# Patient Record
Sex: Male | Born: 1990 | Race: Black or African American | Hispanic: No | Marital: Single | Smoking: Current some day smoker
Health system: Southern US, Community
[De-identification: ages and names within clinical notes are randomized; demographics above are authoritative.]

## PROBLEM LIST (undated history)

## (undated) VITALS — BP 104/83 | HR 59 | Temp 98.9°F | Resp 16

## (undated) DIAGNOSIS — F209 Schizophrenia, unspecified: Secondary | ICD-10-CM

## (undated) DIAGNOSIS — S82142A Displaced bicondylar fracture of left tibia, initial encounter for closed fracture: Secondary | ICD-10-CM

## (undated) DIAGNOSIS — R7989 Other specified abnormal findings of blood chemistry: Secondary | ICD-10-CM

## (undated) HISTORY — PX: APPENDECTOMY: SHX54

## (undated) HISTORY — PX: NO PAST SURGERIES: SHX2092

---
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
## 2012-08-28 ENCOUNTER — Emergency Department (HOSPITAL_COMMUNITY)
Admission: EM | Admit: 2012-08-28 | Discharge: 2012-08-29 | Disposition: A | Discharge status: Other Acute Inpatient Hospital | Payer: Self-pay | Attending: Emergency Medicine | Admitting: Emergency Medicine

## 2012-08-28 ENCOUNTER — Encounter (HOSPITAL_COMMUNITY): Payer: Self-pay | Admitting: *Deleted

## 2012-08-28 DIAGNOSIS — F17200 Nicotine dependence, unspecified, uncomplicated: Secondary | ICD-10-CM | POA: Insufficient documentation

## 2012-08-28 DIAGNOSIS — R440 Auditory hallucinations: Secondary | ICD-10-CM

## 2012-08-28 DIAGNOSIS — F1210 Cannabis abuse, uncomplicated: Secondary | ICD-10-CM | POA: Insufficient documentation

## 2012-08-28 DIAGNOSIS — R443 Hallucinations, unspecified: Secondary | ICD-10-CM | POA: Insufficient documentation

## 2012-08-28 DIAGNOSIS — F911 Conduct disorder, childhood-onset type: Secondary | ICD-10-CM | POA: Insufficient documentation

## 2012-08-28 DIAGNOSIS — R456 Violent behavior: Secondary | ICD-10-CM

## 2012-08-28 LAB — RAPID URINE DRUG SCREEN, HOSP PERFORMED
Amphetamines: NOT DETECTED
Barbiturates: NOT DETECTED
Benzodiazepines: NOT DETECTED
Cocaine: NOT DETECTED
Opiates: NOT DETECTED
Tetrahydrocannabinol: NOT DETECTED

## 2012-08-28 LAB — ETHANOL: Alcohol, Ethyl (B): <11 mg/dL (ref 0–11)

## 2012-08-28 LAB — COMPREHENSIVE METABOLIC PANEL
ALT: 18 U/L (ref 0–53)
AST: 17 U/L (ref 0–37)
Albumin: 4.5 g/dL (ref 3.5–5.2)
Alkaline Phosphatase: 189 U/L — ABNORMAL HIGH (ref 39–117)
BUN: 11 mg/dL (ref 6–23)
CO2: 27 mEq/L (ref 19–32)
Calcium: 9.8 mg/dL (ref 8.4–10.5)
Chloride: 102 mEq/L (ref 96–112)
Creatinine, Ser: 0.49 mg/dL — ABNORMAL LOW (ref 0.50–1.35)
GFR calc Af Amer: >90 mL/min (ref >90)
GFR calc non Af Amer: >90 mL/min (ref >90)
Glucose, Bld: 91 mg/dL (ref 70–99)
Potassium: 3.6 mEq/L (ref 3.5–5.1)
Sodium: 140 mEq/L (ref 135–145)
Total Bilirubin: 0.3 mg/dL (ref 0.3–1.2)
Total Protein: 7.6 g/dL (ref 6.0–8.3)

## 2012-08-28 LAB — CBC
HCT: 38.6 % — ABNORMAL LOW (ref 39.0–52.0)
Hemoglobin: 13.4 g/dL (ref 13.0–17.0)
MCH: 29.3 pg (ref 26.0–34.0)
MCHC: 34.7 g/dL (ref 30.0–36.0)
MCV: 84.5 fL (ref 78.0–100.0)
Platelets: 288 10*3/uL (ref 150–400)
RBC: 4.57 MIL/uL (ref 4.22–5.81)
RDW: 12.1 % (ref 11.5–15.5)
WBC: 5.2 10*3/uL (ref 4.0–10.5)

## 2012-08-28 MED ORDER — ALUM & MAG HYDROXIDE-SIMETH 200-200-20 MG/5ML PO SUSP: 30.0000 mL | ORAL | Status: DC | PRN

## 2012-08-28 MED ORDER — ACETAMINOPHEN 325 MG PO TABS: 650.0000 mg | ORAL_TABLET | ORAL | Status: DC | PRN

## 2012-08-28 MED ORDER — RISPERIDONE 0.5 MG PO TABS
0.5000 mg | ORAL_TABLET | Freq: Every day | ORAL | Status: DC
  Administered 2012-08-28 – 2012-08-29 (×2): 0.5 mg via ORAL
  Filled 2012-08-28 (×2): qty 1

## 2012-08-28 MED ORDER — NICOTINE 7 MG/24HR TD PT24
7.0000 mg | MEDICATED_PATCH | Freq: Every day | TRANSDERMAL | Status: DC | PRN
  Filled 2012-08-28: qty 1

## 2012-08-28 MED ORDER — ZIPRASIDONE MESYLATE 20 MG IM SOLR: 10.0000 mg | Freq: Four times a day (QID) | INTRAMUSCULAR | Status: DC | PRN

## 2012-08-28 NOTE — ED Notes (Signed)
Pt up talking on the phone

## 2012-08-28 NOTE — ED Provider Notes (Signed)
History   This chart was scribed for [name], MD by [name], ED Scribe. The patient was seen in room TR09C/TR09C and the patient's care was started at 12:48PM.    CSN: [id number]  Arrival date & time 08/28/12  1159   None     Chief Complaint  Patient presents with  . Medical Clearance    (Consider location/radiation/quality/duration/timing/severity/associated sxs/prior treatment) The history is provided by the patient. No language interpreter was used.   [name] [name] is a 21 y.o. male who presents to the Emergency Department requesting medical clearance for his hallucinations today. He reports hearing voices for the past year. He came in today because he was playing video games and he "just lost it". He reports the voices have not gotten worse but he has more violent outbursts. He reports that the voices sounds like he can hear another's person thoughts or they are trying to control him. He has never been to a doctor or sought out professional help. He reports good sleep, normal appetite and fluid intake compared to baseline. He reports his twin brother has similar symptoms; his brother has been to the ED, was given medication, and was able to control it with medication. He denies the voices are telling him to harm himself or others and denies suicidal ideation or homicidal ideation. He reports he quit smoking marijuana 3 days ago. He used to smoke it everyday but just stopped. Smoking marijuana makes the voices worse. He does not regularly use any other drugs other than alcohol; he drinks socially at parties. He smokes cigarettes about a pack a day. Denies HA, visual deficits, fever, neck pain, sore throat, rash, back pain, CP, SOB, abdominal pain, nausea, emesis, diarrhea, dysuria, or extremity pain, edema, weakness, numbness, or tingling. 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 He reports he used to weigh more in the past but has lost some weight. No known  allergies. No other pertinent medical symptoms.  PCP: in [place]   History reviewed. No pertinent past medical history.  History reviewed. No pertinent past surgical history.  History reviewed. No pertinent family history.  Social History: He reports living intermittently between here in [place] with his father and [place] with his mother.  History  Substance Use Topics  . Smoking status: Current Every Day Smoker    Types: Cigarettes  . Smokeless tobacco: Not on file  . Alcohol Use: No      Review of Systems 10 Systems reviewed and are negative for acute change except as noted in the HPI.  Allergies  Review of patient's allergies indicates no known allergies.  Home Medications  No current outpatient prescriptions on file.  BP 109/97  Pulse 94  Temp 97.5 F (36.4 C) (Oral)  Resp 20  SpO2 100%  Physical Exam  Nursing notes reviewed.  Electronic medical record reviewed. VITAL SIGNS:   Filed Vitals:   08/28/12 1211 08/28/12 1838  BP: 109/97 113/77  Pulse: 94 78  Temp: 97.5 F (36.4 C) 98.2 F (36.8 C)  TempSrc: Oral Oral  Resp: 20 18  SpO2: 100% 100%   CONSTITUTIONAL: Awake, oriented, appears non-toxic HENT: Atraumatic, normocephalic, oral mucosa pink and moist, airway patent. Nares patent without drainage. External ears normal. EYES: Conjunctiva clear, EOMI, PERRLA NECK: Trachea midline, non-tender, supple CARDIOVASCULAR: Normal heart rate, Normal rhythm, No murmurs, rubs, gallops PULMONARY/CHEST: Clear to auscultation, no rhonchi, wheezes, or rales. Symmetrical breath sounds. Non-tender. ABDOMINAL: Non-distended, soft, non-tender - no rebound or guarding.  BS normal. NEUROLOGIC: Non-focal, moving all four extremities, no gross sensory or motor deficits. EXTREMITIES: No clubbing, cyanosis, or edema SKIN: Warm, Dry, No erythema, No rash  ED Course  Procedures (including critical care time)  DIAGNOSTIC STUDIES:  COORDINATION OF CARE:  12:53PM -  ACT team and telepsych will be ordered for [name] [name]. He will be moved to CDU.  Labs Reviewed  CBC - Abnormal; Notable for the following:    HCT 38.6 (*)     All other components within normal limits  COMPREHENSIVE METABOLIC PANEL  ETHANOL  URINE RAPID DRUG SCREEN (HOSP PERFORMED)   No results found.   No diagnosis found.    MDM  [name] [name] is a 21 y.o. male presenting with concern for schizophrenia. Patient will need formal tele-psych evaluation.  Patient is medically clear will be transferred to pod C. for further evaluation.  Discussed with the act team.  I personally performed the services described in this documentation, which was scribed in my presence. The recorded information has been reviewed and is accurate. [name], M.D.          [name], MD 08/28/12 2023

## 2012-08-28 NOTE — ED Notes (Signed)
Pt reports hearing voices x 1 year and "just wants them to go away." denies voices telling him to harm himself or others, denies SI or HI. Calm and cooperative at triage.

## 2012-08-28 NOTE — ED Notes (Signed)
The pt given meds

## 2012-08-28 NOTE — BH Assessment (Signed)
Assessment Note   [name] [name] is an 21 y.o. male presenting with AH without command and paranoid delusions.  Pt states he has been hearing "whispers" for the last year and fells that people are watching him and talking about him.  Pt denies SI/HI, VH at present.  Pt denies prior hx of MH tx.  Pt endorsing occasionally using THC and ETOH, specifically on social occasions.  Pt states the last time he used THC was 3 days ago and he "is trying to quit using weed".  Pt calm and cooperative during the assessment.  Pt presented with blunted, suspicious affect with unremarkable mood.  Pt alert and oriented x3.  Pt endorses living with his father and older sister while working full time in a [employer].  Pt's records indicate he has moved back and forth from [place] a number of times.  Pt states he has problems with his anger and feels like he "wants to fight a lot".  Pt denies any legal involvement.  Pt also endorses high energy levels but not change in mood (no elation or extreme happiness).  Telepsych completed, recommending inpatient care.  Referral made to [facility].    Axis I: Psychotic Disorder NOS and Rule Out Bipolar Disorder Axis II: Deferred Axis III:  Past Medical History  Diagnosis Date  . Mental disorder    Axis IV: housing problems, other psychosocial or environmental problems and problems related to social environment Axis V: 21-30 behavior considerably influenced by delusions or hallucinations OR serious impairment in judgment, communication OR inability to function in almost all areas  Past Medical History: History reviewed. No pertinent past medical history.  History reviewed. No pertinent past surgical history.  Family History: History reviewed. No pertinent family history.  Social History:  reports that he has been smoking Cigarettes.  He does not have any smokeless tobacco history on file. He reports that he drinks alcohol. He reports that he uses illicit drugs  (Marijuana).  Additional Social History:  Alcohol / Drug Use History of alcohol / drug use?: Yes Substance #1 Name of Substance 1: THC 1 - Age of First Use: 19 1 - Amount (size/oz): varies 1 - Frequency: varies 1 - Duration: unk 1 - Last Use / Amount: 3 days  CIWA: CIWA-Ar BP: 109/97 mmHg Pulse Rate: 94  COWS:    Allergies: No Known Allergies  Home Medications:  (Not in a hospital admission)  OB/GYN Status:  No LMP for male patient.  General Assessment Data Location of Assessment: [facility] ED ACT Assessment: Yes Living Arrangements: Parent Can pt return to current living arrangement?: Yes Admission Status: Voluntary Is patient capable of signing voluntary admission?: Yes Transfer from: Home Referral Source: Self/Family/Friend     Risk to self Suicidal Ideation: No Suicidal Intent: No Is patient at risk for suicide?: No Suicidal Plan?: No Access to Means: No What has been your use of drugs/alcohol within the last 12 months?: THC usage Previous Attempts/Gestures: No How many times?: 0  Other Self Harm Risks: psychosis Triggers for Past Attempts: None known Intentional Self Injurious Behavior: None Family Suicide History: No Recent stressful life event(s): Other (Comment) (none noted) Persecutory voices/beliefs?: No Depression: No Depression Symptoms:  (none noted) Substance abuse history and/or treatment for substance abuse?: Yes (THC) Suicide prevention information given to non-admitted patients: Not applicable  Risk to Others Homicidal Ideation: No-Not Currently/Within Last 6 Months Thoughts of Harm to Others: No-Not Currently Present/Within Last 6 Months Current Homicidal Intent: No-Not Currently/Within Last 6 Months Current  Homicidal Plan: No-Not Currently/Within Last 6 Months Access to Homicidal Means: No Identified Victim: none History of harm to others?: No Assessment of Violence: None Noted Violent Behavior Description: fighting in distant past Does  patient have access to weapons?: No Criminal Charges Pending?: No Does patient have a court date: No  Psychosis Hallucinations: Auditory (whispers for the last year) Delusions: Persecutory (feeling that people are watching/talking about him)  Mental Status Report Appear/Hygiene: Improved Eye Contact: Fair Motor Activity: Unremarkable Speech: Logical/coherent Level of Consciousness: Alert Mood: Preoccupied Affect: Blunted;Preoccupied Anxiety Level: None Thought Processes: Coherent;Relevant Judgement: Impaired Orientation: Person;Place;Situation;Time Obsessive Compulsive Thoughts/Behaviors: Moderate  Cognitive Functioning Concentration: Decreased Memory: Recent Intact;Remote Intact IQ: Average Insight: Fair Impulse Control: Poor Appetite: Fair Weight Loss: 0  Weight Gain: 0  Sleep: Decreased Total Hours of Sleep: 6  Vegetative Symptoms: None  ADLScreening [facility] Assessment Services) Patient's cognitive ability adequate to safely complete daily activities?: Yes Patient able to express need for assistance with ADLs?: Yes Independently performs ADLs?: Yes (appropriate for developmental age)  Abuse/Neglect [facility]) Physical Abuse: Denies Verbal Abuse: Denies Sexual Abuse: Denies  Prior Inpatient Therapy Prior Inpatient Therapy: No Prior Therapy Dates: none Prior Therapy Facilty/Provider(s): none Reason for Treatment: none  Prior Outpatient Therapy Prior Outpatient Therapy: No Prior Therapy Dates: none Prior Therapy Facilty/Provider(s): none Reason for Treatment: none  ADL Screening (condition at time of admission) Patient's cognitive ability adequate to safely complete daily activities?: Yes Patient able to express need for assistance with ADLs?: Yes Independently performs ADLs?: Yes (appropriate for developmental age)       Abuse/Neglect Assessment (Assessment to be complete while patient is alone) Physical Abuse: Denies Verbal Abuse: Denies Sexual Abuse:  Denies Exploitation of patient/patient's resources: Denies Self-Neglect: Denies Values / Beliefs Cultural Requests During Hospitalization: None Spiritual Requests During Hospitalization: None        Additional Information 1:1 In Past 12 Months?: No CIRT Risk: No Elopement Risk: No Does patient have medical clearance?: Yes     Disposition:  Disposition Disposition of Patient: Referred to Patient referred to: Other (Comment) [name])  On Site Evaluation by:   Reviewed with Physician:     Ena Dawley Pate 08/28/2012 5:03 PM

## 2012-08-29 ENCOUNTER — Encounter (HOSPITAL_COMMUNITY): Payer: Self-pay | Admitting: *Deleted

## 2012-08-29 ENCOUNTER — Inpatient Hospital Stay (HOSPITAL_COMMUNITY)
Admission: AD | Admit: 2012-08-29 | Discharge: 2012-09-01 | DRG: 897 | Disposition: A | Discharge status: Home / Self Care | Payer: Federal, State, Local not specified - Other | Source: Ambulatory Visit | Attending: Psychiatry | Admitting: Psychiatry

## 2012-08-29 DIAGNOSIS — F1910 Other psychoactive substance abuse, uncomplicated: Secondary | ICD-10-CM

## 2012-08-29 DIAGNOSIS — F19950 Other psychoactive substance use, unspecified with psychoactive substance-induced psychotic disorder with delusions: Principal | ICD-10-CM | POA: Diagnosis present

## 2012-08-29 DIAGNOSIS — F1920 Other psychoactive substance dependence, uncomplicated: Secondary | ICD-10-CM | POA: Diagnosis present

## 2012-08-29 NOTE — ED Provider Notes (Signed)
Pt alert, content, vitals normal. Discussed w act team, awaiting placement.   Suzi Roots, MD 08/29/12 1004

## 2012-08-29 NOTE — BH Assessment (Signed)
[facility] Assessment Progress Note      Pt was accepted to [facility] by Donell Sievert to Dr Jannifer Franklin.  Room 401-2.  Support paperwork complete.  ED staff notified.

## 2012-08-29 NOTE — BH Assessment (Signed)
Assessment Note   [name] [name] is an 21 y.o. male that was reassessed this day.  Pt continues to endorse psychosis (auditory hallucinations).  Pt denies SI or HI.  Pt pleasant and cooperative and just took a shower.  Pt asked questions about his care and writer explained the process to the pt.  Pt did appear al little apprehensive and suspicious, but was calm and cooperative.  Telepsych recommended inpatient care.  There are no appropriate beds at [facility] tonight.  [place], no beds per [place] at 1554.  Called [facility] and per [place] @ 1550, beds available so referral faxed for review.  Called [place] and no beds per [place] at 1553.  Called [facility], and no indigent [place] beds per [employer] at 1553.  Called [facility] and left message at 1802.  Called [facility] and per Lupita Leash at 1800, beds avaialable, so referral faxed for review.  Oncoming staff to follow up with referrals.  Completed reassessment, assessment notification and faxed to [facility] to log.  Updated ED staff.  Previous Note:  [name] [name] is an 21 y.o. male presenting with AH without command and paranoid delusions. Pt states he has been hearing "whispers" for the last year and fells that people are watching him and talking about him. Pt denies SI/HI, VH at present. Pt denies prior hx of MH tx. Pt endorsing occasionally using THC and ETOH, specifically on social occasions. Pt states the last time he used THC was 3 days ago and he "is trying to quit using weed". Pt calm and cooperative during the assessment. Pt presented with blunted, suspicious affect with unremarkable mood. Pt alert and oriented x3. Pt endorses living with his father and older sister while working full time in a [employer]. Pt's records indicate he has moved back and forth from [place] a number of times. Pt states he has problems with his anger and feels like he "wants to fight a lot". Pt denies any legal involvement. Pt also endorses high energy levels but not change in mood (no elation or  extreme happiness). Telepsych completed, recommending inpatient care. Referral made to [facility].   Axis I: 298.9 Psychotic Disorder NOS Axis II: Deferred Axis III:  Past Medical History  Diagnosis Date  . Mental disorder    Axis IV: housing problems, other psychosocial or environmental problems, problems related to social environment and problems with access to health care services Axis V: 21-30 behavior considerably influenced by delusions or hallucinations OR serious impairment in judgment, communication OR inability to function in almost all areas  Past Medical History:  Past Medical History  Diagnosis Date  . Mental disorder     Past Surgical History  Procedure Date  . No past surgeries     Family History: History reviewed. No pertinent family history.  Social History:  reports that he has been smoking Cigarettes.  He does not have any smokeless tobacco history on file. He reports that he drinks alcohol. He reports that he uses illicit drugs (Marijuana).  Additional Social History:  Alcohol / Drug Use Pain Medications: see MAR Prescriptions: see MAR Over the Counter: see MAR History of alcohol / drug use?: Yes Longest period of sobriety (when/how long): unknown Negative Consequences of Use: Personal relationships Withdrawal Symptoms:  (pt denies) Substance #1 Name of Substance 1: THC 1 - Age of First Use: 19 1 - Amount (size/oz): varies 1 - Frequency: varies 1 - Duration: unk 1 - Last Use / Amount: 3 days  CIWA: CIWA-Ar BP: 115/74 mmHg Pulse  Rate: 67  COWS:    Allergies: No Known Allergies  Home Medications:  (Not in a hospital admission)  OB/GYN Status:  No LMP for male patient.  General Assessment Data Location of Assessment: [facility] ED ACT Assessment: Yes Living Arrangements: Parent Can pt return to current living arrangement?: Yes Admission Status: Voluntary Is patient capable of signing voluntary admission?: Yes Transfer from: Home Referral Source:  Self/Family/Friend  Education Status Is patient currently in school?: No  Risk to self Suicidal Ideation: No Suicidal Intent: No Is patient at risk for suicide?: No Suicidal Plan?: No Access to Means: No What has been your use of drugs/alcohol within the last 12 months?: THC use Previous Attempts/Gestures: No How many times?: 0  Other Self Harm Risks: psychosis Triggers for Past Attempts: None known Intentional Self Injurious Behavior: None Family Suicide History: No Recent stressful life event(s): Other (Comment) (none noted) Persecutory voices/beliefs?: No Depression: No Depression Symptoms:  (pt denies, none noted) Substance abuse history and/or treatment for substance abuse?: Yes (THC) Suicide prevention information given to non-admitted patients: Not applicable  Risk to Others Homicidal Ideation: No Thoughts of Harm to Others: No Current Homicidal Intent: No Current Homicidal Plan: No Access to Homicidal Means: No Identified Victim: pt denies History of harm to others?: No Assessment of Violence: None Noted Violent Behavior Description: Fighting in distant past Does patient have access to weapons?: No Criminal Charges Pending?: No Does patient have a court date: No  Psychosis Hallucinations: Auditory (whispers for the last year) Delusions: Persecutory (feeling people are watching/talking about him)  Mental Status Report Appear/Hygiene: Improved Eye Contact: Fair Motor Activity: Unremarkable Speech: Logical/coherent Level of Consciousness: Alert Mood: Apprehensive Affect: Blunted;Appropriate to circumstance Anxiety Level: None Thought Processes: Coherent;Relevant Judgement: Impaired Orientation: Person;Place;Situation;Time Obsessive Compulsive Thoughts/Behaviors: Minimal  Cognitive Functioning Concentration: Decreased Memory: Recent Intact;Remote Intact IQ: Average Insight: Fair Impulse Control: Poor Appetite: Fair Weight Loss: 0  Weight Gain: 0   Sleep: Decreased Total Hours of Sleep: 6  Vegetative Symptoms: None  ADLScreening [facility] Assessment Services) Patient's cognitive ability adequate to safely complete daily activities?: Yes Patient able to express need for assistance with ADLs?: Yes Independently performs ADLs?: Yes (appropriate for developmental age)  Abuse/Neglect [facility]) Physical Abuse: Denies Verbal Abuse: Denies Sexual Abuse: Denies  Prior Inpatient Therapy Prior Inpatient Therapy: No Prior Therapy Dates: none Prior Therapy Facilty/Provider(s): none Reason for Treatment: none  Prior Outpatient Therapy Prior Outpatient Therapy: No Prior Therapy Dates: none Prior Therapy Facilty/Provider(s): none Reason for Treatment: none  ADL Screening (condition at time of admission) Patient's cognitive ability adequate to safely complete daily activities?: Yes Patient able to express need for assistance with ADLs?: Yes Independently performs ADLs?: Yes (appropriate for developmental age) Weakness of Legs: None Weakness of Arms/Hands: None  Home Assistive Devices/Equipment Home Assistive Devices/Equipment: None    Abuse/Neglect Assessment (Assessment to be complete while patient is alone) Physical Abuse: Denies Verbal Abuse: Denies Sexual Abuse: Denies Exploitation of patient/patient's resources: Denies Self-Neglect: Denies Values / Beliefs Cultural Requests During Hospitalization: None Spiritual Requests During Hospitalization: None Consults Spiritual Care Consult Needed: No Social Work Consult Needed: No Merchant [employer] officer (For Healthcare) Advance Directive: Patient does not have advance directive;Patient would not like information    Additional Information 1:1 In Past 12 Months?: No CIRT Risk: No Elopement Risk: No Does patient have medical clearance?: Yes     Disposition:  Disposition Disposition of Patient: Referred to;Inpatient treatment program Type of inpatient treatment program:  Adult Patient referred to: Other (Comment) (Pending [facility],  Clotilde Dieter)  On Site Evaluation by:   Reviewed with Physician:  Valene Bors, Rennis Harding 08/29/2012 6:03 PM

## 2012-08-29 NOTE — ED Notes (Signed)
Breakfast tray given. °

## 2012-08-30 ENCOUNTER — Encounter (HOSPITAL_COMMUNITY): Payer: Self-pay | Admitting: Psychiatry

## 2012-08-30 DIAGNOSIS — F1910 Other psychoactive substance abuse, uncomplicated: Secondary | ICD-10-CM

## 2012-08-30 DIAGNOSIS — F19950 Other psychoactive substance use, unspecified with psychoactive substance-induced psychotic disorder with delusions: Principal | ICD-10-CM

## 2012-08-30 LAB — TSH: TSH: 2.905 u[IU]/mL (ref 0.350–4.500)

## 2012-08-30 MED ORDER — CHLORDIAZEPOXIDE HCL 25 MG PO CAPS: 25.0000 mg | ORAL_CAPSULE | Freq: Four times a day (QID) | ORAL | Status: DC | PRN

## 2012-08-30 MED ORDER — BENZTROPINE MESYLATE 0.5 MG PO TABS
0.5000 mg | ORAL_TABLET | Freq: Two times a day (BID) | ORAL | Status: DC
  Administered 2012-08-30 – 2012-09-01 (×4): 0.5 mg via ORAL
  Filled 2012-08-30 (×6): qty 1

## 2012-08-30 MED ORDER — TRAZODONE HCL 50 MG PO TABS: 50.0000 mg | ORAL_TABLET | Freq: Every evening | ORAL | Status: DC | PRN

## 2012-08-30 MED ORDER — HALOPERIDOL 2 MG PO TABS
2.0000 mg | ORAL_TABLET | Freq: Two times a day (BID) | ORAL | Status: DC
  Administered 2012-08-30 – 2012-09-01 (×4): 2 mg via ORAL
  Filled 2012-08-30 (×6): qty 1

## 2012-08-30 MED ORDER — NICOTINE 21 MG/24HR TD PT24
21.0000 mg | MEDICATED_PATCH | Freq: Every day | TRANSDERMAL | Status: DC
  Administered 2012-09-01: 21 mg via TRANSDERMAL
  Filled 2012-08-30 (×4): qty 1

## 2012-08-30 MED ORDER — MAGNESIUM HYDROXIDE 400 MG/5ML PO SUSP: 30.0000 mL | Freq: Every day | ORAL | Status: DC | PRN

## 2012-08-30 MED ORDER — ALUM & MAG HYDROXIDE-SIMETH 200-200-20 MG/5ML PO SUSP: 30.0000 mL | ORAL | Status: DC | PRN

## 2012-08-30 MED ORDER — RISPERIDONE 1 MG PO TABS
ORAL_TABLET | ORAL | Status: AC
  Filled 2012-08-30: qty 1

## 2012-08-30 MED ORDER — RISPERIDONE 1 MG PO TABS
1.0000 mg | ORAL_TABLET | Freq: Every day | ORAL | Status: DC
  Administered 2012-08-30: 1 mg via ORAL
  Filled 2012-08-30 (×2): qty 1

## 2012-08-30 MED ORDER — ACETAMINOPHEN 325 MG PO TABS: 650.0000 mg | ORAL_TABLET | Freq: Four times a day (QID) | ORAL | Status: DC | PRN

## 2012-08-30 NOTE — Progress Notes (Signed)
[facility] Group Notes:  (Counselor/Nursing/MHT/Case Management/Adjunct)  08/30/2012 6:52 PM  Type of Therapy:  Discharge Planning  Participation Level:  Minimal  Participation Quality:  Attentive  Affect:  Flat  Cognitive:  Oriented  Insight:  None shared  Engagement in Group:  Limited  Engagement in Therapy:  Limited  Modes of Intervention:  Exploration, Socialization and Support  Summary of Progress/Problems:  21 YO Patient shared that voices brought him into hospital and this was an entirely new experience for him. He reported no suicidal or homicidal ideation nor depression and rated his anxiety at a 4 on a scale of 1-10 with 10 being the highest.    Clide Dales 08/30/2012, 6:52 PM

## 2012-08-30 NOTE — Tx Team (Signed)
Initial Interdisciplinary Treatment Plan  PATIENT STRENGTHS: (choose at least two) Ability for insight Average or above average intelligence Communication skills General fund of knowledge Motivation for treatment/growth Physical Health Supportive family/friends Work skills  PATIENT STRESSORS: Substance abuse   PROBLEM LIST: Problem List/Patient Goals Date to be addressed Date deferred Reason deferred Estimated date of resolution  Substance Abuse 08-29-12     Auditory Hallucinations 08-29-12                                                DISCHARGE CRITERIA:  Improved stabilization in mood, thinking, and/or behavior Motivation to continue treatment in a less acute level of care Verbal commitment to aftercare and medication compliance  PRELIMINARY DISCHARGE PLAN: Attend PHP/IOP Outpatient therapy Participate in family therapy Return to previous living arrangement Return to previous work or school arrangements  PATIENT/FAMIILY INVOLVEMENT: This treatment plan has been presented to and reviewed with the patient, [name] [name], and/or family member.  The patient and family have been given the opportunity to ask questions and make suggestions.  Mickeal Needy 08/30/2012, 12:11 AM

## 2012-08-30 NOTE — [facility] Counselor (Signed)
Adult Comprehensive Assessment  Patient ID: [name] [name], male   DOB: 01-Jan-1991, 21 y.o.   MRN: [id number]  Information Source:    Current Stressors:  Educational / Learning stressors: N/A Employment / Job issues: Having difficulty at work due to hearing voices Family Relationships: N/A Surveyor, quantity / Lack of resources (include bankruptcy): Depends on parents for support as he is only working part Conservation officer, historic buildings / Lack of housing: Depends on parents for housing Physical health (include injuries & life threatening diseases): N/A Social relationships: N/A Substance abuse: Claims to be drug free for at least 2 weeks.  Admits that this has been a problem in the past, but blieves he is able to just stop based on the past 2 weeks Bereavement / Loss: N/A  Living/Environment/Situation:  Living Arrangements: Parent Living conditions (as described by patient or guardian): Good How long has patient lived in current situation?: 1 month  Has been back and forth between father here and mother in [place] multiple times since HS graduation What is atmosphere in current home: Comfortable;Supportive  Family History:  Marital status: Single Does patient have children?: No  Childhood History:  By whom was/is the patient raised?: Mother;Father Additional childhood history information: Lived with mother thru 8th grade, then he and twind moved in with father here for HS Description of patient's relationship with caregiver when they were a child: Good Patient's description of current relationship with people who raised him/her: Good Number of Siblings: 2  (Twin brother living with mother, 92 yo sister also with moth) Description of patient's current relationship with siblings: Good Did patient suffer any verbal/emotional/physical/sexual abuse as a child?: No Did patient suffer from severe childhood neglect?: No Has patient ever been sexually abused/assaulted/raped as an adolescent or adult?: No Was the  patient ever a victim of a crime or a disaster?: No Witnessed domestic violence?: No Has patient been effected by domestic violence as an adult?: No  Education:  Highest grade of school patient has completed: 12 Currently a Consulting civil engineer?: No Learning disability?: No  Employment/Work Situation:   Employment situation: Employed Where is patient currently employed?: [employer] How long has patient been employed?: 1 month Patient's job has been impacted by current illness: Yes Describe how patient's job has been implacted: Not able to concentrate as much as he would like due to hearing voices What is the longest time patient has a held a job?: 6 months Where was the patient employed at that time?: [employer] Has patient ever been in the [employer]?: No Has patient ever served in combat?: No  Financial Resources:   Financial resources: Income from employment;Support from parents / caregiver Does patient have a representative payee or guardian?: No  Alcohol/Substance Abuse:   What has been your use of drugs/alcohol within the last 12 months?: Experimented with Xanax, acid, Adderal, Mollies, Ecstacy, Weed Alcohol/Substance Abuse Treatment Hx: Denies past history Has alcohol/substance abuse ever caused legal problems?: Yes (No legal, but he was fired from the [employer] for substa)  Social Support System:   Patient's Community Support System: Good Describe Community Support System: Family, friends Type of faith/religion: Ephriam Knuckles How does patient's faith help to cope with current illness?: N/A  Leisure/Recreation:   Leisure and Hobbies: Skateboarding  Strengths/Needs:   What things does the patient do well?: Skateboarding, playing video games In what areas does patient struggle / problems for patient: Wants to be better skateboarder  Discharge Plan:   Does patient have access to transportation?: Yes Will patient be  returning to same living situation after discharge?:  Yes Currently receiving community mental health services: No If no, would patient like referral for services when discharged?: Yes (What county?) Medical sales representative)  Summary/Recommendations:   Summary and Recommendations (to be completed by the evaluator): [name] [name] is a 21 yo AA male who is here due to psychosis.  "For the most part, the voices are manageable.  I just want to see if there is a medication that takes them away completely."  Will benefit from medicaiton trial, therapuetic milieu, and follow up appointment in the community.  [name] [name]. 08/30/2012

## 2012-08-30 NOTE — H&P (Addendum)
Psychiatric Admission Assessment Adult  Patient Identification:  [name] [name] Date of Evaluation:  08/30/2012 Chief Complaint:  Psychotic Disorder NOS   298.9 History of Present Illness:Patient is a 68 year man with no prior history of mental illness who reports long history of polysubstance abuse(Mushroom, Cannabis,Molly, Spice, Adderall, Xanax etc). Patient presents with auditory hallucinations, paranois and violent outburst. He stated that he has been hearing  Voices for over a year after he started to smoke Marijuana and doing other drugs. He reports hearing voices in his head "trying to control my thoughts". Patient reports that he has tried to stop using drugs thinking that the voices will disappear without success. He is requesting for help to get rid of the voices in his head. Elements:  Location:  [facility] inpatient service. Quality:  hearing voices that has been impairing his daily function.. Severity:  voices are there all the time and becomes more severe when he uses drugs. Timing:  hearing voices and  feels more paranoid when using drugs.. Duration:  voices and paranoia started over a year ago.. Context:  voices started with drug use.. Associated Signs/Synptoms: Depression Symptoms:  denies (Hypo) Manic Symptoms:  Irritable Mood, Anxiety Symptoms:  None reported Psychotic Symptoms:  Delusions, Hallucinations: Auditory PTSD Symptoms: none reported.  Psychiatric Specialty Exam: Physical Exam  Psychiatric: He has a normal mood and affect. His speech is normal. He is agitated and actively hallucinating. Thought content is paranoid and delusional. Cognition and memory are normal. He expresses inappropriate judgment.  ROS: reviewed ROS, and CPE completed by MD in ED and evaluated the patient. I agree with those findings and feel no further physical exam is warranted at this time.  Review of Systems  Constitutional: Negative.   HENT: Negative.   Eyes: Negative.   Respiratory:  Negative.   Cardiovascular: Negative.   Gastrointestinal: Negative.   Genitourinary: Negative.   Musculoskeletal: Negative.   Skin: Negative.   Neurological: Negative.   Endo/Heme/Allergies: Negative.   Psychiatric/Behavioral: Positive for hallucinations.    Blood pressure 123/91, pulse 125, temperature 96.9 F (36.1 C), temperature source Oral, resp. rate 20, height 5\' 3"  (1.6 m), weight 68.947 kg (152 lb).Body mass index is 26.93 kg/(m^2).  General Appearance: Casual and Fairly Groomed  Patent attorney::  Good  Speech:  Clear and Coherent  Volume:  Normal  Mood:  neutral  Affect:  Restricted  Thought Process:  Linear and Logical  Orientation:  Full (Time, Place, and Person)  Thought Content:  Delusions and Hallucinations: Auditory  Suicidal Thoughts:  No  Homicidal Thoughts:  No  Memory:  Immediate;   Fair Recent;   Fair Remote;   Fair  Judgement:  Impaired  Insight:  Lacking  Psychomotor Activity:  Normal  Concentration:  Fair  Recall:  Fair  Akathisia:  No  Handed:  Right  AIMS (if indicated):     Assets:  Communication Skills Desire for Improvement Physical Health  Sleep:  Number of Hours: 4.5     Past Psychiatric History: Diagnosis:  Hospitalizations:  None  Outpatient Care:   None  Substance Abuse Care:  None  Self-Mutilation:  None  Suicidal Attempts:  None  Violent Behaviors:  None    Past Medical History:   Past Medical History  Diagnosis Date  . Mental disorder    None. Allergies:  No Known Allergies PTA Medications: No prescriptions prior to admission    Previous Psychotropic Medications:  Medication/Dose: none  Substance Abuse History in the last 12 months:  Yes. Patient endorses using marijuana daily for the past year with his last use being 1 month ago. He also reports trying LSD, Pills, Mollie, Adderall and Ecstasy.  He also reports having 1-2 beers several times a month.  Consequences of Substance  Abuse: NA Patient states marijuana made the voices worse.  Even though he last used a month ago, he states he was still feeling the affects of this when he came to the hospital. Social History:  reports that he has been smoking Cigarettes.  He does not have any smokeless tobacco history on file. He reports that he drinks alcohol. He reports that he uses illicit drugs (Marijuana). Additional Social History: Current Place of Residence:   Place of Birth:   Family Members: Marital Status:  Single Children:  Sons:  Daughters: Relationships: Patient does not recall his parents living together but thinks they split when he was 39 or 21 years old.  He has traveled back and forth between parents for the majority of his life. He moved to [facility] when he was in the 8th grade. Education:  HS Graduate [employer]. Educational Problems/Performance: Religious Beliefs/Practices: History of Abuse (Emotional/Phsycial/Sexual) Occupational Experiences; Military History:  None. Legal History: Hobbies/Interests:  Family History:   Family History  Problem Relation Age of Onset  . Other Mother   . Other Brother     Results for orders placed during the hospital encounter of 08/28/12 (from the past 72 hour(s))  CBC     Status: Abnormal   Collection Time   08/28/12 12:19 PM      Component Value Range Comment   WBC 5.2  4.0 - 10.5 K/uL    RBC 4.57  4.22 - 5.81 MIL/uL    Hemoglobin 13.4  13.0 - 17.0 g/dL    HCT 16.1 (*) 09.6 - 52.0 %    MCV 84.5  78.0 - 100.0 fL    MCH 29.3  26.0 - 34.0 pg    MCHC 34.7  30.0 - 36.0 g/dL    RDW 04.5  40.9 - 81.1 %    Platelets 288  150 - 400 K/uL   COMPREHENSIVE METABOLIC PANEL     Status: Abnormal   Collection Time   08/28/12 12:19 PM      Component Value Range Comment   Sodium 140  135 - 145 mEq/L    Potassium 3.6  3.5 - 5.1 mEq/L    Chloride 102  96 - 112 mEq/L    CO2 27  19 - 32 mEq/L    Glucose, Bld 91  70 - 99 mg/dL    BUN 11  6 - 23 mg/dL    Creatinine, Ser  9.14 (*) 0.50 - 1.35 mg/dL    Calcium 9.8  8.4 - 78.2 mg/dL    Total Protein 7.6  6.0 - 8.3 g/dL    Albumin 4.5  3.5 - 5.2 g/dL    AST 17  0 - 37 U/L    ALT 18  0 - 53 U/L    Alkaline Phosphatase 189 (*) 39 - 117 U/L    Total Bilirubin 0.3  0.3 - 1.2 mg/dL    GFR calc non Af Amer >90  >90 mL/min    GFR calc Af Amer >90  >90 mL/min   ETHANOL     Status: Normal   Collection Time   08/28/12 12:19 PM      Component Value Range Comment   Alcohol,  Ethyl (B) <11  0 - 11 mg/dL   URINE RAPID DRUG SCREEN (HOSP PERFORMED)     Status: Normal   Collection Time   08/28/12  3:10 PM      Component Value Range Comment   Opiates NONE DETECTED  NONE DETECTED    Cocaine NONE DETECTED  NONE DETECTED    Benzodiazepines NONE DETECTED  NONE DETECTED    Amphetamines NONE DETECTED  NONE DETECTED    Tetrahydrocannabinol NONE DETECTED  NONE DETECTED    Barbiturates NONE DETECTED  NONE DETECTED    Psychological Evaluations:  Assessment:   AXIS I:  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 AXIS II:  Deferred AXIS III:   Past Medical History  Diagnosis Date  . Mental disorder    AXIS IV:  other psychosocial or environmental problems AXIS V:  21-30 behavior considerably influenced by delusions or hallucinations OR serious impairment in judgment, communication OR inability to function in almost all areas  Treatment Plan/Recommendations:  1. Admit for crisis management and stabilization. 2. Medication management to reduce current symptoms to base line and improve the patient's overall level of functioning 3. Treat health problems as indicated. 4. Develop treatment plan to decrease risk of relapse upon discharge and the need for readmission. 5. Psycho-social education regarding relapse prevention and self care. 6. Health care follow up as needed for medical problems. 7. Restart home medications where appropriate.    Treatment Plan Summary: Daily contact with patient to assess and evaluate  symptoms and progress in treatment Medication management Current Medications:  Current Facility-Administered Medications  Medication Dose Route Frequency Provider Last Rate Last Dose  . acetaminophen (TYLENOL) tablet 650 mg  650 mg Oral Q6H PRN Kerry Hough, PA      . alum & mag hydroxide-simeth (MAALOX/MYLANTA) 200-200-20 MG/5ML suspension 30 mL  30 mL Oral Q4H PRN Kerry Hough, PA      . chlordiazePOXIDE (LIBRIUM) capsule 25 mg  25 mg Oral QID PRN Kerry Hough, PA      . magnesium hydroxide (MILK OF MAGNESIA) suspension 30 mL  30 mL Oral Daily PRN Kerry Hough, PA      . nicotine (NICODERM CQ - dosed in mg/24 hours) patch 21 mg  21 mg Transdermal Q0600 Kerry Hough, PA      . [COMPLETED] risperiDONE (RISPERDAL) 1 MG tablet           . risperiDONE (RISPERDAL) tablet 1 mg  1 mg Oral QHS Kerry Hough, PA   1 mg at 08/30/12 0031   Facility-Administered Medications Ordered in Other Encounters  Medication Dose Route Frequency Provider Last Rate Last Dose  . [DISCONTINUED] acetaminophen (TYLENOL) tablet 650 mg  650 mg Oral Q4H PRN John-Adam Bonk, MD      . [DISCONTINUED] alum & mag hydroxide-simeth (MAALOX/MYLANTA) 200-200-20 MG/5ML suspension 30 mL  30 mL Oral PRN John-Adam Bonk, MD      . [DISCONTINUED] nicotine (NICODERM CQ - dosed in mg/24 hr) patch 7 mg  7 mg Transdermal Daily PRN John-Adam Bonk, MD      . [DISCONTINUED] risperiDONE (RISPERDAL) tablet 0.5 mg  0.5 mg Oral QHS Ankit Nanavati, MD   0.5 mg at 08/29/12 2143  . [DISCONTINUED] ziprasidone (GEODON) injection 10 mg  10 mg Intramuscular Q6H PRN John-Adam Bonk, MD        Observation Level/Precautions:  routine  Laboratory:  routine  Psychotherapy:    Medications:    Consultations:    Discharge Concerns:  Estimated LOS:  Other:     I certify that inpatient services furnished can reasonably be expected to improve the patient's condition.   Akintayo, Mojeed,MD 12/10/201311:20 AM

## 2012-08-30 NOTE — [facility] Suicide Risk Assessment (Signed)
Suicide Risk Assessment  Admission Assessment     Nursing information obtained from:  Patient Demographic factors:  Male;Adolescent or young adult Current Mental Status:  NA Loss Factors:  NA Historical Factors:  Prior suicide attempts;Family history of mental illness or substance abuse Risk Reduction Factors:  Sense of responsibility to family  CLINICAL FACTORS:   Currently Psychotic  COGNITIVE FEATURES THAT CONTRIBUTE TO RISK:  Polarized thinking    SUICIDE RISK:   Minimal: No identifiable suicidal ideation.  Patients presenting with no risk factors but with morbid ruminations; may be classified as minimal risk based on the severity of the depressive symptoms  PLAN OF CARE:1. Admit for crisis management and stabilization. 2. Medication management to reduce current symptoms to base line and improve the patient's overall level of functioning 3. Treat health problems as indicated. 4. Develop treatment plan to decrease risk of relapse upon discharge and the need for readmission. 5. Psycho-social education regarding relapse prevention and self care. 6. Health care follow up as needed for medical problems. 7. Restart home medications where appropriate.     Thedore Mins, MD 08/30/2012, 11:19 AM

## 2012-08-30 NOTE — Progress Notes (Signed)
The focus of this group is to help patients review their daily goal of treatment and discuss progress on daily workbooks. Pt attended the evening session and participated in the wrap-up discussion when prompted by the Writer. Pt reported that he had a good conversation with his doctor today and that he thinks that his new medications will help him.

## 2012-08-30 NOTE — Progress Notes (Signed)
D: Pt denies SI/HI. Pt endorse auditory hallucinations. Pt reported that the voices come and go throughout the day. Pt reported that he do not act on what the voices say. He reported that the voices tell him random things, nothing harmful. Pt reported that the voices do not tell him bad things but he is tired of hearing voices and is now seeking help for treatment  after hearing voices for 1 year. Pt reported that this is his first time in a psych facility. Pt denies pain and show no s/s of distress. Pt reported sleeping well last night. Pt is cooperative and have been attending groups.  A: Shift assessment performed. Verbal support given. Pt encouraged to continue to attend groups and participate. 15 minute checks performed for safety.  R: Flat affect. Pleasant. Seeking help for treatment.

## 2012-08-30 NOTE — Progress Notes (Signed)
[facility] Group Notes:  (Counselor/Nursing/MHT/Case Management/Adjunct)  08/30/2012 6:45 PM  Type of Therapy:  Group Therapy at 11:00AM  Participation Level:  Active  Participation Quality:  Attentive  Affect:  Flat  Cognitive:  Alert and Oriented  Insight:  Limited  Engagement in Group:  Limited  Engagement in Therapy:  Limited  Modes of Intervention:  Clarification, Exploration and Support  Summary of Progress/Problems:  Group activity involved group members choosing a question to ask from the "ungame" and later sharing answer to "What is one thing you would change in your life?"  Patient shared he would like to "turn off the voices in my head."  Question patient drew referred to feelings one gets when news bulletin comes on and patient shared it did not affect him in any way.  Patient sat with head down looking at floor unless actively engaged in conversation.    Clide Dales 08/30/2012, 6:45 PM

## 2012-08-30 NOTE — Progress Notes (Signed)
Patient ID: [name] [name], male   DOB: 1991-07-10, 21 y.o.   MRN: [id number] Pt. Is 21 yo male admitted for c/o "hearing voices", "hearing things it's from the marijuana" Pt. Reports he's been hearing voice for a year. Pt. Also reports "I have done other drugs, pills, adderral, xanax, screws, mushrooms" Pt. Reports he's been in [place] for 2 months moved here from [place] lives with dad.  Pt. Reports voices are not harmful, but they slow him down. Pt. Clearly preoccupied during admission process, looking around, mumbling under breath at times, pt. Seems to be agitated due to voices. Pt. Reports no prior hospitalizations anywhere and none at [facility], but reports brother has been a pt. Here for hearing voices and he has visited his brother while he was inpatient at [facility].  Writer reviewed admission instructions and fall risk. Pt. Oriented to hall and room, Pt. Offered food and drink. Staff will monitor q70min for safety.

## 2012-08-30 NOTE — Progress Notes (Signed)
Psychoeducational Group Note  Date:  08/30/2012 Time:  0930  Group Topic/Focus:  Recovery Goals:   The focus of this group is to identify appropriate goals for recovery and establish a plan to achieve them.  Participation Level: Did Not Attend  Participation Quality:  Not Applicable  Affect:  Not Applicable  Cognitive:  Not Applicable  Insight:  Not Applicable  Engagement in Group: Not Applicable  Additional Comments:  Pt refused to come to group this morning.  Dixon Luczak E 08/30/2012, 1:35 PM

## 2012-08-30 NOTE — Progress Notes (Signed)
Patient ID: [name] [name], male   DOB: 09-15-1991, 21 y.o.   MRN: [id number]  D: pt states he continues to have AH without command passively.  Pt denies SI/HI/VH. Pt is pleasant and cooperative. Pt offered minimal interaction at this time.    A: Pt was offered support and encouragement. Pt was given scheduled medications. Pt was encourage to attend groups. Q 15 minute checks were done for safety.   R:Pt attends groups and interacts well with peers and staff. Pt is taking medication. Pt has no complaints.Pt receptive to treatment and safety maintained on unit.

## 2012-08-31 NOTE — Progress Notes (Signed)
Patient ID: [name] [name], male   DOB: 1990/11/17, 21 y.o.   MRN: [id number] D: Patient in room on approach. Pt is appropriate on unit. Pt denies SI/HI/AVH  Pt attended evening wrap up group and Interacted appropriately with peers. Cooperative with assessment. No acute distressed noted at this time. Pt encouraged to come to staff with any question or concerns   A: Met with pt 1:1. Safety has been maintained with Q15 minutes observation. Supported and encouragement provided to attend groups.   R: Patient remains safe. He is complaint with group programming. Safety has been maintained Q15 and continue current POC

## 2012-08-31 NOTE — Progress Notes (Signed)
Psychoeducational Group Note  Date:  08/31/2012 Time:  1100  Group Topic/Focus:  Crisis Planning:   The purpose of this group is to help patients create a crisis plan for use upon discharge or in the future, as needed.  Participation Level:  Active  Participation Quality:  Appropriate and Attentive  Affect:  Appropriate  Cognitive:  Alert and Appropriate  Insight:   Engagement in Group:  Engaged  Additional Comments:  Pt. Participated in creating crisis plan with the group.   Ruta Hinds [facility] 08/31/2012, 11:27 AM

## 2012-08-31 NOTE — Progress Notes (Signed)
[facility] LCSW Aftercare Discharge Planning Group Note  08/31/2012 3:53 PM  Participation Quality:  Appropriate  Affect:  Appropriate  Cognitive:  Appropriate  Insight:  Improving  Engagement in Group:  Improving  Modes of Intervention:  Discussion, Exploration and Socialization  Summary of Progress/Problems:Today [name] [name] states the voices have disappeared, thanks to the medication that he got last night.  He has access to transportation [uses his dad's car] and hopes to be released by Friday so he can get back to work.  [name] [name] 08/31/2012, 3:53 PM

## 2012-08-31 NOTE — Progress Notes (Signed)
The focus of this group is to help patients review their daily goal of treatment and discuss progress on daily workbooks. Pt attended the evening group and participated in the discussion about long term goals. Pt stated that in one year he hoped to be drug-free and that being drug-free would positively impact all other parts of his life. Pt stated he was tired of always relapsing and wanted to break the cycle.

## 2012-08-31 NOTE — Progress Notes (Signed)
D: Pt denies SI/HI/AVH. Pt denies pain and show no s/s of distress. Pt reported that the medicine he is taking has stopped the auditory hallucinations. Pt reported that he talked to his father last night and that his father told him to get the support he needs. Pt has been pleasant, compliant with his meds and engaged on the unit  A: Medications administered as ordered per MD. Verbal support given. Pt encouraged to attend groups. 15 minute checks performed for safety.  R: Cooperative with staff, compliant with treatment and reported feeling better today.

## 2012-08-31 NOTE — Progress Notes (Signed)
[facility] MD Progress Note  08/31/2012 10:39 AM [name] [name]  MRN:  [id number] Subjective: " I doing a lot better today"  Diagnosis:  Substance-induced psychotic disorder with delusions   ADL's:  Intact  Sleep: Good  Appetite:  Good  Suicidal Ideation:  Plan:  denies Intent:  denies Means:  denies  Homicidal Ideation:  Plan:  denies Intent:  denies Means:  denies  AEB (as evidenced by): Patient reports decreasing auditory hallucination, agitation and mood lability. He states that he wants to get better and go back to his job. He hope to quit using drugs so that "I can live a normal life". Patient is compliant with his medications and did not report any adverse reaction.  Psychiatric Specialty Exam: Review of Systems  Constitutional: Negative.   HENT: Negative.   Eyes: Negative.   Respiratory: Negative.   Cardiovascular: Negative.   Gastrointestinal: Negative.   Genitourinary: Negative.   Musculoskeletal: Negative.   Skin: Negative.   Neurological: Negative.   Endo/Heme/Allergies: Negative.   Psychiatric/Behavioral: Positive for hallucinations and substance abuse.    Blood pressure 120/79, pulse 93, temperature 97 F (36.1 C), temperature source Oral, resp. rate 18, height 5\' 3"  (1.6 m), weight 68.947 kg (152 lb).Body mass index is 26.93 kg/(m^2).  General Appearance: Casual and Fairly Groomed  Eye Contact::  Good  Speech:  Normal Rate  Volume:  Normal  Mood:  Dysphoric  Affect:  Restricted  Thought Process:  Linear and Logical  Orientation:  Full (Time, Place, and Person)  Thought Content:  Hallucinations: Auditory  Suicidal Thoughts:  No  Homicidal Thoughts:  No  Memory:  Immediate;   Good Recent;   Good Remote;   Good  Judgement:  Fair  Insight:  Shallow  Psychomotor Activity:  Normal  Concentration:  Good  Recall:  Good  Akathisia:  No  Handed:  Right  AIMS (if indicated):     Assets:  Communication Skills Desire for Improvement Physical  Health Social Support  Sleep:  Number of Hours: 6.75    Current Medications: Current Facility-Administered Medications  Medication Dose Route Frequency Provider Last Rate Last Dose  . acetaminophen (TYLENOL) tablet 650 mg  650 mg Oral Q6H PRN Kerry Hough, PA      . alum & mag hydroxide-simeth (MAALOX/MYLANTA) 200-200-20 MG/5ML suspension 30 mL  30 mL Oral Q4H PRN Kerry Hough, PA      . benztropine (COGENTIN) tablet 0.5 mg  0.5 mg Oral BID Nyomi Howser   0.5 mg at 08/31/12 0758  . chlordiazePOXIDE (LIBRIUM) capsule 25 mg  25 mg Oral QID PRN Kerry Hough, PA      . haloperidol (HALDOL) tablet 2 mg  2 mg Oral BID Tysen Roesler   2 mg at 08/31/12 0758  . magnesium hydroxide (MILK OF MAGNESIA) suspension 30 mL  30 mL Oral Daily PRN Kerry Hough, PA      . nicotine (NICODERM CQ - dosed in mg/24 hours) patch 21 mg  21 mg Transdermal Q0600 Kerry Hough, PA      . traZODone (DESYREL) tablet 50 mg  50 mg Oral QHS PRN Catarina Huntley      . [DISCONTINUED] risperiDONE (RISPERDAL) tablet 1 mg  1 mg Oral QHS Kerry Hough, PA   1 mg at 08/30/12 4098    Lab Results:  Results for orders placed during the hospital encounter of 08/29/12 (from the past 48 hour(s))  TSH     Status: Normal   Collection  Time   08/30/12  6:15 AM      Component Value Range Comment   TSH 2.905  0.350 - 4.500 uIU/mL     Physical Findings: AIMS: Facial and Oral Movements Muscles of Facial Expression: None, normal Lips and Perioral Area: None, normal Jaw: None, normal Tongue: None, normal,Extremity Movements Upper (arms, wrists, hands, fingers): None, normal Lower (legs, knees, ankles, toes): None, normal, Trunk Movements Neck, shoulders, hips: None, normal, Overall Severity Severity of abnormal movements (highest score from questions above): None, normal Incapacitation due to abnormal movements: None, normal Patient's awareness of abnormal movements (rate only patient's report): No Awareness,  Dental Status Current problems with teeth and/or dentures?: No Does patient usually wear dentures?: No  CIWA:  CIWA-Ar Total: 1  COWS:  COWS Total Score: 2   Treatment Plan Summary: Daily contact with patient to assess and evaluate symptoms and progress in treatment Medication management  Plan: 1. I will continue patient on current medication regimen. 2. Patient encouraged to attend group and other millieu.  Medical Decision Making Problem Points:  Established problem, stable/improving (1), Review of last therapy session (1) and Self-limited or minor (1) Data Points:  Review or order clinical lab tests (1) Review of medication regiment & side effects (2) Review of new medications or change in dosage (2)  I certify that inpatient services furnished can reasonably be expected to improve the patient's condition.   Thedore Mins, MD 08/31/2012, 10:39 AM

## 2012-08-31 NOTE — Progress Notes (Signed)
[facility] LCSW Group Therapy  08/31/2012 3:55 PM  Type of Therapy:  Mental Health Assoc.  Participation Level:  Minimal  Participation Quality:  Attentive  Affect:  Appropriate  Cognitive:  Appropriate  Insight:  Engaged  Engagement in Therapy:  Engaged  Modes of Intervention:  Education and Socialization  Summary of Progress/Problems: [name] [name] WAS ATTENTIVE THROUGHOUT THE PRESENTATION AND THE GUITAR PERFORMANCE.  HE ASKED NO QUESTIONS.  [name] 08/31/2012, 3:55 PM

## 2012-08-31 NOTE — Treatment Plan (Signed)
Interdisciplinary Treatment Plan Update (Adult)  Date: 08/31/2012  Time Reviewed: 3:57 PM   Progress in Treatment: Attending groups: Yes Participating in groups: Yes Taking medication as prescribed: Yes Tolerating medication: Yes   Family/Significant other contact made:  Not yet Patient understands diagnosis:  Yes As evidenced by asking for help with voices Discussing patient identified problems/goals with staff:  Yes  See below Medical problems stabilized or resolved:  Yes Denies suicidal/homicidal ideation: Yes  In tx team Issues/concerns per patient self-inventory:  None noted Other:  New problem(s) identified: N/A  Reason for Continuation of Hospitalization: Hallucinations Medication stabilization  Interventions implemented related to continuation of hospitalization: Haldol trial,  Encourage group attendance and participation, CM to make collateral family contact  Additional comments:  Estimated length of stay: 2-3 days  Discharge Plan: return home, follow up outpt  New goal(s): N/A  Review of initial/current patient goals per problem list:   1.  Goal(s): Eliminate psychosis with the help fo medication, therapeutic milieu  Met:  No  Target date:12/13  As evidenced by:M will report the voices are gone  2.  Goal (s):Identify comprehensive sobriety plan  Met:  No  Target date:12/13  As evidenced [id number] report  3.  Goal(s):Set up for outpt follow up  Met:  No  Target date:12/13  As evidenced [id number] of appointment  4.  Goal(s):  Met:  No  Target date:  As evidenced by:  Attendees: Patient:     Family:     Physician:  Thedore Mins 08/31/2012 3:57 PM   Nursing:  Liborio Nixon  08/31/2012 3:57 PM   Clinical Social Worker:  Richelle Ito 08/31/2012 3:57 PM   Extender:  Verne Spurr PA 08/31/2012 3:57 PM   Other:     Other:     Other:     Other:      Scribe for Treatment Team:   Ida Rogue, 08/31/2012 3:57 PM

## 2012-09-01 MED ORDER — TRAZODONE HCL 50 MG PO TABS: 50.0000 mg | ORAL_TABLET | Freq: Every evening | ORAL | Status: DC | PRN

## 2012-09-01 MED ORDER — BENZTROPINE MESYLATE 0.5 MG PO TABS: 0.5000 mg | ORAL_TABLET | Freq: Two times a day (BID) | ORAL | Status: DC

## 2012-09-01 MED ORDER — HALOPERIDOL 2 MG PO TABS
2.0000 mg | ORAL_TABLET | Freq: Two times a day (BID) | ORAL | Status: DC
Start: 2012-09-01 — End: 2015-01-14

## 2012-09-01 NOTE — Progress Notes (Signed)
[facility] Group Notes:  (Counselor/Nursing/MHT/Case Management/Adjunct)  09/01/2012 4:41 PM  Type of Therapy:  Psychoeducational Skills  Participation Level:  Minimal  Participation Quality:  Appropriate and Attentive  Affect:  Appropriate  Cognitive:  Alert, Appropriate and Oriented  Insight:  Good  Engagement in Group:  Engaged  Engagement in Therapy:  n/a  Modes of Intervention:  Activity, Discussion, Education, Rapport Building, Socialization and Support  Summary of Progress/Problems: [name] [name] attended psychoeducational group on labels. [name] [name] viewed a video depicting different ways labels are used to change perceptions. [name] [name] was quiet but attentive and spoke with insight when called on while group discussed what labels are, how we use them, how they effect they way we think about and perceive the world, and listed positive and negative labels they have used or been called. [name] [name] participated in Investment banker, operational listing a goal for the day, something unique about himself, and a favorite holiday song. [name] [name] participated in an art activity writing something unique on a snowflake and hanging it on the hall "tree".   Wandra Scot 09/01/2012, 4:41 PM

## 2012-09-01 NOTE — Progress Notes (Signed)
[facility] Adult Case Management Discharge Plan :  Will you be returning to the same living situation after discharge: Yes,  home At discharge, do you have transportation home?:Yes,  sister Do you have the ability to pay for your medications:Yes,  mental health  Release of information consent forms completed and in the chart;  Patient's signature needed at discharge.  Patient to Follow up at: Follow-up Information    Follow up with [facility]. On 09/06/2012. (Your appointment is at 8:30 AM on tues at the walk in clinic.  this is where you will see the psychiatrist for your medication.)    Contact information:   [address]  [place]  [phone]         Patient denies SI/HI:   Yes,  yes    Safety Planning and Suicide Prevention discussed:  Yes,  yes  [name] 09/01/2012, 10:10 AM

## 2012-09-01 NOTE — Discharge Summary (Signed)
Physician Discharge Summary Note  Patient:  [name] [name] is an 21 y.o., male MRN:  [id number] DOB:  [birth year] Patient phone:  [phone] (home)  Patient address:   [address] [place] [address],   Date of Admission:  08/29/2012 Date of Discharge: 09/01/2012  Reason for Admission:  Auditory and visual hallucinations with paranoia, polysubstance dependency  Discharge Diagnoses: Principal Problem:  *Substance-induced psychotic disorder with delusions  Review of Systems  Constitutional: Negative.   HENT: Negative.   Eyes: Negative.   Respiratory: Negative.   Cardiovascular: Negative.   Gastrointestinal: Negative.   Genitourinary: Negative.   Musculoskeletal: Negative.   Skin: Negative.   Neurological: Negative.   Endo/Heme/Allergies: Negative.   Psychiatric/Behavioral: Positive for substance abuse.   Axis Diagnosis:   AXIS I:  Substance Abuse and Substance Induced Mood Disorder AXIS II:  Deferred AXIS III:   Past Medical History  Diagnosis Date  . Mental disorder    AXIS IV:  economic problems, occupational problems, other psychosocial or environmental problems, problems related to social environment and problems with primary support group AXIS V:  61-70 mild symptoms  Level of Care:  OP  Hospital Course:  Individual and group therapy, haldol implemented for hallucinations and cogentin to prevent EPS, trazodone added for sleep.  Patient denies suicidal/homicidal ideations and hallucinations.  He is committed to maintain sobriety and encouraged to go to support groups.  His family is supportive and he will return to live with them.  Consults:  None  Significant Diagnostic Studies:  labs: Completed and reviewed, stable  Discharge Vitals:   Blood pressure 125/83, pulse 101, temperature 97.3 F (36.3 C), temperature source Oral, resp. rate 16, height 5\' 3"  (1.6 m), weight 68.947 kg (152 lb). Body mass index is 26.93 kg/(m^2). Lab Results:   Results for  orders placed during the hospital encounter of 08/29/12 (from the past 72 hour(s))  TSH     Status: Normal   Collection Time   08/30/12  6:15 AM      Component Value Range Comment   TSH 2.905  0.350 - 4.500 uIU/mL     Physical Findings: AIMS: Facial and Oral Movements Muscles of Facial Expression: None, normal Lips and Perioral Area: None, normal Jaw: None, normal Tongue: None, normal,Extremity Movements Upper (arms, wrists, hands, fingers): None, normal Lower (legs, knees, ankles, toes): None, normal, Trunk Movements Neck, shoulders, hips: None, normal, Overall Severity Severity of abnormal movements (highest score from questions above): None, normal Incapacitation due to abnormal movements: None, normal Patient's awareness of abnormal movements (rate only patient's report): No Awareness, Dental Status Current problems with teeth and/or dentures?: No Does patient usually wear dentures?: No  CIWA:  CIWA-Ar Total: 1  COWS:  COWS Total Score: 2   Psychiatric Specialty Exam: See Psychiatric Specialty Exam and Suicide Risk Assessment completed by Attending Physician prior to discharge.  Discharge destination:  Home  Is patient on multiple antipsychotic therapies at discharge:  No   Has Patient had three or more failed trials of antipsychotic monotherapy by history:  No Recommended Plan for Multiple Antipsychotic Therapies:  N/A  Discharge Orders    Future Orders Please Complete By Expires   Diet - low sodium heart healthy      Activity as tolerated - No restrictions          Medication List     As of 09/01/2012 10:49 AM    TAKE these medications      Indication    benztropine 0.5 MG tablet  Commonly known as: COGENTIN   Take 1 tablet (0.5 mg total) by mouth 2 (two) times daily.    Indication: Extrapyramidal Reaction caused by Medications      haloperidol 2 MG tablet   Commonly known as: HALDOL   Take 1 tablet (2 mg total) by mouth 2 (two) times daily.     Indication: Psychosis      traZODone 50 MG tablet   Commonly known as: DESYREL   Take 1 tablet (50 mg total) by mouth at bedtime as needed for sleep.    Indication: Trouble Sleeping           Follow-up Information    Follow up with [facility]. On 09/06/2012. (Your appointment is at 8:30 AM on tues at the walk in clinic.  this is where you will see the psychiatrist for your medication.)    Contact information:   [address]  [place]  [phone]] [phone]         Follow-up recommendations:  Activity as tolerated, low-sodium heart healthy diet  Comments:  Patient will discharge to his sister and go back home to his supportive family.  He will follow-up in outpatient and encouraged to attend support groups to maintain sobriety.    Total Discharge Time:  Greater than 30 minutes  Signed: Nanine Means, PMH-NP 09/01/2012, 10:49 AM

## 2012-09-01 NOTE — Progress Notes (Signed)
Psychoeducational Group Note  Date:  09/01/2012 Time:  0930  Group Topic/Focus:  Self Esteem Action Plan:   The focus of this group is to help patients create a plan to continue to build self-esteem after discharge.  Participation Level:  Active  Participation Quality:  Attentive  Affect:  Appropriate  Cognitive:  Appropriate  Insight:  Engaged  Engagement in Group:  Engaged  Additional Comments:  Pt attended group and was excited about going home today.  Taahir Grisby E 09/01/2012, 2:04 PM

## 2012-09-01 NOTE — Progress Notes (Signed)
Patient ID: [name] [name], male   DOB: November 14, 1990, 21 y.o.   MRN: [id number]  Patient discharged home per MD order.  Reviewed discharge instructions with patient; medication review.  Patient denies any SI/HI/AVH.  Patient left ambulatory with his sister; his behavior was appropriate.  He received all belongings out of his locker.

## 2012-09-01 NOTE — Progress Notes (Signed)
Patient ID: [name] [name], male   DOB: August 19, 1991, 21 y.o.   MRN: [id number] Patient to be discharged home today.  Patient has been an active participant in his treatment here; he has been attending groups.  He denies SI/HI/AVH.  He denies any depression.  Patient will be discharged to the care of his sister around 18.  Continue safety checks every 15 minutes per protocol.  Initiate discharge instructions and return belongings to patient from search room on the way out upon discharge.  Patient's behavior has been appropriate.

## 2012-09-01 NOTE — [facility] Suicide Risk Assessment (Signed)
Suicide Risk Assessment  Discharge Assessment     Demographic Factors:  Male  Mental Status Per Nursing Assessment::   On Admission:  NA  Current Mental Status by Physician: patient denies suicidal ideations, intent or plan  Loss Factors: NA  Historical Factors: NA  Risk Reduction Factors:   Employed and Positive therapeutic relationship  Continued Clinical Symptoms:  Resolving psychosis  Cognitive Features That Contribute To Risk:  Polarized thinking    Suicide Risk:  Minimal: No identifiable suicidal ideation.  Patients presenting with no risk factors but with morbid ruminations; may be classified as minimal risk based on the severity of the depressive symptoms  Discharge Diagnoses:   AXIS I:  Substance-induced psychotic disorder with delusions  AXIS II:  Deferred AXIS III:   Past Medical History  Diagnosis Date   AXIS IV:  other psychosocial or environmental problems AXIS V:  61-70 mild symptoms  Plan Of Care/Follow-up recommendations:  Activity:  as tolerated Diet:  healthy Tests:  routine Other:  routine  Is patient on multiple antipsychotic therapies at discharge:  No   Has Patient had three or more failed trials of antipsychotic monotherapy by history:  No  Recommended Plan for Multiple Antipsychotic Therapies:   Thedore Mins, MD 09/01/2012, 9:30 AM

## 2012-09-02 NOTE — H&P (Signed)
Seen and agreed. Reann Dobias, MD 

## 2012-09-05 NOTE — Progress Notes (Signed)
Patient Discharge Instructions:  After Visit Summary (AVS):   Faxed to:  09/05/12 Psychiatric Admission Assessment Note:   Faxed to:  09/05/12 Suicide Risk Assessment - Discharge Assessment:   Faxed to:  09/05/12 Faxed/Sent to the Next Level Care provider:  09/05/12 Faxed to [facility] @ [phone]  Jerelene Redden, 09/05/2012, 4:14 PM

## 2015-01-14 ENCOUNTER — Encounter: Payer: Self-pay | Admitting: Endocrinology

## 2015-01-14 ENCOUNTER — Ambulatory Visit (INDEPENDENT_AMBULATORY_CARE_PROVIDER_SITE_OTHER): Payer: 59 | Admitting: Endocrinology

## 2015-01-14 VITALS — BP 134/90 | HR 81 | Temp 98.5°F | Ht 65.5 in | Wt 163.0 lb

## 2015-01-14 DIAGNOSIS — E291 Testicular hypofunction: Secondary | ICD-10-CM | POA: Diagnosis not present

## 2015-01-14 LAB — TSH: TSH: 1.16 u[IU]/mL (ref 0.35–4.50)

## 2015-01-14 LAB — IBC PANEL
Iron: 59 ug/dL (ref 42–165)
Saturation Ratios: 16.2 % — ABNORMAL LOW (ref 20.0–50.0)
Transferrin: 260 mg/dL (ref 212.0–360.0)

## 2015-01-14 LAB — HCG, QUANTITATIVE, PREGNANCY: Quantitative HCG: 0.07 m[IU]/mL

## 2015-01-14 NOTE — Progress Notes (Signed)
Subjective:    Patient ID: [name] [name], male    DOB: [birth year], 24 y.o.   MRN: [id number]  HPI Hx is from pt and mother.  pt reports he started puberty at the normal age, but dis not complete it.  He has no biological children.  He took androgel x 1-2 years, but was then changed to clomid in early 2016.  He stopped it 2 weeks ago.  says he has never taken illicit androgens.  He does not take antiandrogens or opioids.  He denies any h/o infertility, XRT, or genital infection.  He has never had surgery, or a serious injury to the head or genital area.  He does not consume alcohol excessively.  He has slightly decreased strength throughout the body, and assoc ED sxs.  He had w/u of this in [place], [place] a few years ago.   Past Medical History  Diagnosis Date  . Mental disorder     Past Surgical History  Procedure Laterality Date  . No past surgeries      History   Social History  . Marital Status: Single    Spouse Name: N/A  . Number of Children: N/A  . Years of Education: N/A   Occupational History  . Not on file.   Social History Main Topics  . Smoking status: Current Every Day Smoker    Types: Cigarettes  . Smokeless tobacco: Not on file  . Alcohol Use: Yes  . Drug Use: Yes    Special: Marijuana  . Sexual Activity: Not on file   Other Topics Concern  . Not on file   Social History Narrative    No current outpatient prescriptions on file prior to visit.   No current facility-administered medications on file prior to visit.    No Known Allergies  Family History  Problem Relation Age of Onset  . Other Mother   . Other Brother     BP 134/90 mmHg  Pulse 81  Temp(Src) 98.5 F (36.9 C) (Oral)  Ht 5' 5.5" (1.664 m)  Wt 163 lb (73.936 kg)  BMI 26.70 kg/m2  SpO2 99%  Review of Systems denies depression, numbness, weight change, decreased urinary stream, gynecomastia, muscle weakness, fever, headache, easy bruising, sob, rash, blurry vision, chest pain.   He has depression and slight rhinorrhea.      Objective:   Physical Exam VS: see vs page GEN: no distress HEAD: head: no deformity eyes: no periorbital swelling, no proptosis external nose and ears are normal mouth: no lesion seen NECK: supple, thyroid is not enlarged CHEST WALL: no deformity LUNGS: clear to auscultation BREASTS:  bilat pseudogynecomastia CV: reg rate and rhythm, no murmur ABD: abdomen is soft, nontender.  no hepatosplenomegaly.  not distended.  no hernia GENITALIA:  Tanner 3.   MUSCULOSKELETAL: muscle bulk and strength are grossly normal.  no obvious joint swelling.  gait is normal and steady EXTEMITIES: no deformity.  no edema. NEURO:  cn 2-12 grossly intact.   readily moves all 4's.  sensation is intact to touch on al 4's SKIN:  Normal texture and temperature.  No rash or suspicious lesion is visible.  Minimal body hair.  NODES:  None palpable at the neck.  PSYCH: alert, well-oriented.  Does not appear anxious nor depressed.    Lab Results  Component Value Date   TSH 2.905 08/30/2012       Assessment & Plan:  Hypogonadism, new to me, uncertain prognosis.   Patient is advised the following: Patient  Instructions  Please sign a release of information for the testing you had in [place].  blood tests are requested for you today.  We'll let you know about the results.  normalization of testosterone is not known to harm you.  however, there are "theoretical" risks, including increased fertility, hair loss, prostate cancer, benign prostate enlargement, blood clots, liver problems, lower hdl ("good cholesterol"), polycythemia (opposite of anemia), sleep apnea, and behavior changes.

## 2015-01-14 NOTE — Patient Instructions (Addendum)
Please sign a release of information for the testing you had in [place].  blood tests are requested for you today.  We'll let you know about the results.  normalization of testosterone is not known to harm you.  however, there are "theoretical" risks, including increased fertility, hair loss, prostate cancer, benign prostate enlargement, blood clots, liver problems, lower hdl ("good cholesterol"), polycythemia (opposite of anemia), sleep apnea, and behavior changes.

## 2015-01-15 LAB — TESTOSTERONE,FREE AND TOTAL
Testosterone, Free: 1.3 pg/mL — ABNORMAL LOW (ref 9.3–26.5)
Testosterone: 21 ng/dL — ABNORMAL LOW (ref 348–1197)

## 2015-01-15 LAB — T4, FREE: Free T4: 0.96 ng/dL (ref 0.60–1.60)

## 2015-01-15 LAB — PROLACTIN: Prolactin: 5.6 ng/mL (ref 2.1–17.1)

## 2015-01-15 LAB — FOLLICLE STIMULATING HORMONE: FSH: 1.6 m[IU]/mL (ref 1.4–18.1)

## 2015-01-15 LAB — LUTEINIZING HORMONE: LH: 0.27 m[IU]/mL — ABNORMAL LOW (ref 1.50–9.30)

## 2015-01-17 ENCOUNTER — Telehealth: Payer: Self-pay | Admitting: Endocrinology

## 2015-01-17 NOTE — Telephone Encounter (Signed)
Pt has number for med rec release to go to his old md office F# [phone]

## 2015-01-18 LAB — ESTRADIOL, FREE
Estradiol, Free: <0.02 pg/mL (ref <=0.45)
Estradiol: <2 pg/mL (ref <=29)

## 2015-01-18 NOTE — Telephone Encounter (Signed)
Medical release re-faxed.

## 2015-01-24 ENCOUNTER — Telehealth: Payer: Self-pay | Admitting: Endocrinology

## 2015-01-24 NOTE — Telephone Encounter (Signed)
please call patient's mother If we can't get the MRI result from [place], we'll need to order here. Please let me know

## 2015-01-25 NOTE — Telephone Encounter (Signed)
Left voicemail requesting pt's mother to call back and let us know how she would like to proceed.

## 2015-01-29 ENCOUNTER — Other Ambulatory Visit: Payer: Self-pay | Admitting: Endocrinology

## 2015-01-29 DIAGNOSIS — E291 Testicular hypofunction: Secondary | ICD-10-CM

## 2015-02-19 ENCOUNTER — Other Ambulatory Visit: Payer: Self-pay | Admitting: Endocrinology

## 2015-02-19 DIAGNOSIS — Z139 Encounter for screening, unspecified: Secondary | ICD-10-CM

## 2015-03-01 ENCOUNTER — Ambulatory Visit
Admission: RE | Admit: 2015-03-01 | Discharge: 2015-03-01 | Disposition: A | Discharge status: Home / Self Care | Payer: 59 | Source: Ambulatory Visit | Attending: Endocrinology | Admitting: Endocrinology

## 2015-03-01 DIAGNOSIS — E291 Testicular hypofunction: Secondary | ICD-10-CM

## 2015-03-01 DIAGNOSIS — Z139 Encounter for screening, unspecified: Secondary | ICD-10-CM

## 2015-03-01 MED ORDER — GADOBENATE DIMEGLUMINE 529 MG/ML IV SOLN
14.0000 mL | Freq: Once | INTRAVENOUS | Status: AC | PRN
  Administered 2015-03-01: 14 mL via INTRAVENOUS

## 2015-03-05 ENCOUNTER — Other Ambulatory Visit: Payer: Self-pay

## 2015-03-05 MED ORDER — CLOMIPHENE CITRATE 50 MG PO TABS: 50.0000 mg | ORAL_TABLET | Freq: Every day | ORAL | Status: DC

## 2015-04-05 ENCOUNTER — Other Ambulatory Visit: Payer: Self-pay

## 2015-04-05 ENCOUNTER — Telehealth: Payer: Self-pay | Admitting: Internal Medicine

## 2015-04-05 MED ORDER — CLOMIPHENE CITRATE 50 MG PO TABS: 50.0000 mg | ORAL_TABLET | Freq: Every day | ORAL | Status: DC

## 2015-04-05 NOTE — Telephone Encounter (Signed)
Patient called and would like a refill on his medication  Also, he is currently in [place] and will remain there for the next few months  Rx: Colmid 50 mg   [facility]: [facility] [address]  [phone]  Thank you

## 2015-04-05 NOTE — Telephone Encounter (Signed)
Will forward to Dr Ellison 

## 2015-04-06 NOTE — Telephone Encounter (Signed)
Please refill x 1  

## 2015-04-08 MED ORDER — CLOMIPHENE CITRATE 50 MG PO TABS: 50.0000 mg | ORAL_TABLET | Freq: Every day | ORAL | Status: DC

## 2015-04-08 NOTE — Telephone Encounter (Signed)
Rx sent per pt's request to [facility] in [place].

## 2015-05-06 ENCOUNTER — Telehealth: Payer: Self-pay | Admitting: Endocrinology

## 2015-05-06 NOTE — Telephone Encounter (Signed)
Patient is having a reaction to medication Clomid he has a lump under nipple on the left side, please advise

## 2015-05-06 NOTE — Telephone Encounter (Signed)
See note below and please advise, Thanks! 

## 2015-05-06 NOTE — Telephone Encounter (Signed)
Ov this week.  i need to check this, but it is probably just harmless breast tissue growth due to the increasing testosterone.

## 2015-05-07 NOTE — Telephone Encounter (Signed)
Pt scheduled for 05/10/2015 at 8:30.

## 2015-05-09 ENCOUNTER — Ambulatory Visit: Payer: Self-pay | Admitting: Endocrinology

## 2015-05-09 DIAGNOSIS — Z0289 Encounter for other administrative examinations: Secondary | ICD-10-CM

## 2016-03-28 ENCOUNTER — Encounter (HOSPITAL_COMMUNITY): Payer: Self-pay | Admitting: Emergency Medicine

## 2016-03-28 ENCOUNTER — Emergency Department (HOSPITAL_COMMUNITY)
Admission: EM | Admit: 2016-03-28 | Discharge: 2016-03-28 | Disposition: A | Discharge status: Home / Self Care | Payer: PRIVATE HEALTH INSURANCE | Attending: Emergency Medicine | Admitting: Emergency Medicine

## 2016-03-28 DIAGNOSIS — W268XXA Contact with other sharp object(s), not elsewhere classified, initial encounter: Secondary | ICD-10-CM | POA: Diagnosis not present

## 2016-03-28 DIAGNOSIS — Y93F2 Activity, caregiving, lifting: Secondary | ICD-10-CM | POA: Diagnosis not present

## 2016-03-28 DIAGNOSIS — S0501XA Injury of conjunctiva and corneal abrasion without foreign body, right eye, initial encounter: Secondary | ICD-10-CM

## 2016-03-28 DIAGNOSIS — F17210 Nicotine dependence, cigarettes, uncomplicated: Secondary | ICD-10-CM | POA: Diagnosis not present

## 2016-03-28 DIAGNOSIS — Y990 Civilian activity done for income or pay: Secondary | ICD-10-CM | POA: Insufficient documentation

## 2016-03-28 DIAGNOSIS — Y929 Unspecified place or not applicable: Secondary | ICD-10-CM | POA: Insufficient documentation

## 2016-03-28 DIAGNOSIS — S0591XA Unspecified injury of right eye and orbit, initial encounter: Secondary | ICD-10-CM | POA: Diagnosis present

## 2016-03-28 MED ORDER — FLUORESCEIN SODIUM 1 MG OP STRP
1.0000 | ORAL_STRIP | Freq: Once | OPHTHALMIC | Status: AC
  Administered 2016-03-28: 1 via OPHTHALMIC
  Filled 2016-03-28: qty 1

## 2016-03-28 MED ORDER — TETRACAINE HCL 0.5 % OP SOLN
2.0000 [drp] | Freq: Once | OPHTHALMIC | Status: AC
  Administered 2016-03-28: 2 [drp] via OPHTHALMIC
  Filled 2016-03-28: qty 4

## 2016-03-28 MED ORDER — POLYMYXIN B-TRIMETHOPRIM 10000-0.1 UNIT/ML-% OP SOLN
1.0000 [drp] | OPHTHALMIC | Status: DC
  Administered 2016-03-28: 1 [drp] via OPHTHALMIC
  Filled 2016-03-28: qty 10

## 2016-03-28 MED ORDER — TETANUS-DIPHTH-ACELL PERTUSSIS 5-2.5-18.5 LF-MCG/0.5 IM SUSP
0.5000 mL | Freq: Once | INTRAMUSCULAR | Status: AC
  Administered 2016-03-28: 0.5 mL via INTRAMUSCULAR
  Filled 2016-03-28: qty 0.5

## 2016-03-28 NOTE — ED Notes (Signed)
EDP at bedside  

## 2016-03-28 NOTE — Discharge Instructions (Signed)
Please apply polytrim eye drop every 4 hrs while awake for the next 5 days to prevent eye infection.  Take tylenol or ibuprofen as needed for pain. Follow up with eye specialist if your symptoms worsen or return to the ER for further care.   Corneal Abrasion The cornea is the clear covering at the front and center of the eye. When you look at the colored portion of the eye, you are looking through the cornea. It is a thin tissue made up of layers. The top layer is the most sensitive layer. A corneal abrasion happens if this layer is scratched or an injury causes it to come off.  HOME CARE  You may be given drops or a medicated cream. Use the medicine as told by your doctor.  A pressure patch may be put over the eye. If this is done, follow your doctor's instructions for when to remove the patch. Do not drive or use machines while the eye patch is on. Judging distances is hard to do with a patch on.  See your doctor for a follow-up exam if you are told to do so. It is very important that you keep this appointment. GET HELP IF:   You have pain, are sensitive to light, and have a scratchy feeling in one eye or both eyes.  Your pressure patch keeps getting loose. You can blink your eye under the patch.  You have fluid coming from your eye or the lids stick together in the morning.  You have the same symptoms in the morning that you did with the first abrasion. This could be days, weeks, or months after the first abrasion healed.   This information is not intended to replace advice given to you by your health care provider. Make sure you discuss any questions you have with your health care provider.   Document Released: 02/24/2008 Document Revised: 05/29/2015 Document Reviewed: 05/15/2013 Elsevier Interactive Patient Education [employer]2016 [employer]

## 2016-03-28 NOTE — ED Notes (Signed)
Pt reports right eye injury related to contact with wooden pallet. With triage pupil equal and reactive to light; conjunctiva noted to be red.

## 2016-03-28 NOTE — ED Provider Notes (Signed)
CSN: [id number]     Arrival date & time 03/28/16  0855 History   First MD Initiated Contact with Patient 03/28/16 0913     Chief Complaint  Patient presents with  . Eye Injury     (Consider location/radiation/quality/duration/timing/severity/associated sxs/prior Treatment) HPI   25 year old male with history of mental disorder presenting for evaluation of right eye injury. Patient reports yesterday while at work, he was lifting a wooden pallet when the edge of the pallet make contact with his right eye. He report acute onset of pain to his right eye and has had pain since. Describe pain as a mild burning sensation with a sensation of "something is in my eye". He denies any pain with eye movement, light sensitivity, changes in vision, diplopia, or headache. He did try rinsing his eye with an over-the-counter eye rinse. He does not wear any contact lens and then able to recall last tetanus status. No other complaint.  Past Medical History  Diagnosis Date  . Mental disorder    Past Surgical History  Procedure Laterality Date  . No past surgeries     Family History  Problem Relation Age of Onset  . Other Mother   . Other Brother    Social History  Substance Use Topics  . Smoking status: Current Every Day Smoker    Types: Cigarettes  . Smokeless tobacco: None  . Alcohol Use: Yes    Review of Systems  Constitutional: Negative for fever.  Eyes: Positive for pain and redness. Negative for photophobia, discharge, itching and visual disturbance.  Skin: Negative for rash and wound.      Allergies  Review of patient's allergies indicates no known allergies.  Home Medications   Prior to Admission medications   Medication Sig Start Date End Date Taking? Authorizing Provider  clomiPHENE (CLOMID) 50 MG tablet Take 1 tablet (50 mg total) by mouth daily. 04/08/15   Romero BellingSean Ellison, MD   BP 134/87 mmHg  Pulse 70  Temp(Src) 98.1 F (36.7 C) (Oral)  Resp 16  SpO2 99% Physical Exam    Constitutional: He appears well-developed and well-nourished. No distress.  HENT:  Head: Atraumatic.  Eyes: EOM and lids are normal. Pupils are equal, round, and reactive to light. Lids are everted and swept, no foreign bodies found. Right eye exhibits no chemosis, no discharge, no exudate and no hordeolum. No foreign body present in the right eye. Left eye exhibits no discharge. Right conjunctiva is injected. Right conjunctiva has no hemorrhage. Left conjunctiva is not injected. Left conjunctiva has no hemorrhage. No scleral icterus.  Fundoscopic exam:      The right eye shows no hemorrhage and no papilledema. The right eye shows no red reflex.  Slit lamp exam:      The right eye shows corneal abrasion and fluorescein uptake. The right eye shows no corneal flare, no corneal ulcer, no foreign body, no hyphema and no hypopyon.       The left eye shows no fluorescein uptake.    10:07:06 Visual Acuity DK Visual Acuity - Bilateral Near: 20/20 ; R Near: 20/20 ; L Near: 20/20     Neck: Neck supple.  Neurological: He is alert.  Skin: No rash noted.  Psychiatric: He has a normal mood and affect.  Nursing note and vitals reviewed.   ED Course  Procedures (including critical care time)   MDM   Final diagnoses:  Injury of conjunctiva and corneal abrasion of right eye without foreign body, initial encounter  BP 134/87 mmHg  Pulse 70  Temp(Src) 98.1 F (36.7 C) (Oral)  Resp 16  SpO2 99%   9:55 AM Patient presents with right eye injury when the edge of a wooden pallet hit his eye. On examination patient does have evidence of a small corneal abrasion noted in the right eye. No foreign object identified. I also performed and ophthalmology ultrasound without any evidence of retained foreign object, negative Seidel sign, no signs of retinal detachment. We'll discharge with Polytrim eyedrops and ophthalmology follow-up as needed.  EMERGENCY DEPARTMENT [place] OCULAR EXAM "Study: Limited  Ultrasound of Orbit "  INDICATIONS: Traumatic  Linear probe utilized to obtain images in both long and short axis of the orbit having the patient look left and right if possible.  PERFORMED BY: Myself  IMAGES ARCHIVED?: Yes  LIMITATIONS: none  VIEWS USED: Right orbit  INTERPRETATION: No retinal detachment, Lens in proper position, no evidence of foreign body     Fayrene Helper, PA-C 03/28/16 1009  Gerhard Munch, MD 03/28/16 [phone]

## 2016-12-19 ENCOUNTER — Emergency Department (HOSPITAL_COMMUNITY): Payer: No Typology Code available for payment source

## 2016-12-19 ENCOUNTER — Encounter (HOSPITAL_COMMUNITY): Payer: Self-pay | Admitting: Emergency Medicine

## 2016-12-19 ENCOUNTER — Emergency Department (HOSPITAL_COMMUNITY)
Admission: EM | Admit: 2016-12-19 | Discharge: 2016-12-20 | Disposition: A | Discharge status: Home / Self Care | Payer: No Typology Code available for payment source | Attending: Emergency Medicine | Admitting: Emergency Medicine

## 2016-12-19 DIAGNOSIS — Y999 Unspecified external cause status: Secondary | ICD-10-CM | POA: Insufficient documentation

## 2016-12-19 DIAGNOSIS — S0101XA Laceration without foreign body of scalp, initial encounter: Secondary | ICD-10-CM | POA: Insufficient documentation

## 2016-12-19 DIAGNOSIS — Z23 Encounter for immunization: Secondary | ICD-10-CM | POA: Insufficient documentation

## 2016-12-19 DIAGNOSIS — S0181XA Laceration without foreign body of other part of head, initial encounter: Secondary | ICD-10-CM | POA: Insufficient documentation

## 2016-12-19 DIAGNOSIS — S60221A Contusion of right hand, initial encounter: Secondary | ICD-10-CM

## 2016-12-19 DIAGNOSIS — F17210 Nicotine dependence, cigarettes, uncomplicated: Secondary | ICD-10-CM | POA: Insufficient documentation

## 2016-12-19 DIAGNOSIS — Y929 Unspecified place or not applicable: Secondary | ICD-10-CM | POA: Insufficient documentation

## 2016-12-19 DIAGNOSIS — S0990XA Unspecified injury of head, initial encounter: Secondary | ICD-10-CM | POA: Insufficient documentation

## 2016-12-19 DIAGNOSIS — S60041A Contusion of right ring finger without damage to nail, initial encounter: Secondary | ICD-10-CM

## 2016-12-19 DIAGNOSIS — Y939 Activity, unspecified: Secondary | ICD-10-CM | POA: Insufficient documentation

## 2016-12-19 LAB — I-STAT CHEM 8, ED
BUN: 6 mg/dL (ref 6–20)
Calcium, Ion: 1.13 mmol/L — ABNORMAL LOW (ref 1.15–1.40)
Chloride: 103 mmol/L (ref 101–111)
Creatinine, Ser: 0.5 mg/dL — ABNORMAL LOW (ref 0.61–1.24)
Glucose, Bld: 118 mg/dL — ABNORMAL HIGH (ref 65–99)
HCT: 34 % — ABNORMAL LOW (ref 39.0–52.0)
Hemoglobin: 11.6 g/dL — ABNORMAL LOW (ref 13.0–17.0)
Potassium: 3.1 mmol/L — ABNORMAL LOW (ref 3.5–5.1)
Sodium: 139 mmol/L (ref 135–145)
TCO2: 24 mmol/L (ref 0–100)

## 2016-12-19 LAB — CBC
HCT: 35.7 % — ABNORMAL LOW (ref 39.0–52.0)
Hemoglobin: 12.5 g/dL — ABNORMAL LOW (ref 13.0–17.0)
MCH: 29.4 pg (ref 26.0–34.0)
MCHC: 35 g/dL (ref 30.0–36.0)
MCV: 84 fL (ref 78.0–100.0)
Platelets: 274 10*3/uL (ref 150–400)
RBC: 4.25 MIL/uL (ref 4.22–5.81)
RDW: 12.5 % (ref 11.5–15.5)
WBC: 6.8 10*3/uL (ref 4.0–10.5)

## 2016-12-19 LAB — I-STAT CG4 LACTIC ACID, ED: Lactic Acid, Venous: 1.39 mmol/L (ref 0.5–1.9)

## 2016-12-19 LAB — CDS SEROLOGY

## 2016-12-19 LAB — SAMPLE TO BLOOD BANK
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
## 2016-12-19 NOTE — Progress Notes (Signed)
Chaplain responded to page for patient.  Patient being seen by staff.  Chaplain available should patient or family present needing support.    12/19/16 2344  Clinical Encounter Type  Visited With Health care provider   Beryl Meager

## 2016-12-20 ENCOUNTER — Emergency Department (HOSPITAL_COMMUNITY): Payer: No Typology Code available for payment source

## 2016-12-20 LAB — COMPREHENSIVE METABOLIC PANEL
ALT: 15 U/L — ABNORMAL LOW (ref 17–63)
AST: 22 U/L (ref 15–41)
Albumin: 4.2 g/dL (ref 3.5–5.0)
Alkaline Phosphatase: 99 U/L (ref 38–126)
Anion gap: 9 (ref 5–15)
BUN: 6 mg/dL (ref 6–20)
CO2: 22 mmol/L (ref 22–32)
Calcium: 8.9 mg/dL (ref 8.9–10.3)
Chloride: 104 mmol/L (ref 101–111)
Creatinine, Ser: 0.64 mg/dL (ref 0.61–1.24)
GFR calc Af Amer: >60 mL/min (ref >60)
GFR calc non Af Amer: >60 mL/min (ref >60)
Glucose, Bld: 116 mg/dL — ABNORMAL HIGH (ref 65–99)
Potassium: 3.1 mmol/L — ABNORMAL LOW (ref 3.5–5.1)
Sodium: 135 mmol/L (ref 135–145)
Total Bilirubin: 0.7 mg/dL (ref 0.3–1.2)
Total Protein: 6.8 g/dL (ref 6.5–8.1)

## 2016-12-20 LAB — PROTIME-INR
INR: 1.13
Prothrombin Time: 14.5 seconds (ref 11.4–15.2)

## 2016-12-20 LAB — ETHANOL: Alcohol, Ethyl (B): <5 mg/dL (ref <5)

## 2016-12-20 MED ORDER — TETANUS-DIPHTH-ACELL PERTUSSIS 5-2.5-18.5 LF-MCG/0.5 IM SUSP
0.5000 mL | Freq: Once | INTRAMUSCULAR | Status: AC
  Administered 2016-12-20: 0.5 mL via INTRAMUSCULAR
  Filled 2016-12-20: qty 0.5

## 2016-12-20 MED ORDER — ACETAMINOPHEN 325 MG PO TABS
650.0000 mg | ORAL_TABLET | Freq: Once | ORAL | Status: AC
  Administered 2016-12-20: 650 mg via ORAL
  Filled 2016-12-20: qty 2

## 2016-12-20 MED ORDER — IBUPROFEN 400 MG PO TABS
400.0000 mg | ORAL_TABLET | Freq: Once | ORAL | Status: AC
  Administered 2016-12-20: 400 mg via ORAL
  Filled 2016-12-20: qty 1

## 2016-12-20 MED ORDER — LIDOCAINE-EPINEPHRINE (PF) 2 %-1:200000 IJ SOLN
20.0000 mL | Freq: Once | INTRAMUSCULAR | Status: AC
  Administered 2016-12-20: 20 mL
  Filled 2016-12-20: qty 20

## 2016-12-20 NOTE — Discharge Instructions (Signed)
Ice packs to the injured areas the next several days. Keep the wounds clean and dry. Go home and take a shower to get the blood out of your hair. Use triple antibiotic ointment on the wounds. You can take ibuprofen 600 mg + acetaminophen 650 mg every 6 hrs for pain as needed. The staples need to be removed in 7 days. You have 4 lacerations with staples, each laceration has 2 staples. The sutures on your forehead should be removed in about 3-5 days. You have 2 sutures.  Return to the ED for any problems listed on the head injury sheet.

## 2016-12-20 NOTE — ED Notes (Signed)
Patient transported to CT 

## 2016-12-20 NOTE — ED Notes (Signed)
Pt took off c-collar and vital sign monitoring equipment. MD aware

## 2016-12-20 NOTE — ED Notes (Signed)
See physical diagram for wound locations.

## 2016-12-20 NOTE — ED Notes (Signed)
Pt understood dc material. NAD noted. 

## 2016-12-27 ENCOUNTER — Encounter (HOSPITAL_COMMUNITY): Payer: Self-pay

## 2016-12-27 ENCOUNTER — Emergency Department (HOSPITAL_COMMUNITY)
Admission: EM | Admit: 2016-12-27 | Discharge: 2016-12-27 | Disposition: A | Discharge status: Home / Self Care | Payer: No Typology Code available for payment source | Attending: Emergency Medicine | Admitting: Emergency Medicine

## 2016-12-27 DIAGNOSIS — Z4802 Encounter for removal of sutures: Secondary | ICD-10-CM

## 2016-12-27 DIAGNOSIS — F17210 Nicotine dependence, cigarettes, uncomplicated: Secondary | ICD-10-CM | POA: Insufficient documentation

## 2016-12-27 NOTE — Discharge Instructions (Signed)
Please return to the Emergency Department for new or worsening symptoms.  °

## 2016-12-27 NOTE — ED Triage Notes (Signed)
Patient here to have staples removed from scalp and sutures from top of hair line. In place x 8 days
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
## 2016-12-27 NOTE — ED Notes (Signed)
Declined W/C at D/C and was escorted to lobby by RN. 

## 2017-02-26 ENCOUNTER — Encounter (HOSPITAL_COMMUNITY): Payer: Self-pay | Admitting: Emergency Medicine

## 2017-02-26 ENCOUNTER — Emergency Department (HOSPITAL_COMMUNITY)
Admission: EM | Admit: 2017-02-26 | Discharge: 2017-02-28 | Disposition: A | Discharge status: Home / Self Care | Payer: PRIVATE HEALTH INSURANCE | Attending: Emergency Medicine | Admitting: Emergency Medicine

## 2017-02-26 DIAGNOSIS — E291 Testicular hypofunction: Secondary | ICD-10-CM | POA: Insufficient documentation

## 2017-02-26 DIAGNOSIS — F19950 Other psychoactive substance use, unspecified with psychoactive substance-induced psychotic disorder with delusions: Secondary | ICD-10-CM | POA: Insufficient documentation

## 2017-02-26 DIAGNOSIS — R4182 Altered mental status, unspecified: Secondary | ICD-10-CM | POA: Insufficient documentation

## 2017-02-26 DIAGNOSIS — R45851 Suicidal ideations: Secondary | ICD-10-CM

## 2017-02-26 DIAGNOSIS — F209 Schizophrenia, unspecified: Secondary | ICD-10-CM | POA: Insufficient documentation

## 2017-02-26 DIAGNOSIS — F17210 Nicotine dependence, cigarettes, uncomplicated: Secondary | ICD-10-CM | POA: Insufficient documentation

## 2017-02-26 HISTORY — DX: Schizophrenia, unspecified: F20.9

## 2017-02-26 LAB — CBC
HCT: 36.3 % — ABNORMAL LOW (ref 39.0–52.0)
Hemoglobin: 12.4 g/dL — ABNORMAL LOW (ref 13.0–17.0)
MCH: 29.6 pg (ref 26.0–34.0)
MCHC: 34.2 g/dL (ref 30.0–36.0)
MCV: 86.6 fL (ref 78.0–100.0)
Platelets: 266 10*3/uL (ref 150–400)
RBC: 4.19 MIL/uL — ABNORMAL LOW (ref 4.22–5.81)
RDW: 12.7 % (ref 11.5–15.5)
WBC: 6 10*3/uL (ref 4.0–10.5)

## 2017-02-26 LAB — COMPREHENSIVE METABOLIC PANEL
ALT: 14 U/L — ABNORMAL LOW (ref 17–63)
AST: 16 U/L (ref 15–41)
Albumin: 4.5 g/dL (ref 3.5–5.0)
Alkaline Phosphatase: 93 U/L (ref 38–126)
Anion gap: 8 (ref 5–15)
BUN: 8 mg/dL (ref 6–20)
CO2: 24 mmol/L (ref 22–32)
Calcium: 9.1 mg/dL (ref 8.9–10.3)
Chloride: 105 mmol/L (ref 101–111)
Creatinine, Ser: 0.59 mg/dL — ABNORMAL LOW (ref 0.61–1.24)
GFR calc Af Amer: >60 mL/min (ref >60)
GFR calc non Af Amer: >60 mL/min (ref >60)
Glucose, Bld: 79 mg/dL (ref 65–99)
Potassium: 4.1 mmol/L (ref 3.5–5.1)
Sodium: 137 mmol/L (ref 135–145)
Total Bilirubin: 1.1 mg/dL (ref 0.3–1.2)
Total Protein: 7.3 g/dL (ref 6.5–8.1)

## 2017-02-26 LAB — RAPID URINE DRUG SCREEN, HOSP PERFORMED
Amphetamines: NOT DETECTED
Barbiturates: NOT DETECTED
Benzodiazepines: NOT DETECTED
Cocaine: NOT DETECTED
Opiates: NOT DETECTED
Tetrahydrocannabinol: POSITIVE — AB

## 2017-02-26 LAB — ETHANOL: Alcohol, Ethyl (B): <5 mg/dL (ref <5)

## 2017-02-26 MED ORDER — IBUPROFEN 200 MG PO TABS: 600.0000 mg | ORAL_TABLET | Freq: Three times a day (TID) | ORAL | Status: DC | PRN

## 2017-02-26 MED ORDER — ONDANSETRON HCL 4 MG PO TABS: 4.0000 mg | ORAL_TABLET | Freq: Three times a day (TID) | ORAL | Status: DC | PRN

## 2017-02-26 NOTE — ED Notes (Signed)
Pt offered dinner tray and he states that he does not want anything to eat

## 2017-02-26 NOTE — BH Assessment (Addendum)
Tele Assessment Note   [name] SessionsMaurice [name] is an 26 y.o. single male, who voluntarily came into [facility].  Patient reported being concerned about his well-being.  Patient reported being "not to good" and having "trouble getting stuff done."  Patient stated recent experiences with the death of his mother, who he resided with.  Patient reported having to move in the his father.  Patient reported not taking prescribed medication since 2017, due to not being able to afford it.  Patient stated use of Cannabis on 02/25/2017, however reported not using "in a long time."  Per medical records Patient has a history of Schizophrenia.  Patient denies SI/HI/AVH and access to weapons.  Patient denies SIB or aggressive behaviors.    Patient reported living with his father and sister, since the death of his mother.  Patient reports receiving no services from providers, at the present time.  Per medical records, [place]  [facility].  During assessment, patient was cooperative.  Patient was oriented to time, place, and location (x3). Patient reported wanting to receive assistance.  Patient's cognitive functioning appeared to be fair (i.e. Patient reported wanting to receive treatment, however was unsure what he wanted treatment for).     Diagnosis: Per medical records Schizophrenia Cannabis Use Disorder  Past Medical History:  Past Medical History:  Diagnosis Date  . Mental disorder   . Schizophrenia [facility](HCC)     Past Surgical History:  Procedure Laterality Date  . NO PAST SURGERIES      Family History:  Family History  Problem Relation Age of Onset  . Other Mother   . Other Brother     Social History:  reports that he has been smoking Cigarettes.  He does not have any smokeless tobacco history on file. He reports that he drinks alcohol. He reports that he uses drugs, including Marijuana.  Additional Social History:  Alcohol /  Drug Use Pain Medications: Patient denies Prescriptions: Patient denies Over the Counter: Patient denies History of alcohol / drug use?: Yes Longest period of sobriety (when/how long): Unknown Substance #1 Name of Substance 1: Cannabis 1 - Age of First Use: 15 1 - Amount (size/oz): Unknown 1 - Frequency: Unknown 1 - Duration: Ongoing 1 - Last Use / Amount: 02/25/2017  CIWA: CIWA-Ar BP: 119/71 Pulse Rate: 69 COWS:    PATIENT STRENGTHS: (choose at least two) Ability for insight Average or above average intelligence Communication skills General fund of knowledge  Allergies: No Known Allergies  Home Medications:  (Not in a hospital admission)  OB/GYN Status:  No LMP for male patient.  General Assessment Data [facility] Assessment: In system Is this a Tele or Face-to-Face Assessment?: Tele Assessment Is this an Initial Assessment or a Re-assessment for this encounter?: Initial Assessment Marital status: Single Maiden name: N/A Is patient pregnant?: No Pregnancy Status: No Living Arrangements: Parent (With father) Can pt return to current living arrangement?: Yes Admission Status: Voluntary Is patient capable of signing voluntary admission?: Yes Referral Source: Self/Family/Friend Insurance type: [employer]  Medical Screening Exam [facility] Walk-in ONLY) Medical Exam completed: Yes  Crisis Care Plan Living Arrangements: Parent (With father) Name of Psychiatrist: Self Name of Therapist: None  Education Status Is patient currently in school?: No Current Grade: N/A Highest grade of school patient has completed: 12th Name of school: N/A Contact person: N/A  Risk to self with the past 6 months Suicidal Ideation:  No (Pt. denies) Has patient been a risk to self within the past 6 months prior to admission? : No Suicidal Intent: No Has patient had any suicidal intent within the past 6 months prior to admission? : No Is patient at risk for  suicide?: No Suicidal Plan?: No Has patient had any suicidal plan within the past 6 months prior to admission? : No Access to Means: No What has been your use of drugs/alcohol within the last 12 months?: Cannabis Previous Attempts/Gestures: No How many times?: 0 Other Self Harm Risks: None Triggers for Past Attempts: None known Intentional Self Injurious Behavior: None Family Suicide History: No Recent stressful life event(s): Financial Problems, Loss (Comment), Other (Comment), Conflict (Comment) (Death of mother.  Conflict with father.  Unemployed ) Persecutory voices/beliefs?: No Depression: Yes Depression Symptoms: Despondent, Tearfulness, Isolating, Feeling worthless/self pity, Loss of interest in usual pleasures Substance abuse history and/or treatment for substance abuse?: Yes Suicide prevention information given to non-admitted patients: Not applicable  Risk to Others within the past 6 months Homicidal Ideation: No (Pt. denies) Does patient have any lifetime risk of violence toward others beyond the six months prior to admission? : No Thoughts of Harm to Others: No Current Homicidal Intent: No Current Homicidal Plan: No Access to Homicidal Means: No Identified Victim: None History of harm to others?: No Assessment of Violence: On admission Violent Behavior Description: None Does patient have access to weapons?: No Criminal Charges Pending?: No Does patient have a court date: No Is patient on probation?: No  Psychosis Hallucinations: Auditory, Visual Delusions: None noted  Mental Status Report Appearance/Hygiene: In scrubs Eye Contact: Fair Motor Activity: Freedom of movement Speech: Incoherent, Slow Level of Consciousness: Quiet/awake Mood: Depressed, Sad Affect: Appropriate to circumstance, Depressed, Sad Anxiety Level: None Judgement: Unimpaired Orientation: Person, Place, Time, Situation Obsessive Compulsive Thoughts/Behaviors: None  Cognitive  Functioning Concentration: Fair Memory: Recent Intact, Remote Intact IQ: Average Insight: Fair Impulse Control: Good Appetite: Fair Weight Loss: 0 Weight Gain: 0 Sleep: No Change Total Hours of Sleep: 0 Vegetative Symptoms: None  ADLScreening [facility] Assessment Services) Patient's cognitive ability adequate to safely complete daily activities?: Yes Patient able to express need for assistance with ADLs?: Yes Independently performs ADLs?: Yes (appropriate for developmental age)  Prior Inpatient Therapy Prior Inpatient Therapy: Yes Prior Therapy Dates: 2013 Prior Therapy Facilty/Provider(s): [facility] Reason for Treatment: Schizophrenia  Prior Outpatient Therapy Prior Outpatient Therapy: No (Pt. denies) Prior Therapy Dates: N/A Prior Therapy Facilty/Provider(s): N/A Reason for Treatment: N/A Does patient have an ACCT team?: No Does patient have Intensive In-House Services?  : No Does patient have [facility] services? : No Does patient have P4CC services?: No  ADL Screening (condition at time of admission) Patient's cognitive ability adequate to safely complete daily activities?: Yes Is the patient deaf or have difficulty hearing?: No Does the patient have difficulty seeing, even when wearing glasses/contacts?: No Does the patient have difficulty concentrating, remembering, or making decisions?: No Patient able to express need for assistance with ADLs?: Yes Does the patient have difficulty dressing or bathing?: No Independently performs ADLs?: Yes (appropriate for developmental age) Does the patient have difficulty walking or climbing stairs?: No Weakness of Arms/Hands: None  Home Assistive Devices/Equipment Home Assistive Devices/Equipment: None    Abuse/Neglect Assessment (Assessment to be complete while patient is alone) Physical Abuse: Denies Verbal Abuse: Denies Sexual Abuse: Denies Exploitation of patient/patient's resources: Denies Self-Neglect: Denies     Dispensing optician (For Healthcare) Does Patient Have a Medical Advance Directive?:  No Would patient like information on creating a medical advance directive?: No - Patient declined    Additional Information 1:1 In Past 12 Months?: No CIRT Risk: No Elopement Risk: No Does patient have medical clearance?: Yes     Disposition:  Disposition Initial Assessment Completed for this Encounter: Yes Disposition of Patient: Other dispositions (Per Inetta Fermo, NP AM psych eval recommended) Other disposition(s): Other (Comment) Johny Sax, MD notified of disposition [phone]))  Talbert Nan 02/26/2017 5:30 PM

## 2017-02-26 NOTE — ED Notes (Signed)
Pt wanded by security. 

## 2017-02-26 NOTE — ED Notes (Signed)
Pt given urinal and is aware that urine specimen is needed.

## 2017-02-26 NOTE — ED Provider Notes (Addendum)
[facility] Provider Note   CSN: [id number] Arrival date & time: 02/26/17  1431     History   Chief Complaint Chief Complaint  Patient presents with  . Medical Clearance    HPI [name] [name] is a 26 y.o. male.  HPI   Patient is a 26 year old male with history of schizophrenia, no longer on any medications. Patient hasn't taken her spell for last 2 months. Patient's last inpatient stay was in [place]. Patient is now living with his father down here. Patient says he feels unmotivated, trouble performing tasks. He denies HI or SI, however is reluctant to speak.  He also has hypogonadism and reports not being on his testosterone medication.  Past Medical History:  Diagnosis Date  . Mental disorder   . Schizophrenia [facility])     Patient Active Problem List   Diagnosis Date Noted  . 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 NO PAST SURGERIES         Home Medications    Prior to Admission medications   Medication Sig Start Date End Date Taking? Authorizing Provider  clomiPHENE (CLOMID) 50 MG tablet Take 1 tablet (50 mg total) by mouth daily. Patient not taking: Reported on 02/26/2017 04/08/15   Romero Belling, MD    Family History Family History  Problem Relation Age of Onset  . Other Mother   . Other Brother     Social History Social History  Substance Use Topics  . Smoking status: Current Every Day Smoker    Types: Cigarettes  . Smokeless tobacco: Not on file  . Alcohol use Yes     Allergies   Patient has no known allergies.   Review of Systems Review of Systems  Constitutional: Negative for activity change.  Respiratory: Negative for shortness of breath.   Cardiovascular: Negative for chest pain.  Gastrointestinal: Negative for abdominal pain.     Physical Exam Updated Vital Signs BP 119/71 (BP Location: Left Arm)    Pulse 69   Temp 98.4 F (36.9 C) (Oral)   Resp 18   SpO2 98%   Physical Exam  Constitutional: He is oriented to person, place, and time. He appears well-nourished.  Short stature  HENT:  Head: Normocephalic.  Eyes: Conjunctivae are normal.  Cardiovascular: Normal rate.   Pulmonary/Chest: Effort normal and breath sounds normal.  Musculoskeletal: Normal range of motion. He exhibits no edema.  Neurological: He is oriented to person, place, and time.  Skin: Skin is warm and dry. He is not diaphoretic.  Psychiatric:  Odd affect, reluctant to speak. Endorses difficulty with motivation     ED Treatments / Results  Labs (all labs ordered are listed, but only abnormal results are displayed) Labs Reviewed  COMPREHENSIVE METABOLIC PANEL - Abnormal; Notable for the following:       Result Value   Creatinine, Ser 0.59 (*)    ALT 14 (*)    All other components within normal limits  CBC - Abnormal; Notable for the following:    RBC 4.19 (*)    Hemoglobin 12.4 (*)    HCT 36.3 (*)    All other components within normal limits  ETHANOL  RAPID URINE DRUG SCREEN, HOSP PERFORMED  ACETAMINOPHEN LEVEL  SALICYLATE LEVEL    EKG  EKG Interpretation None       Radiology No results found.  Procedures Procedures (including critical care time)  Medications Ordered  in ED Medications - No data to display   Initial Impression / Assessment and Plan / ED Course  I have reviewed the triage vital signs and the nursing notes.  Pertinent labs & imaging results that were available during my care of the patient were reviewed by me and considered in my medical decision making (see chart for details).     Patient is a 26 year old male history of schizophrenia and hypogonadism presenting with trouble with motivation. She has been off his medications. Has had inpatient hospitalization for schizophrenia in the past. I'm not sure if the complete story from patient as he is reluctant to discuss. We  will get TTS consult  5:47 PM Discussed with TTS. They also believe the patient is answering questions mildly bizarrely. They're trying to get collaborative information. They talked to dad but dad yelled "he's an adult". I think we will obs himovernight and have psych re-eval the morning.  Final Clinical Impressions(s) / ED Diagnoses   Final diagnoses:  None    New Prescriptions New Prescriptions   No medications on file     Abelino DerrickMackuen, Danyiel Crespin Lyn, MD 02/26/17 1613    Abelino DerrickMackuen, Dareion Kneece Lyn, MD 02/26/17 1747

## 2017-02-26 NOTE — ED Triage Notes (Addendum)
Pt here for eval; pt sts not taking his meds and difficulty performing normal tasks and concentrating x several weeks; pt appears paranoid and thought blocking; pt admits to AH/VH; pt denies SI/HI; pt seen in [place] for same and increasing difficulty since his mother passed away several months ago

## 2017-02-26 NOTE — ED Notes (Signed)
Pt given cranberry juice, applesauce, and [place] sandwich to eat.

## 2017-02-27 LAB — SALICYLATE LEVEL: Salicylate Lvl: <7 mg/dL (ref 2.8–30.0)

## 2017-02-27 LAB — ACETAMINOPHEN LEVEL: Acetaminophen (Tylenol), Serum: <10 ug/mL — ABNORMAL LOW (ref 10–30)

## 2017-02-27 MED ORDER — RISPERIDONE 0.5 MG PO TABS
0.5000 mg | ORAL_TABLET | Freq: Two times a day (BID) | ORAL | Status: DC
  Administered 2017-02-27 – 2017-02-28 (×3): 0.5 mg via ORAL
  Filled 2017-02-27 (×3): qty 1

## 2017-02-27 MED ORDER — BENZTROPINE MESYLATE 1 MG PO TABS
0.5000 mg | ORAL_TABLET | Freq: Every day | ORAL | Status: DC
  Administered 2017-02-27 – 2017-02-28 (×2): 0.5 mg via ORAL
  Filled 2017-02-27 (×2): qty 1

## 2017-02-27 NOTE — ED Notes (Signed)
Pt did not eat breakfast. Sandwich and Sprite given as requested.

## 2017-02-27 NOTE — ED Notes (Signed)
Telepsych being performed. 

## 2017-02-27 NOTE — Consult Note (Signed)
Telepsych Consultation   Reason for Consult: Consult for [name] [name], a 26 y.o. with a history of schizophrenia admitted to the hospital with concerns about his well being.  Referring Physician:  EDP Patient Identification: [name] [name] MRN:  [id number] Principal Diagnosis: <principal problem not specified> Diagnosis:   Patient Active Problem List   Diagnosis Date Noted  . 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 male patient admitted to [facility] with concerns about his well being.  HPI: Per the assessment completed by [name] on 02/26/17, "[name] [name] is an 26 y.o. single male, who voluntarily came into [facility].  Patient reported being concerned about his well-being.  Patient reported being "not to good" and having "trouble getting stuff done."  Patient stated recent experiences with the death of his mother, who he resided with.  Patient reported having to move in the his father.  Patient reported not taking prescribed medication since 2017, due to not being able to afford it.  Patient stated use of Cannabis on 02/25/2017, however reported not using "in a long time."  Per medical records Patient has a history of Schizophrenia.  Patient denies SI/HI/AVH and access to weapons.  Patient denies SIB or aggressive behaviors.    Patient reported living with his father and sister, since the death of his mother.  Patient reports receiving no services from providers, at the present time.  Per medical records, Patient last inpatient stay was in [place].  Patient received inpatient treatment at [facility] in 2013.  During assessment, patient was cooperative.  Patient was oriented to time, place, and location (x3). Patient reported wanting to receive assistance.  Patient's cognitive functioning appeared to be fair (i.e. Patient  reported wanting to receive treatment, however was unsure what he wanted treatment for)".  On Exam: Pt was sitting in bed alert and oriented x4, patient in bed wearing hospital scrubs. Pt was in a restricted mood and affect, he stated that he came to the hospital he needed help with the way his mind has been going. He said he wants to get his mind to focus so can be able to get a job. He denies any SI/HI, but endorsing hearing voices that are saying negative things to him very often. Also, patient stated that he seeins things that are not there but was unable to describes the things he sees.   Past Psychiatric History: See H&P  Risk to Self: Suicidal Ideation: No (Pt. denies) Suicidal Intent: No Is patient at risk for suicide?: No Suicidal Plan?: No Access to Means: No What has been your use of drugs/alcohol within the last 12 months?: Cannabis How many times?: 0 Other Self Harm Risks: None Triggers for Past Attempts: None known Intentional Self Injurious Behavior: None Risk to Others: Homicidal Ideation: No (Pt. denies) Thoughts of Harm to Others: No Current Homicidal Intent: No Current Homicidal Plan: No Access to Homicidal Means: No Identified Victim: None History of harm to others?: No Assessment of Violence: On admission Violent Behavior Description: None Does patient have access to weapons?: No Criminal Charges Pending?: No Does patient have a court date: No Prior Inpatient Therapy: Prior Inpatient Therapy: Yes Prior Therapy Dates: 2013 Prior Therapy Facilty/Provider(s): [facility] Reason for Treatment: Schizophrenia Prior Outpatient Therapy: Prior Outpatient Therapy: No (Pt. denies) Prior Therapy Dates: N/A Prior Therapy Facilty/Provider(s): N/A Reason for Treatment:  N/A Does patient have an ACCT team?: No Does patient have Intensive In-House Services?  : No Does patient have [facility] services? : No Does patient have P4CC services?: No  Past Medical History:  Past  Medical History:  Diagnosis Date  . Mental disorder   . Schizophrenia [facility])     Past Surgical History:  Procedure Laterality Date  . NO PAST SURGERIES     Family History:  Family History  Problem Relation Age of Onset  . Other Mother   . Other Brother    Family Psychiatric  History: Unknown  Social History:  History  Alcohol Use  . Yes     History  Drug Use  . Types: Marijuana    Social History   Social History  . Marital status: Single    Spouse name: N/A  . Number of children: N/A  . Years of education: N/A   Social History Main Topics  . Smoking status: Current Every Day Smoker    Types: Cigarettes  . Smokeless tobacco: None  . Alcohol use Yes  . Drug use: Yes    Types: Marijuana  . Sexual activity: Not Asked   Other Topics Concern  . None   Social History Narrative  . None   Additional Social History:    Allergies:  No Known Allergies  Labs:  Results for orders placed or performed during the hospital encounter of 02/26/17 (from the past 48 hour(s))  Comprehensive metabolic panel     Status: Abnormal   Collection Time: 02/26/17  3:09 PM  Result Value Ref Range   Sodium 137 135 - 145 mmol/L   Potassium 4.1 3.5 - 5.1 mmol/L   Chloride 105 101 - 111 mmol/L   CO2 24 22 - 32 mmol/L   Glucose, Bld 79 65 - 99 mg/dL   BUN 8 6 - 20 mg/dL   Creatinine, Ser 8.71 (L) 0.61 - 1.24 mg/dL   Calcium 9.1 8.9 - 95.9 mg/dL   Total Protein 7.3 6.5 - 8.1 g/dL   Albumin 4.5 3.5 - 5.0 g/dL   AST 16 15 - 41 U/L   ALT 14 (L) 17 - 63 U/L   Alkaline Phosphatase 93 38 - 126 U/L   Total Bilirubin 1.1 0.3 - 1.2 mg/dL   GFR calc non Af Amer >60 >60 mL/min   GFR calc Af Amer >60 >60 mL/min    Comment: (NOTE) The eGFR has been calculated using the CKD EPI equation. This calculation has not been validated in all clinical situations. eGFR's persistently <60 mL/min signify possible Chronic Kidney Disease.    Anion gap 8 5 - 15  cbc     Status: Abnormal   Collection  Time: 02/26/17  3:09 PM  Result Value Ref Range   WBC 6.0 4.0 - 10.5 K/uL   RBC 4.19 (L) 4.22 - 5.81 MIL/uL   Hemoglobin 12.4 (L) 13.0 - 17.0 g/dL   HCT 74.7 (L) 18.5 - 50.1 %   MCV 86.6 78.0 - 100.0 fL   MCH 29.6 26.0 - 34.0 pg   MCHC 34.2 30.0 - 36.0 g/dL   RDW 58.6 82.5 - 74.9 %   Platelets 266 150 - 400 K/uL  Ethanol     Status: None   Collection Time: 02/26/17  3:51 PM  Result Value Ref Range   Alcohol, Ethyl (B) <5 <5 mg/dL    Comment:        LOWEST DETECTABLE LIMIT FOR SERUM ALCOHOL IS 5 mg/dL FOR MEDICAL  PURPOSES ONLY   Acetaminophen level     Status: Abnormal   Collection Time: 02/26/17  3:51 PM  Result Value Ref Range   Acetaminophen (Tylenol), Serum <10 (L) 10 - 30 ug/mL    Comment:        THERAPEUTIC CONCENTRATIONS VARY SIGNIFICANTLY. A RANGE OF 10-30 ug/mL MAY BE AN EFFECTIVE CONCENTRATION FOR MANY PATIENTS. HOWEVER, SOME ARE BEST TREATED AT CONCENTRATIONS OUTSIDE THIS RANGE. ACETAMINOPHEN CONCENTRATIONS >150 ug/mL AT 4 HOURS AFTER INGESTION AND >50 ug/mL AT 12 HOURS AFTER INGESTION ARE OFTEN ASSOCIATED WITH TOXIC REACTIONS.   Salicylate level     Status: None   Collection Time: 02/26/17  3:51 PM  Result Value Ref Range   Salicylate Lvl <2.7 2.8 - 30.0 mg/dL  Rapid urine drug screen (hospital performed)     Status: Abnormal   Collection Time: 02/26/17  6:26 PM  Result Value Ref Range   Opiates NONE DETECTED NONE DETECTED   Cocaine NONE DETECTED NONE DETECTED   Benzodiazepines NONE DETECTED NONE DETECTED   Amphetamines NONE DETECTED NONE DETECTED   Tetrahydrocannabinol POSITIVE (A) NONE DETECTED   Barbiturates NONE DETECTED NONE DETECTED    Comment:        DRUG SCREEN FOR MEDICAL PURPOSES ONLY.  IF CONFIRMATION IS NEEDED FOR ANY PURPOSE, NOTIFY LAB WITHIN 5 DAYS.        LOWEST DETECTABLE LIMITS FOR URINE DRUG SCREEN Drug Class       Cutoff (ng/mL) Amphetamine      1000 Barbiturate      200 Benzodiazepine   782 Tricyclics       423 Opiates           300 Cocaine          300 THC              50     Current Facility-Administered Medications  Medication Dose Route Frequency Provider Last Rate Last Dose  . benztropine (COGENTIN) tablet 0.5 mg  0.5 mg Oral Daily Christofer Shen A, NP      . ibuprofen (ADVIL,MOTRIN) tablet 600 mg  600 mg Oral Q8H PRN Mackuen, Courteney Lyn, MD      . ondansetron (ZOFRAN) tablet 4 mg  4 mg Oral Q8H PRN Mackuen, Courteney Lyn, MD      . risperiDONE (RISPERDAL) tablet 0.5 mg  0.5 mg Oral BID Lu Duffel, Hevin Jeffcoat A, NP       Current Outpatient Prescriptions  Medication Sig Dispense Refill  . clomiPHENE (CLOMID) 50 MG tablet Take 1 tablet (50 mg total) by mouth daily. (Patient not taking: Reported on 02/26/2017) 30 tablet 2    Musculoskeletal: UTA via telepsych  Psychiatric Specialty Exam: Physical Exam  Nursing note and vitals reviewed.   Review of Systems  All other systems reviewed and are negative.   Blood pressure 105/63, pulse (!) 53, temperature 98 F (36.7 C), temperature source Oral, resp. rate 18, SpO2 100 %.There is no height or weight on file to calculate BMI.  General Appearance: in hospital scrubs  Eye Contact:  Good  Speech:  Clear and Coherent and Normal Rate  Volume:  Normal  Mood:  labile  Affect:  Flat and Labile  Thought Process:  Coherent and Goal Directed  Orientation:  Full (Time, Place, and Person)  Thought Content:  WDL and Logical  Suicidal Thoughts:  No  Homicidal Thoughts:  No  Memory:  Immediate;   Good Recent;   Good Remote;   Fair  Judgement:  Good  Insight:  Present  Psychomotor Activity:  Normal  Concentration:  Concentration: Good and Attention Span: Good  Recall:  Good  Fund of Knowledge:  Good  Language:  Good  Akathisia:  Negative  Handed:  Right  AIMS (if indicated):     Assets:  Communication Skills Desire for Improvement Financial Resources/Insurance Housing Physical Health  ADL's:  Intact  Cognition:  WNL  Sleep:        Treatment  Plan Summary: Daily contact with patient to assess and evaluate symptoms and progress in treatment, Medication management and Plan to restart patient on Risperdal 0.'5mg'$  BID and Cogentin 0.'5mg'$  and reevaluate patient tomorrow for possible discharge.  Disposition: No evidence of imminent risk to self or others at present.   Patient does not meet criteria for psychiatric inpatient admission. Although patient currently denies any SI/HI, patient is still currently hallucinating and voices are saying negative things to him. Will reevalute patient tomorrow for medication efficacy and possible discharge with OP resources.   Vicenta Aly, NP 02/27/2017 10:50 AM

## 2017-02-27 NOTE — ED Notes (Signed)
Pt had advised not to order him lunch - Lunch tray ordered anyway. Pt ambulatory out of room and asked another pt's sitter if lunch was coming. RN advised pt yes - it has been ordered.

## 2017-02-27 NOTE — ED Notes (Signed)
Pt standing behind door in room - states he was getting bored lying on the bed. Pt returned to bed as asked.

## 2017-02-27 NOTE — ED Notes (Addendum)
Denies SI/HI - states wants outpt tx. States came to ED d/t his father did not want him lying around his house all day. States he wants to get back on his med - Clomid - states has been off of it x 2 years. States he has been feeing paranoid.

## 2017-02-27 NOTE — ED Provider Notes (Signed)
Resting comfortably. No complaint   [name] [name], [name] Berish, MD 02/27/17 [phone]

## 2017-02-27 NOTE — ED Notes (Signed)
RN attempted to call Lab x 3 - unable to get through. Candace, Secretary, advised she spoke w/Lab who will add on.

## 2017-02-28 DIAGNOSIS — F17210 Nicotine dependence, cigarettes, uncomplicated: Secondary | ICD-10-CM

## 2017-02-28 DIAGNOSIS — Z8659 Personal history of other mental and behavioral disorders: Secondary | ICD-10-CM

## 2017-02-28 DIAGNOSIS — F1290 Cannabis use, unspecified, uncomplicated: Secondary | ICD-10-CM | POA: Diagnosis not present

## 2017-02-28 MED ORDER — RISPERIDONE 0.5 MG PO TABS: 0.5000 mg | ORAL_TABLET | Freq: Two times a day (BID) | ORAL | 0 refills | Status: DC

## 2017-02-28 MED ORDER — BENZTROPINE MESYLATE 0.5 MG PO TABS: 0.5000 mg | ORAL_TABLET | Freq: Every day | ORAL | 0 refills | Status: DC

## 2017-02-28 NOTE — Discharge Instructions (Signed)
Follow-up as per behavioral health. Return for any new or worse symptoms. 

## 2017-02-28 NOTE — Consult Note (Signed)
Telepsych Consultation   Reason for Consult: Consult for [name] [name], a 26 y.o. with a history of schizophrenia admitted to the hospital with concerns about his well being.  Referring Physician:  EDP Patient Identification: [name] [name] MRN:  [id number] Principal Diagnosis: <principal problem not specified> Diagnosis:   Patient Active Problem List   Diagnosis Date Noted  . 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 male patient admitted to [facility] with concerns about his well being.  HPI: Per the assessment completed by [name] [name] on 02/26/17, "[name] [name] is an 26 y.o. single male, who voluntarily came into [facility].  Patient reported being concerned about his well-being.  Patient reported being "not to good" and having "trouble getting stuff done."  Patient stated recent experiences with the death of his mother, who he resided with.  Patient reported having to move in the his father.  Patient reported not taking prescribed medication since 2017, due to not being able to afford it.  Patient stated use of Cannabis on 02/25/2017, however reported not using "in a long time."  Per medical records Patient has a history of Schizophrenia.  Patient denies SI/HI/AVH and access to weapons.  Patient denies SIB or aggressive behaviors.    Patient reported living with his father and sister, since the death of his mother.  Patient reports receiving no services from providers, at the present time.  Per medical records, Patient last inpatient stay was in [place].  Patient received inpatient treatment at [facility] in 2013.  During assessment, patient was cooperative.  Patient was oriented to time, place, and location (x3). Patient reported wanting to receive assistance.  Patient's cognitive functioning appeared to be fair (i.e. Patient  reported wanting to receive treatment, however was unsure what he wanted treatment for)".  On Exam yesterday: Pt was sitting in bed alert and oriented x4, patient in bed wearing hospital scrubs. Pt was in a restricted mood and affect, he stated that he came to the hospital he needed help with the way his mind has been going. He said he wants to get his mind to focus so can be able to get a job. He denies any SI/HI, but endorsing hearing voices that are saying negative things to him very often. Also, patient stated that he seeins things that are not there but was unable to describes the things he sees.   On Exam today: Patient was sitting in bed awake, alert and oriented x4. Pt moody remains restricted with a congruent affect. But patient remains calm, cooperative and compliant with this Clinical research associate and answers questions logically. Patient continues to deny any SI/HI and also denied any VAH at this time. Patient said that he does not feel like he is ready to go home because when he gets home he is alone and lonely. However, this Clinical research associate encouraged patient and educated about reasons why people stay in the hospital which does not include because they are lonely. patient understands that hospital is for acute stability and he agrees that he indeed will return home at some point. He also agrees to continue to take his medications at home and to follow up with OP appointments as recommended. Patient verbalizes his understanding and said he will contact his dad to home and pick him up whenever it's time for discharge.  Past Psychiatric History: See H&P  Risk  to Self: Suicidal Ideation: No (Pt. denies) Suicidal Intent: No Is patient at risk for suicide?: No Suicidal Plan?: No Access to Means: No What has been your use of drugs/alcohol within the last 12 months?: Cannabis How many times?: 0 Other Self Harm Risks: None Triggers for Past Attempts: None known Intentional Self Injurious Behavior: None Risk to Others:  Homicidal Ideation: No (Pt. denies) Thoughts of Harm to Others: No Current Homicidal Intent: No Current Homicidal Plan: No Access to Homicidal Means: No Identified Victim: None History of harm to others?: No Assessment of Violence: On admission Violent Behavior Description: None Does patient have access to weapons?: No Criminal Charges Pending?: No Does patient have a court date: No Prior Inpatient Therapy: Prior Inpatient Therapy: Yes Prior Therapy Dates: 2013 Prior Therapy Facilty/Provider(s): [facility] Reason for Treatment: Schizophrenia Prior Outpatient Therapy: Prior Outpatient Therapy: No (Pt. denies) Prior Therapy Dates: N/A Prior Therapy Facilty/Provider(s): N/A Reason for Treatment: N/A Does patient have an ACCT team?: No Does patient have Intensive In-House Services?  : No Does patient have [facility] services? : No Does patient have P4CC services?: No  Past Medical History:  Past Medical History:  Diagnosis Date  . Mental disorder   . Schizophrenia [facility])     Past Surgical History:  Procedure Laterality Date  . NO PAST SURGERIES     Family History:  Family History  Problem Relation Age of Onset  . Other Mother   . Other Brother    Family Psychiatric  History: Unknown  Social History:  History  Alcohol Use  . Yes     History  Drug Use  . Types: Marijuana    Social History   Social History  . Marital status: Single    Spouse name: N/A  . Number of children: N/A  . Years of education: N/A   Social History Main Topics  . Smoking status: Current Every Day Smoker    Types: Cigarettes  . Smokeless tobacco: None  . Alcohol use Yes  . Drug use: Yes    Types: Marijuana  . Sexual activity: Not Asked   Other Topics Concern  . None   Social History Narrative  . None   Additional Social History:    Allergies:  No Known Allergies  Labs:  Results for orders placed or performed during the hospital encounter of 02/26/17 (from the past 48 hour(s))   Comprehensive metabolic panel     Status: Abnormal   Collection Time: 02/26/17  3:09 PM  Result Value Ref Range   Sodium 137 135 - 145 mmol/L   Potassium 4.1 3.5 - 5.1 mmol/L   Chloride 105 101 - 111 mmol/L   CO2 24 22 - 32 mmol/L   Glucose, Bld 79 65 - 99 mg/dL   BUN 8 6 - 20 mg/dL   Creatinine, Ser 0.59 (L) 0.61 - 1.24 mg/dL   Calcium 9.1 8.9 - 10.3 mg/dL   Total Protein 7.3 6.5 - 8.1 g/dL   Albumin 4.5 3.5 - 5.0 g/dL   AST 16 15 - 41 U/L   ALT 14 (L) 17 - 63 U/L   Alkaline Phosphatase 93 38 - 126 U/L   Total Bilirubin 1.1 0.3 - 1.2 mg/dL   GFR calc non Af Amer >60 >60 mL/min   GFR calc Af Amer >60 >60 mL/min    Comment: (NOTE) The eGFR has been calculated using the CKD EPI equation. This calculation has not been validated in all clinical situations. eGFR's persistently <60 mL/min signify  possible Chronic Kidney Disease.    Anion gap 8 5 - 15  cbc     Status: Abnormal   Collection Time: 02/26/17  3:09 PM  Result Value Ref Range   WBC 6.0 4.0 - 10.5 K/uL   RBC 4.19 (L) 4.22 - 5.81 MIL/uL   Hemoglobin 12.4 (L) 13.0 - 17.0 g/dL   HCT 36.3 (L) 39.0 - 52.0 %   MCV 86.6 78.0 - 100.0 fL   MCH 29.6 26.0 - 34.0 pg   MCHC 34.2 30.0 - 36.0 g/dL   RDW 12.7 11.5 - 15.5 %   Platelets 266 150 - 400 K/uL  Ethanol     Status: None   Collection Time: 02/26/17  3:51 PM  Result Value Ref Range   Alcohol, Ethyl (B) <5 <5 mg/dL    Comment:        LOWEST DETECTABLE LIMIT FOR SERUM ALCOHOL IS 5 mg/dL FOR MEDICAL PURPOSES ONLY   Acetaminophen level     Status: Abnormal   Collection Time: 02/26/17  3:51 PM  Result Value Ref Range   Acetaminophen (Tylenol), Serum <10 (L) 10 - 30 ug/mL    Comment:        THERAPEUTIC CONCENTRATIONS VARY SIGNIFICANTLY. A RANGE OF 10-30 ug/mL MAY BE AN EFFECTIVE CONCENTRATION FOR MANY PATIENTS. HOWEVER, SOME ARE BEST TREATED AT CONCENTRATIONS OUTSIDE THIS RANGE. ACETAMINOPHEN CONCENTRATIONS >150 ug/mL AT 4 HOURS AFTER INGESTION AND >50 ug/mL AT  12 HOURS AFTER INGESTION ARE OFTEN ASSOCIATED WITH TOXIC REACTIONS.   Salicylate level     Status: None   Collection Time: 02/26/17  3:51 PM  Result Value Ref Range   Salicylate Lvl <6.7 2.8 - 30.0 mg/dL  Rapid urine drug screen (hospital performed)     Status: Abnormal   Collection Time: 02/26/17  6:26 PM  Result Value Ref Range   Opiates NONE DETECTED NONE DETECTED   Cocaine NONE DETECTED NONE DETECTED   Benzodiazepines NONE DETECTED NONE DETECTED   Amphetamines NONE DETECTED NONE DETECTED   Tetrahydrocannabinol POSITIVE (A) NONE DETECTED   Barbiturates NONE DETECTED NONE DETECTED    Comment:        DRUG SCREEN FOR MEDICAL PURPOSES ONLY.  IF CONFIRMATION IS NEEDED FOR ANY PURPOSE, NOTIFY LAB WITHIN 5 DAYS.        LOWEST DETECTABLE LIMITS FOR URINE DRUG SCREEN Drug Class       Cutoff (ng/mL) Amphetamine      1000 Barbiturate      200 Benzodiazepine   893 Tricyclics       810 Opiates          300 Cocaine          300 THC              50     Current Facility-Administered Medications  Medication Dose Route Frequency Provider Last Rate Last Dose  . benztropine (COGENTIN) tablet 0.5 mg  0.5 mg Oral Daily Okonkwo, Justina A, NP   0.5 mg at 02/28/17 1038  . ibuprofen (ADVIL,MOTRIN) tablet 600 mg  600 mg Oral Q8H PRN Mackuen, Courteney Lyn, MD      . ondansetron (ZOFRAN) tablet 4 mg  4 mg Oral Q8H PRN Mackuen, Courteney Lyn, MD      . risperiDONE (RISPERDAL) tablet 0.5 mg  0.5 mg Oral BID Okonkwo, Justina A, NP   0.5 mg at 02/28/17 1038   Current Outpatient Prescriptions  Medication Sig Dispense Refill  . clomiPHENE (CLOMID) 50 MG tablet Take 1 tablet (  50 mg total) by mouth daily. (Patient not taking: Reported on 02/26/2017) 30 tablet 2    Musculoskeletal: UTA via telepsych  Psychiatric Specialty Exam: Physical Exam  Nursing note and vitals reviewed.   Review of Systems  All other systems reviewed and are negative.   Blood pressure 116/63, pulse 70, temperature 98.1  F (36.7 C), temperature source Oral, resp. rate 12, SpO2 100 %.There is no height or weight on file to calculate BMI.  General Appearance: in hospital scrubs  Eye Contact:  Good  Speech:  Clear and Coherent and Normal Rate  Volume:  Normal  Mood:  labile  Affect:  Flat and Labile  Thought Process:  Coherent and Goal Directed  Orientation:  Full (Time, Place, and Person)  Thought Content:  WDL and Logical  Suicidal Thoughts:  No  Homicidal Thoughts:  No  Memory:  Immediate;   Good Recent;   Good Remote;   Fair  Judgement:  Good  Insight:  Present  Psychomotor Activity:  Normal  Concentration:  Concentration: Good and Attention Span: Good  Recall:  Good  Fund of Knowledge:  Good  Language:  Good  Akathisia:  Negative  Handed:  Right  AIMS (if indicated):     Assets:  Communication Skills Desire for Improvement Financial Resources/Insurance Housing Physical Health  ADL's:  Intact  Cognition:  WNL  Sleep:        Treatment Plan Summary: Pt is tolerating his medication and is in agreement to continue taking meds as prescribed upon discharge. Patient also agrees to follow up with OP from home. He continues to deny any SI/HI at this time.  Disposition: No evidence of imminent risk to self or others at present.   Patient does not meet criteria for psychiatric inpatient admission. Supportive therapy provided about ongoing stressors. Discussed crisis plan, support from social network, calling 911, coming to the Emergency Department, and calling Suicide Hotline.  Discharge home when medically cleared.  Vicenta Aly, NP 02/28/2017 11:29 AM

## 2017-02-28 NOTE — ED Notes (Signed)
Dr Deretha EmoryZackowski advised will print pt's rx's for meds started in ED - Cogentin and Risperdal. Advised may be approx 30 min.

## 2017-02-28 NOTE — ED Notes (Signed)
Pt aware of tx plan - d/c to home. Pt attempted to call his father on phone - no answer.

## 2017-02-28 NOTE — ED Notes (Signed)
Outpt resources given - ALL belongings returned to pt -1 labeled belongings bag and 1 valuables envelope - pt signed verifying all items present.

## 2017-02-28 NOTE — Progress Notes (Signed)
Pt has been reassessed and no longer requires inpatient treatment per Leighton Ruffina Okonkwo, NP.  [facility] Nurse, Becky, notified.  Timmothy EulerJean T. Kaylyn LimSutter, MSW, LCSWA Clinical Social Work Disposition [phone]

## 2017-02-28 NOTE — ED Notes (Signed)
Pt belongings accounted for, located in locker #6.

## 2017-02-28 NOTE — ED Notes (Signed)
Pt spoke w/his father on phone who asked to speak w/RN. Father asking "Why do I have to come pick him up when he's a grown man?" Advised father he did not have to do so - staff was wanting to ensure safe d/c for pt and that a family member is aware of him being d/c'd. Father then spoke back w/pt. Pt advised RN he will be riding bus home.

## 2017-07-16 ENCOUNTER — Emergency Department (HOSPITAL_COMMUNITY): Payer: Medicaid Other | Admitting: Certified Registered Nurse Anesthetist

## 2017-07-16 ENCOUNTER — Emergency Department (HOSPITAL_COMMUNITY): Payer: Medicaid Other

## 2017-07-16 ENCOUNTER — Inpatient Hospital Stay (HOSPITAL_COMMUNITY)
Admission: EM | Admit: 2017-07-16 | Discharge: 2017-07-22 | DRG: 343 | Disposition: A | Discharge status: Home / Self Care | Payer: Medicaid Other | Attending: General Surgery | Admitting: General Surgery

## 2017-07-16 ENCOUNTER — Encounter (HOSPITAL_COMMUNITY): Admission: EM | Disposition: A | Discharge status: Home / Self Care | Payer: Self-pay | Source: Home / Self Care

## 2017-07-16 ENCOUNTER — Encounter (HOSPITAL_COMMUNITY): Payer: Self-pay | Admitting: *Deleted

## 2017-07-16 DIAGNOSIS — F29 Unspecified psychosis not due to a substance or known physiological condition: Secondary | ICD-10-CM | POA: Diagnosis present

## 2017-07-16 DIAGNOSIS — E291 Testicular hypofunction: Secondary | ICD-10-CM | POA: Diagnosis present

## 2017-07-16 DIAGNOSIS — K3532 Acute appendicitis with perforation and localized peritonitis, without abscess: Secondary | ICD-10-CM | POA: Diagnosis present

## 2017-07-16 DIAGNOSIS — K352 Acute appendicitis with generalized peritonitis, without abscess: Principal | ICD-10-CM | POA: Diagnosis present

## 2017-07-16 DIAGNOSIS — R000 Tachycardia, unspecified: Secondary | ICD-10-CM | POA: Diagnosis present

## 2017-07-16 DIAGNOSIS — Z9049 Acquired absence of other specified parts of digestive tract: Secondary | ICD-10-CM

## 2017-07-16 DIAGNOSIS — Z87891 Personal history of nicotine dependence: Secondary | ICD-10-CM

## 2017-07-16 DIAGNOSIS — F209 Schizophrenia, unspecified: Secondary | ICD-10-CM | POA: Diagnosis present

## 2017-07-16 DIAGNOSIS — Z23 Encounter for immunization: Secondary | ICD-10-CM

## 2017-07-16 HISTORY — PX: LAPAROSCOPIC APPENDECTOMY: SHX408

## 2017-07-16 HISTORY — PX: LAPAROTOMY: SHX154

## 2017-07-16 LAB — COMPREHENSIVE METABOLIC PANEL
ALT: 21 U/L (ref 17–63)
AST: 26 U/L (ref 15–41)
Albumin: 4 g/dL (ref 3.5–5.0)
Alkaline Phosphatase: 100 U/L (ref 38–126)
Anion gap: 12 (ref 5–15)
BUN: 17 mg/dL (ref 6–20)
CO2: 24 mmol/L (ref 22–32)
Calcium: 9.2 mg/dL (ref 8.9–10.3)
Chloride: 94 mmol/L — ABNORMAL LOW (ref 101–111)
Creatinine, Ser: 0.86 mg/dL (ref 0.61–1.24)
GFR calc Af Amer: >60 mL/min (ref >60)
GFR calc non Af Amer: >60 mL/min (ref >60)
Glucose, Bld: 139 mg/dL — ABNORMAL HIGH (ref 65–99)
Potassium: 3.7 mmol/L (ref 3.5–5.1)
Sodium: 130 mmol/L — ABNORMAL LOW (ref 135–145)
Total Bilirubin: 1.6 mg/dL — ABNORMAL HIGH (ref 0.3–1.2)
Total Protein: 7.7 g/dL (ref 6.5–8.1)

## 2017-07-16 LAB — URINALYSIS, ROUTINE W REFLEX MICROSCOPIC
Bacteria, UA: NONE SEEN
Bilirubin Urine: NEGATIVE
Glucose, UA: NEGATIVE mg/dL
Ketones, ur: NEGATIVE mg/dL
Leukocytes, UA: NEGATIVE
Nitrite: NEGATIVE
Protein, ur: NEGATIVE mg/dL
Specific Gravity, Urine: 1.008 (ref 1.005–1.030)
pH: 6 (ref 5.0–8.0)

## 2017-07-16 LAB — RAPID URINE DRUG SCREEN, HOSP PERFORMED
Amphetamines: NOT DETECTED
Barbiturates: NOT DETECTED
Benzodiazepines: POSITIVE — AB
Cocaine: NOT DETECTED
Opiates: POSITIVE — AB
Tetrahydrocannabinol: NOT DETECTED

## 2017-07-16 LAB — CBC
HCT: 42.3 % (ref 39.0–52.0)
Hemoglobin: 15.2 g/dL (ref 13.0–17.0)
MCH: 30.8 pg (ref 26.0–34.0)
MCHC: 35.9 g/dL (ref 30.0–36.0)
MCV: 85.6 fL (ref 78.0–100.0)
Platelets: 300 10*3/uL (ref 150–400)
RBC: 4.94 MIL/uL (ref 4.22–5.81)
RDW: 12.3 % (ref 11.5–15.5)
WBC: 14.6 10*3/uL — ABNORMAL HIGH (ref 4.0–10.5)

## 2017-07-16 LAB — LIPASE, BLOOD: Lipase: 14 U/L (ref 11–51)

## 2017-07-16 LAB — I-STAT CG4 LACTIC ACID, ED: Lactic Acid, Venous: 1.69 mmol/L (ref 0.5–1.9)

## 2017-07-16 SURGERY — APPENDECTOMY, LAPAROSCOPIC
Anesthesia: General | Site: Abdomen

## 2017-07-16 MED ORDER — HYDROMORPHONE HCL 1 MG/ML IJ SOLN
INTRAMUSCULAR | Status: AC
  Administered 2017-07-16: 0.5 mg via INTRAVENOUS
  Filled 2017-07-16: qty 1

## 2017-07-16 MED ORDER — DEXAMETHASONE SODIUM PHOSPHATE 10 MG/ML IJ SOLN
INTRAMUSCULAR | Status: AC
  Filled 2017-07-16: qty 1

## 2017-07-16 MED ORDER — SODIUM CHLORIDE 0.9 % IR SOLN
Status: DC | PRN
  Administered 2017-07-16: 1000 mL

## 2017-07-16 MED ORDER — EPHEDRINE 5 MG/ML INJ
INTRAVENOUS | Status: AC
  Filled 2017-07-16: qty 10

## 2017-07-16 MED ORDER — SODIUM CHLORIDE 0.9 % IV BOLUS (SEPSIS)
1000.0000 mL | Freq: Once | INTRAVENOUS | Status: AC
  Administered 2017-07-16: 1000 mL via INTRAVENOUS

## 2017-07-16 MED ORDER — HYDROMORPHONE HCL 1 MG/ML IJ SOLN
0.5000 mg | INTRAMUSCULAR | Status: DC | PRN
  Administered 2017-07-16 – 2017-07-19 (×9): 1 mg via INTRAVENOUS
  Administered 2017-07-19: 0.5 mg via INTRAVENOUS
  Administered 2017-07-19: 1 mg via INTRAVENOUS
  Filled 2017-07-16 (×11): qty 1

## 2017-07-16 MED ORDER — DEXTROSE-NACL 5-0.9 % IV SOLN
INTRAVENOUS | Status: DC
  Administered 2017-07-16 – 2017-07-17 (×4): via INTRAVENOUS
  Administered 2017-07-18: 1 mL via INTRAVENOUS
  Administered 2017-07-18 (×2): via INTRAVENOUS
  Administered 2017-07-19: 1 mL via INTRAVENOUS

## 2017-07-16 MED ORDER — IOPAMIDOL (ISOVUE-300) INJECTION 61%
INTRAVENOUS | Status: AC
  Administered 2017-07-16: 75 mL via INTRAVENOUS
  Filled 2017-07-16: qty 75

## 2017-07-16 MED ORDER — CLOMIPHENE CITRATE 50 MG PO TABS: 50.0000 mg | ORAL_TABLET | Freq: Every day | ORAL | Status: DC

## 2017-07-16 MED ORDER — ONDANSETRON 4 MG PO TBDP
4.0000 mg | ORAL_TABLET | Freq: Four times a day (QID) | ORAL | Status: DC | PRN
  Administered 2017-07-21: 4 mg via ORAL
  Filled 2017-07-16: qty 1

## 2017-07-16 MED ORDER — RISPERIDONE 0.5 MG PO TABS: 0.5000 mg | ORAL_TABLET | Freq: Two times a day (BID) | ORAL | Status: DC

## 2017-07-16 MED ORDER — PIPERACILLIN-TAZOBACTAM 3.375 G IVPB
3.3750 g | Freq: Three times a day (TID) | INTRAVENOUS | Status: DC
  Administered 2017-07-16 – 2017-07-22 (×17): 3.375 g via INTRAVENOUS
  Filled 2017-07-16 (×18): qty 50

## 2017-07-16 MED ORDER — ONDANSETRON HCL 4 MG/2ML IJ SOLN
4.0000 mg | Freq: Four times a day (QID) | INTRAMUSCULAR | Status: DC | PRN
  Administered 2017-07-18: 4 mg via INTRAVENOUS
  Filled 2017-07-16: qty 2

## 2017-07-16 MED ORDER — ONDANSETRON HCL 4 MG/2ML IJ SOLN: 4.0000 mg | Freq: Once | INTRAMUSCULAR | Status: DC | PRN

## 2017-07-16 MED ORDER — BENZTROPINE MESYLATE 0.5 MG PO TABS: 0.5000 mg | ORAL_TABLET | Freq: Every day | ORAL | Status: DC

## 2017-07-16 MED ORDER — ONDANSETRON HCL 4 MG/2ML IJ SOLN
4.0000 mg | Freq: Once | INTRAMUSCULAR | Status: AC
  Administered 2017-07-16: 4 mg via INTRAVENOUS
  Filled 2017-07-16: qty 2

## 2017-07-16 MED ORDER — MIDAZOLAM HCL 2 MG/2ML IJ SOLN
INTRAMUSCULAR | Status: DC | PRN
  Administered 2017-07-16 (×2): 1 mg via INTRAVENOUS

## 2017-07-16 MED ORDER — BUPIVACAINE HCL (PF) 0.25 % IJ SOLN
INTRAMUSCULAR | Status: AC
  Filled 2017-07-16: qty 30

## 2017-07-16 MED ORDER — SUCCINYLCHOLINE CHLORIDE 20 MG/ML IJ SOLN
INTRAMUSCULAR | Status: DC | PRN
  Administered 2017-07-16: 140 mg via INTRAVENOUS

## 2017-07-16 MED ORDER — DEXAMETHASONE SODIUM PHOSPHATE 10 MG/ML IJ SOLN
INTRAMUSCULAR | Status: DC | PRN
  Administered 2017-07-16: 10 mg via INTRAVENOUS

## 2017-07-16 MED ORDER — MEPERIDINE HCL 25 MG/ML IJ SOLN: 6.2500 mg | INTRAMUSCULAR | Status: DC | PRN

## 2017-07-16 MED ORDER — ENOXAPARIN SODIUM 40 MG/0.4ML ~~LOC~~ SOLN
40.0000 mg | SUBCUTANEOUS | Status: DC
  Administered 2017-07-17 – 2017-07-22 (×6): 40 mg via SUBCUTANEOUS
  Filled 2017-07-16 (×7): qty 0.4

## 2017-07-16 MED ORDER — SUCCINYLCHOLINE CHLORIDE 200 MG/10ML IV SOSY
PREFILLED_SYRINGE | INTRAVENOUS | Status: AC
  Filled 2017-07-16: qty 10

## 2017-07-16 MED ORDER — 0.9 % SODIUM CHLORIDE (POUR BTL) OPTIME
TOPICAL | Status: DC | PRN
  Administered 2017-07-16: 1000 mL

## 2017-07-16 MED ORDER — ACETAMINOPHEN 10 MG/ML IV SOLN
INTRAVENOUS | Status: DC | PRN
  Administered 2017-07-16: 1000 mg via INTRAVENOUS

## 2017-07-16 MED ORDER — OXYCODONE HCL 5 MG PO TABS
5.0000 mg | ORAL_TABLET | ORAL | Status: DC | PRN
  Administered 2017-07-17 – 2017-07-20 (×7): 10 mg via ORAL
  Filled 2017-07-16 (×10): qty 2

## 2017-07-16 MED ORDER — PHENYLEPHRINE 40 MCG/ML (10ML) SYRINGE FOR IV PUSH (FOR BLOOD PRESSURE SUPPORT)
PREFILLED_SYRINGE | INTRAVENOUS | Status: DC | PRN
  Administered 2017-07-16 (×2): 80 ug via INTRAVENOUS

## 2017-07-16 MED ORDER — ROCURONIUM BROMIDE 10 MG/ML (PF) SYRINGE
PREFILLED_SYRINGE | INTRAVENOUS | Status: DC | PRN
  Administered 2017-07-16: 40 mg via INTRAVENOUS

## 2017-07-16 MED ORDER — HYDROMORPHONE HCL 1 MG/ML IJ SOLN
0.2500 mg | INTRAMUSCULAR | Status: DC | PRN
  Administered 2017-07-16 (×4): 0.5 mg via INTRAVENOUS

## 2017-07-16 MED ORDER — PIPERACILLIN-TAZOBACTAM 3.375 G IVPB 30 MIN
3.3750 g | Freq: Once | INTRAVENOUS | Status: AC
  Administered 2017-07-16: 3.375 g via INTRAVENOUS
  Filled 2017-07-16: qty 50

## 2017-07-16 MED ORDER — LACTATED RINGERS IV SOLN
INTRAVENOUS | Status: DC
  Administered 2017-07-16: 17:00:00 via INTRAVENOUS

## 2017-07-16 MED ORDER — BUPIVACAINE HCL 0.25 % IJ SOLN
INTRAMUSCULAR | Status: DC | PRN
  Administered 2017-07-16: 30 mL

## 2017-07-16 MED ORDER — LIDOCAINE 2% (20 MG/ML) 5 ML SYRINGE
INTRAMUSCULAR | Status: AC
  Filled 2017-07-16: qty 5

## 2017-07-16 MED ORDER — MORPHINE SULFATE (PF) 4 MG/ML IV SOLN
4.0000 mg | Freq: Once | INTRAVENOUS | Status: AC
  Administered 2017-07-16: 4 mg via INTRAVENOUS
  Filled 2017-07-16: qty 1

## 2017-07-16 MED ORDER — ONDANSETRON HCL 4 MG/2ML IJ SOLN
INTRAMUSCULAR | Status: DC | PRN
  Administered 2017-07-16: 4 mg via INTRAVENOUS

## 2017-07-16 MED ORDER — FENTANYL CITRATE (PF) 250 MCG/5ML IJ SOLN
INTRAMUSCULAR | Status: AC
  Filled 2017-07-16: qty 5

## 2017-07-16 MED ORDER — ONDANSETRON HCL 4 MG/2ML IJ SOLN: 4.0000 mg | Freq: Four times a day (QID) | INTRAMUSCULAR | Status: DC | PRN

## 2017-07-16 MED ORDER — ACETAMINOPHEN 10 MG/ML IV SOLN
INTRAVENOUS | Status: AC
  Filled 2017-07-16: qty 100

## 2017-07-16 MED ORDER — PROPOFOL 10 MG/ML IV BOLUS
INTRAVENOUS | Status: AC
  Filled 2017-07-16: qty 20

## 2017-07-16 MED ORDER — PIPERACILLIN-TAZOBACTAM 3.375 G IVPB: 3.3750 g | Freq: Three times a day (TID) | INTRAVENOUS | Status: DC

## 2017-07-16 MED ORDER — PROPOFOL 10 MG/ML IV BOLUS
INTRAVENOUS | Status: DC | PRN
  Administered 2017-07-16: 170 mg via INTRAVENOUS

## 2017-07-16 MED ORDER — ONDANSETRON HCL 4 MG/2ML IJ SOLN
INTRAMUSCULAR | Status: AC
  Filled 2017-07-16: qty 2

## 2017-07-16 MED ORDER — MIDAZOLAM HCL 2 MG/2ML IJ SOLN
INTRAMUSCULAR | Status: AC
  Filled 2017-07-16: qty 2

## 2017-07-16 MED ORDER — LIDOCAINE 2% (20 MG/ML) 5 ML SYRINGE
INTRAMUSCULAR | Status: DC | PRN
  Administered 2017-07-16: 60 mg via INTRAVENOUS

## 2017-07-16 MED ORDER — SUGAMMADEX SODIUM 200 MG/2ML IV SOLN
INTRAVENOUS | Status: DC | PRN
  Administered 2017-07-16: 200 mg via INTRAVENOUS

## 2017-07-16 MED ORDER — PHENYLEPHRINE 40 MCG/ML (10ML) SYRINGE FOR IV PUSH (FOR BLOOD PRESSURE SUPPORT)
PREFILLED_SYRINGE | INTRAVENOUS | Status: AC
  Filled 2017-07-16: qty 10

## 2017-07-16 MED ORDER — SIMETHICONE 80 MG PO CHEW
40.0000 mg | CHEWABLE_TABLET | Freq: Four times a day (QID) | ORAL | Status: DC | PRN
  Administered 2017-07-17: 40 mg via ORAL
  Filled 2017-07-16 (×2): qty 1

## 2017-07-16 MED ORDER — FENTANYL CITRATE (PF) 250 MCG/5ML IJ SOLN
INTRAMUSCULAR | Status: DC | PRN
  Administered 2017-07-16: 150 ug via INTRAVENOUS

## 2017-07-16 MED ORDER — SODIUM CHLORIDE 0.9 % IV SOLN
INTRAVENOUS | Status: DC
  Administered 2017-07-16 – 2017-07-20 (×2): via INTRAVENOUS

## 2017-07-16 SURGICAL SUPPLY — 72 items
APPLIER CLIP 5 13 M/L LIGAMAX5 (MISCELLANEOUS)
BENZOIN TINCTURE PRP APPL 2/3 (GAUZE/BANDAGES/DRESSINGS) ×2 IMPLANT
BLADE CLIPPER SURG (BLADE) IMPLANT
CANISTER SUCT 3000ML PPV (MISCELLANEOUS) ×2 IMPLANT
CHLORAPREP W/TINT 26ML (MISCELLANEOUS) ×2 IMPLANT
CLIP APPLIE 5 13 M/L LIGAMAX5 (MISCELLANEOUS) IMPLANT
COVER SURGICAL LIGHT HANDLE (MISCELLANEOUS) ×2 IMPLANT
COVER TRANSDUCER ULTRASND (DRAPES) ×2 IMPLANT
DRAIN CHANNEL 19F RND (DRAIN) ×2 IMPLANT
DRAPE LAPAROSCOPIC ABDOMINAL (DRAPES) ×2 IMPLANT
DRAPE WARM FLUID 44X44 (DRAPE) ×2 IMPLANT
DRSG OPSITE POSTOP 4X10 (GAUZE/BANDAGES/DRESSINGS) IMPLANT
DRSG OPSITE POSTOP 4X8 (GAUZE/BANDAGES/DRESSINGS) IMPLANT
ELECT BLADE 6.5 EXT (BLADE) IMPLANT
ELECT CAUTERY BLADE 6.4 (BLADE) ×2 IMPLANT
ELECT REM PT RETURN 9FT ADLT (ELECTROSURGICAL) ×2
ELECTRODE REM PT RTRN 9FT ADLT (ELECTROSURGICAL) ×1 IMPLANT
ENDOLOOP SUT PDS II  0 18 (SUTURE) ×3
ENDOLOOP SUT PDS II 0 18 (SUTURE) ×3 IMPLANT
EVACUATOR SILICONE 100CC (DRAIN) ×2 IMPLANT
GAUZE SPONGE 2X2 8PLY STRL LF (GAUZE/BANDAGES/DRESSINGS) ×1 IMPLANT
GAUZE SPONGE 4X4 12PLY STRL (GAUZE/BANDAGES/DRESSINGS) ×2 IMPLANT
GLOVE BIO SURGEON STRL SZ 6.5 (GLOVE) ×2 IMPLANT
GLOVE BIO SURGEON STRL SZ7.5 (GLOVE) ×2 IMPLANT
GLOVE BIOGEL PI IND STRL 6 (GLOVE) ×1 IMPLANT
GLOVE BIOGEL PI IND STRL 7.0 (GLOVE) ×2 IMPLANT
GLOVE BIOGEL PI IND STRL 8 (GLOVE) ×1 IMPLANT
GLOVE BIOGEL PI INDICATOR 6 (GLOVE) ×1
GLOVE BIOGEL PI INDICATOR 7.0 (GLOVE) ×2
GLOVE BIOGEL PI INDICATOR 8 (GLOVE) ×1
GLOVE INDICATOR 6.5 STRL GRN (GLOVE) ×2 IMPLANT
GLOVE SURG SS PI 7.0 STRL IVOR (GLOVE) ×4 IMPLANT
GOWN STRL REUS W/ TWL LRG LVL3 (GOWN DISPOSABLE) ×2 IMPLANT
GOWN STRL REUS W/ TWL XL LVL3 (GOWN DISPOSABLE) ×1 IMPLANT
GOWN STRL REUS W/TWL LRG LVL3 (GOWN DISPOSABLE) ×2
GOWN STRL REUS W/TWL XL LVL3 (GOWN DISPOSABLE) ×1
GRASPER SUT TROCAR 14GX15 (MISCELLANEOUS) ×4 IMPLANT
KIT BASIN OR (CUSTOM PROCEDURE TRAY) ×2 IMPLANT
KIT ROOM TURNOVER OR (KITS) ×2 IMPLANT
LIGASURE IMPACT 36 18CM CVD LR (INSTRUMENTS) IMPLANT
NEEDLE INSUFFLATION 14GA 120MM (NEEDLE) ×2 IMPLANT
NS IRRIG 1000ML POUR BTL (IV SOLUTION) ×4 IMPLANT
PACK GENERAL/GYN (CUSTOM PROCEDURE TRAY) ×2 IMPLANT
PAD ARMBOARD 7.5X6 YLW CONV (MISCELLANEOUS) ×4 IMPLANT
POUCH RETRIEVAL ECOSAC 10 (ENDOMECHANICALS) ×1 IMPLANT
POUCH RETRIEVAL ECOSAC 10MM (ENDOMECHANICALS) ×1
SCISSORS LAP 5X35 DISP (ENDOMECHANICALS) ×2 IMPLANT
SET IRRIG TUBING LAPAROSCOPIC (IRRIGATION / IRRIGATOR) ×2 IMPLANT
SLEEVE ENDOPATH XCEL 5M (ENDOMECHANICALS) ×2 IMPLANT
SPECIMEN JAR LARGE (MISCELLANEOUS) IMPLANT
SPECIMEN JAR SMALL (MISCELLANEOUS) ×2 IMPLANT
SPONGE GAUZE 2X2 STER 10/PKG (GAUZE/BANDAGES/DRESSINGS) ×1
SPONGE LAP 18X18 X RAY DECT (DISPOSABLE) IMPLANT
STAPLER VISISTAT 35W (STAPLE) ×2 IMPLANT
STRIP CLOSURE SKIN 1/2X4 (GAUZE/BANDAGES/DRESSINGS) ×2 IMPLANT
SUCTION POOLE TIP (SUCTIONS) ×2 IMPLANT
SUT ETHILON 3 0 FSL (SUTURE) ×2 IMPLANT
SUT MNCRL AB 4-0 PS2 18 (SUTURE) ×4 IMPLANT
SUT PDS AB 1 TP1 96 (SUTURE) ×4 IMPLANT
SUT SILK 2 0 SH CR/8 (SUTURE) ×2 IMPLANT
SUT SILK 2 0 TIES 10X30 (SUTURE) ×2 IMPLANT
SUT SILK 3 0 SH CR/8 (SUTURE) ×2 IMPLANT
SUT SILK 3 0 TIES 10X30 (SUTURE) ×2 IMPLANT
TOWEL OR 17X24 6PK STRL BLUE (TOWEL DISPOSABLE) ×2 IMPLANT
TOWEL OR 17X26 10 PK STRL BLUE (TOWEL DISPOSABLE) ×2 IMPLANT
TRAY FOLEY CATH SILVER 16FR (SET/KITS/TRAYS/PACK) ×2 IMPLANT
TRAY FOLEY W/METER SILVER 16FR (SET/KITS/TRAYS/PACK) ×2 IMPLANT
TRAY LAPAROSCOPIC MC (CUSTOM PROCEDURE TRAY) ×2 IMPLANT
TROCAR XCEL NON-BLD 11X100MML (ENDOMECHANICALS) ×2 IMPLANT
TROCAR XCEL NON-BLD 5MMX100MML (ENDOMECHANICALS) ×2 IMPLANT
TUBING INSUFFLATION (TUBING) ×2 IMPLANT
YANKAUER SUCT BULB TIP NO VENT (SUCTIONS) IMPLANT

## 2017-07-16 NOTE — H&P (Signed)
[facility] Admission Note  [name] [name] 04-Feb-1991  [id number].    Requesting MD: Leonette Monarch, MD Chief Complaint/Reason for Consult: intraabdominal air free HPI:  [name] [name] is a 26 year old African-American male with a past medical history of schizophrenia who presented to [facility] emergency department by 3-4 days of abdominal pain. Patient states that on Tuesday after he ate some salmon he developed acute, sharp lower abdominal pain followed by vomiting. He reports worsening abdominal pain since this time associated with intermittent, low volume emesis. Denies hematemesis. Other associated symptoms included subjective fever and chills. Patient denies similar pain in the past. Attempted to take ibuprofen with no relief of symptoms. Denies any alleviating factors. He's also been having diarrhea. He reports a recent history of hearing voices so he sought help from psychiatry and was started on medications. He reports these medications helped however he stopped taking them. He reports no known drug allergies. He denies tobacco alcohol or illicit drug use. He currently lives in [place] with his father and he is unemployed. The patient denies a history of abdominal surgery.    ROS: Review of Systems  Constitutional: Positive for chills. Negative for fever.  Gastrointestinal: Positive for abdominal pain, diarrhea, nausea and vomiting. Negative for blood in stool, constipation, heartburn and melena.  Genitourinary: Negative.   All other systems reviewed and are negative.   Family History  Problem Relation Age of Onset  . Other Mother   . Other Brother     Past Medical History:  Diagnosis Date  . Mental disorder   . Schizophrenia [facility])     Past Surgical History:  Procedure Laterality Date  . NO PAST SURGERIES      Social History:  reports that he quit smoking about 9 months ago. His smoking use included Cigarettes. He has never used smokeless tobacco. He reports  that he drinks alcohol. He reports that he uses drugs, including Marijuana.  Allergies: No Known Allergies   (Not in a hospital admission)  Blood pressure 124/83, pulse (!) 108, temperature 98.4 F (36.9 C), temperature source Oral, resp. rate (!) 28, height '5\' 4"'$  (1.626 m), weight 59 kg (130 lb), SpO2 100 %. Physical Exam: Physical Exam  Constitutional: He is oriented to person, place, and time. He appears well-developed and well-nourished. No distress.  HENT:  Head: Normocephalic and atraumatic.  Right Ear: External ear normal.  Left Ear: External ear normal.  Mouth/Throat: Oropharynx is clear and moist.  Eyes: Pupils are equal, round, and reactive to light. EOM are normal. Right eye exhibits no discharge. Left eye exhibits no discharge. No scleral icterus.  Neck: Normal range of motion. No tracheal deviation present.  Cardiovascular: Regular rhythm, normal heart sounds and intact distal pulses.  Exam reveals no friction rub.   No murmur heard. Sinus tachycardia 117-120 bpm during my exam, normotensive   Pulmonary/Chest: Effort normal and breath sounds normal. No stridor. No respiratory distress. He has no wheezes. He has no rales.  Abdominal: Soft. Bowel sounds are normal. He exhibits no distension and no mass. There is tenderness. There is rebound and guarding. No hernia.  TTP RUQ, RLQ, LLQ with peritonitis. No hernias or masses.   Musculoskeletal: Normal range of motion. He exhibits no edema, tenderness or deformity.  Neurological: He is alert and oriented to person, place, and time. No sensory deficit.  Skin: Skin is warm and dry. No rash noted. He is not diaphoretic.  Psychiatric: His behavior is normal.  Flat affect.    Results for  orders placed or performed during the hospital encounter of 07/16/17 (from the past 48 hour(s))  Lipase, blood     Status: None   Collection Time: 07/16/17 12:08 PM  Result Value Ref Range   Lipase 14 11 - 51 U/L  Comprehensive metabolic panel      Status: Abnormal   Collection Time: 07/16/17 12:08 PM  Result Value Ref Range   Sodium 130 (L) 135 - 145 mmol/L   Potassium 3.7 3.5 - 5.1 mmol/L   Chloride 94 (L) 101 - 111 mmol/L   CO2 24 22 - 32 mmol/L   Glucose, Bld 139 (H) 65 - 99 mg/dL   BUN 17 6 - 20 mg/dL   Creatinine, Ser 0.86 0.61 - 1.24 mg/dL   Calcium 9.2 8.9 - 10.3 mg/dL   Total Protein 7.7 6.5 - 8.1 g/dL   Albumin 4.0 3.5 - 5.0 g/dL   AST 26 15 - 41 U/L   ALT 21 17 - 63 U/L   Alkaline Phosphatase 100 38 - 126 U/L   Total Bilirubin 1.6 (H) 0.3 - 1.2 mg/dL   GFR calc non Af Amer >60 >60 mL/min   GFR calc Af Amer >60 >60 mL/min    Comment: (NOTE) The eGFR has been calculated using the CKD EPI equation. This calculation has not been validated in all clinical situations. eGFR's persistently <60 mL/min signify possible Chronic Kidney Disease.    Anion gap 12 5 - 15  CBC     Status: Abnormal   Collection Time: 07/16/17 12:08 PM  Result Value Ref Range   WBC 14.6 (H) 4.0 - 10.5 K/uL   RBC 4.94 4.22 - 5.81 MIL/uL   Hemoglobin 15.2 13.0 - 17.0 g/dL   HCT 42.3 39.0 - 52.0 %   MCV 85.6 78.0 - 100.0 fL   MCH 30.8 26.0 - 34.0 pg   MCHC 35.9 30.0 - 36.0 g/dL   RDW 12.3 11.5 - 15.5 %   Platelets 300 150 - 400 K/uL  I-Stat CG4 Lactic Acid, ED  (not at  [facility])     Status: None   Collection Time: 07/16/17  2:02 PM  Result Value Ref Range   Lactic Acid, Venous 1.69 0.5 - 1.9 mmol/L   Ct Abdomen Pelvis W Contrast  Result Date: 07/16/2017 CLINICAL DATA:  Diffuse abdominal pain with nausea, vomiting common diarrhea EXAM: CT ABDOMEN AND PELVIS WITH CONTRAST TECHNIQUE: Multidetector CT imaging of the abdomen and pelvis was performed using the standard protocol following bolus administration of intravenous contrast. CONTRAST:  75 mL ISOVUE-300 IOPAMIDOL (ISOVUE-300) INJECTION 61% COMPARISON:  None. FINDINGS: Lower chest: Lung bases are clear. Hepatobiliary: No focal liver lesions are evident. There is fatty infiltration near the  fissure for the ligamentum teres. Gallbladder wall is not appreciably thickened. There is no biliary duct dilatation. Pancreas: There is no pancreatic mass or inflammatory focus. Spleen: No splenic lesions are evident. Adrenals/Urinary Tract: Adrenals appear normal bilaterally. There is a cyst in the periphery of the mid right kidney measuring 9 x 9 mm. There is a 2.8 x 2.8 cm cyst in the mid left kidney. There is no hydronephrosis on either side. There is no renal or ureteral calculus. Urinary bladder wall is diffusely thickened. Stomach/Bowel: There is diffuse bowel wall thickening throughout multiple loops of small and large bowel with adjacent ascites. There is a slight amount of pneumoperitoneum seen at the level of the liver anteriorly. No focus of bowel obstruction is evident. No transition zone is appreciated. Vascular/Lymphatic:  There is no abdominal aortic aneurysm. No vascular lesions are evident. There is no adenopathy appreciable in the abdomen or pelvis. Reproductive: Prostate and seminal vesicles appear normal in size and contour. No pelvic mass evident. Other: There is an apparent appendicolith measuring 1.0 x 0.5 cm. There is irregular air in the right lower quadrant with apparent wall thickening. This finding is felt to represent appendiceal inflammation. There is no well-defined abscess. There is moderate ascites throughout the abdomen. Musculoskeletal: No blastic or lytic bone lesions. No intramuscular lesion or abdominal wall lesion. IMPRESSION: 1. Suspect appendicitis with an irregular focus of air in the right lower quadrant with appendiceal wall thickening, raising concern for potential rupture of the appendix. Appendicolith noted within the appendix. A small amount of free air is evident. There is moderate ascites with diffuse bowel wall thickening, likely due to under colitis secondary to peritonitis. Urinary bladder wall thickening is likely secondary to inflammation from peritonitis as  well. 2.  No renal or ureteral calculus.  No hydronephrosis. 3.  No appreciable bowel obstruction. Critical Value/emergent results were called by telephone at the time of interpretation on 07/16/2017 at 3:07 pm to [place], PA, who verbally acknowledged these results. Electronically Signed   By: Lowella Grip III M.D.   On: 07/16/2017 15:08   Assessment/Plan Abdominal pain and peritonitis Intestinal perforation/free air  - based on patient history and physical exam suspect acute, perforated appendicitis   - CT Abd/pelvis w/ irregular focus of air in the right lower quadrant, appendicolith in the appendix, moderate  ascites, and diffuse bowel wall thickening.   - NPO, IVF  - IV abx (Zosyn)  - OR today for diagnostic laparoscopy, probable laparoscopic appendectomy, possible laparotomy.   Sinus tachycardia - IVF resuscitation  Nausea/ vomiting - PRN antiemetics History of schizophrenia    Jill Alexanders, [facility] 07/16/2017, 3:51 PM Pager: [phone] Consults: [phone] Mon-Fri 7:00 am-4:30 pm Sat-Sun 7:00 am-11:30 am

## 2017-07-16 NOTE — ED Provider Notes (Signed)
Medical screening examination/treatment/procedure(s) were conducted as a shared visit with non-physician practitioner(s) and myself.  I personally evaluated the patient during the encounter. Briefly, the patient is a 26 y.o. male who presents to the emergency department with several days of nausea, vomiting, diarrhea and worsening lower abdominal pain in the setting of suspicious food intake 3 days ago.  Patient is afebrile, normotensive but tachycardic.  Abdomen with significant lower abdominal tenderness bilaterally.  Workup revealed perforated appendicitis. Given empiric Abxs. Admitted to EGS for management.   EKG Interpretation  Date/Time:  Friday July 16 2017 13:21:04 EDT Ventricular Rate:  118 PR Interval:    QRS Duration: 83 QT Interval:  281 QTC Calculation: 394 R Axis:   69 Text Interpretation:  Sinus tachycardia Right atrial enlargement Nonspecific T abnormalities, diffuse leads NO STEMI Confirmed by Drema Pryardama, Mallissa Lorenzen [phone]) on 07/16/2017 2:07:32 PM           Nira Connardama, Zayn Selley Eduardo, MD 07/16/17 1553

## 2017-07-16 NOTE — Transfer of Care (Signed)
Immediate Anesthesia Transfer of Care Note  Patient: [name] [name]  Procedure(s) Performed: APPENDECTOMY LAPAROSCOPIC (N/A Abdomen) POSSIBLE OPEN  LAPAROTOMY (N/A Abdomen)  Patient Location: PACU  Anesthesia Type:General  Level of Consciousness: drowsy and patient cooperative  Airway & Oxygen Therapy: Patient Spontanous Breathing  Post-op Assessment: Report given to RN, Post -op Vital signs reviewed and stable and Patient moving all extremities X 4  Post vital signs: Reviewed and stable  Last Vitals:  Vitals:   07/16/17 1615 07/16/17 1816  BP: 122/80 131/80  Pulse: (!) 121 (!) 111  Resp: (!) 21 20  Temp:  36.9 C  SpO2: 100% 100%    Last Pain:  Vitals:   07/16/17 1410  TempSrc:   PainSc: 5          Complications: No apparent anesthesia complications

## 2017-07-16 NOTE — Anesthesia Preprocedure Evaluation (Addendum)
Anesthesia Evaluation  Patient identified by MRN, date of birth, ID band Patient awake    Reviewed: Allergy & Precautions, NPO status , Patient's Chart, lab work & pertinent test results  Airway Mallampati: II  TM Distance: >3 FB     Dental   Pulmonary former smoker,    breath sounds clear to auscultation       Cardiovascular negative cardio ROS   Rhythm:Regular Rate:Normal     Neuro/Psych    GI/Hepatic Neg liver ROS, GI history noted. CG   Endo/Other  negative endocrine ROS  Renal/GU negative Renal ROS     Musculoskeletal   Abdominal   Peds  Hematology   Anesthesia Other Findings   Reproductive/Obstetrics                             Anesthesia Physical Anesthesia Plan  ASA: II and emergent  Anesthesia Plan: General   Post-op Pain Management:    Induction: Intravenous, Rapid sequence and Cricoid pressure planned  PONV Risk Score and Plan: 2 and Ondansetron, Dexamethasone, Midazolam and Treatment may vary due to age or medical condition  Airway Management Planned: Oral ETT  Additional Equipment:   Intra-op Plan:   Post-operative Plan: Extubation in OR  Informed Consent: I have reviewed the patients History and Physical, chart, labs and discussed the procedure including the risks, benefits and alternatives for the proposed anesthesia with the patient or authorized representative who has indicated his/her understanding and acceptance.   Dental advisory given  Plan Discussed with: CRNA and Anesthesiologist  Anesthesia Plan Comments:        Anesthesia Quick Evaluation

## 2017-07-16 NOTE — Anesthesia Procedure Notes (Signed)
Procedure Name: Intubation Date/Time: 07/16/2017 5:06 PM Performed by: Julieta Bellini Pre-anesthesia Checklist: Patient identified, Emergency Drugs available, Suction available and Patient being monitored Patient Re-evaluated:Patient Re-evaluated prior to induction Oxygen Delivery Method: Circle system utilized Preoxygenation: Pre-oxygenation with 100% oxygen Induction Type: IV induction, Cricoid Pressure applied and Rapid sequence Laryngoscope Size: Mac and 4 Grade View: Grade I Tube type: Oral Tube size: 7.0 mm Number of attempts: 1 Airway Equipment and Method: Stylet Placement Confirmation: ETT inserted through vocal cords under direct vision,  positive ETCO2 and breath sounds checked- equal and bilateral Secured at: 21 cm Tube secured with: Tape Dental Injury: Teeth and Oropharynx as per pre-operative assessment

## 2017-07-16 NOTE — ED Provider Notes (Signed)
[facility] EMERGENCY DEPARTMENT Provider Note   CSN: [id number] Arrival date & time: 07/16/17  1200   History   Chief Complaint Chief Complaint  Patient presents with  . Abdominal Pain    HPI Kweli Grassel is a 26 y.o. male.  HPI   26 year old male presents today with complaints of abdominal pain. Pt reports that two days ago after eating salmon he developed bilateral lower abdominal pain sharp in nature. H reports associated vomiting and chills. Also notes dysuria. Pt notes very little PO intake over the last several days. He denies any drug or alcohol use. No medication prior to arrival. No previous abdominal surgeries.    Past Medical History:  Diagnosis Date  . Mental disorder   . Schizophrenia [facility])     Patient Active Problem List   Diagnosis Date Noted  . 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 NO PAST SURGERIES        Home Medications    Prior to Admission medications   Medication Sig Start Date End Date Taking? Authorizing Provider  benztropine (COGENTIN) 0.5 MG tablet Take 1 tablet (0.5 mg total) by mouth daily. 02/28/17   Vanetta Mulders, MD  clomiPHENE (CLOMID) 50 MG tablet Take 1 tablet (50 mg total) by mouth daily. Patient not taking: Reported on 02/26/2017 04/08/15   Romero Belling, MD  risperiDONE (RISPERDAL) 0.5 MG tablet Take 1 tablet (0.5 mg total) by mouth 2 (two) times daily. 02/28/17   Vanetta Mulders, MD    Family History Family History  Problem Relation Age of Onset  . Other Mother   . Other Brother     Social History Social History  Substance Use Topics  . Smoking status: Former Smoker    Types: Cigarettes    Quit date: 09/21/2016  . Smokeless tobacco: Never Used  . Alcohol use Yes     Allergies   Patient has no known allergies.   Review of Systems Review of Systems  All other systems  reviewed and are negative.  Physical Exam Updated Vital Signs BP 124/83   Pulse (!) 108   Temp 98.4 F (36.9 C) (Oral)   Resp (!) 28   Ht 5\' 4"  (1.626 m)   Wt 59 kg (130 lb)   SpO2 100%   BMI 22.31 kg/m   Physical Exam  Constitutional: He is oriented to person, place, and time. He appears well-developed and well-nourished.  HENT:  Head: Normocephalic and atraumatic.  Eyes: Pupils are equal, round, and reactive to light. Conjunctivae are normal. Right eye exhibits no discharge. Left eye exhibits no discharge. No scleral icterus.  Neck: Normal range of motion. No JVD present. No tracheal deviation present.  Pulmonary/Chest: Effort normal. No stridor.  Abdominal: Soft. He exhibits no distension and no mass. There is tenderness. There is guarding. There is no rebound. No hernia.  Neurological: He is alert and oriented to person, place, and time. Coordination normal.  Skin: Skin is warm.  Psychiatric: He has a normal mood and affect. His behavior is normal. Judgment and thought content normal.  Nursing note and vitals reviewed.    ED Treatments / Results  Labs (all labs ordered are listed, but only abnormal results are displayed) Labs Reviewed  COMPREHENSIVE METABOLIC PANEL - Abnormal; Notable for the following:       Result Value   Sodium 130 (*)    Chloride 94 (*)  Glucose, Bld 139 (*)    Total Bilirubin 1.6 (*)    All other components within normal limits  CBC - Abnormal; Notable for the following:    WBC 14.6 (*)    All other components within normal limits  CULTURE, BLOOD (ROUTINE X 2)  CULTURE, BLOOD (ROUTINE X 2)  LIPASE, BLOOD  URINALYSIS, ROUTINE W REFLEX MICROSCOPIC  RAPID URINE DRUG SCREEN, HOSP PERFORMED  I-STAT CG4 LACTIC ACID, ED  I-STAT CG4 LACTIC ACID, ED    EKG  EKG Interpretation  Date/Time:  Friday July 16 2017 13:21:04 EDT Ventricular Rate:  118 PR Interval:    QRS Duration: 83 QT Interval:  281 QTC Calculation: 394 R Axis:   69 Text  Interpretation:  Sinus tachycardia Right atrial enlargement Nonspecific T abnormalities, diffuse leads NO STEMI Confirmed by Drema Pry [phone]) on 07/16/2017 2:07:32 PM       Radiology Ct Abdomen Pelvis W Contrast  Result Date: 07/16/2017 CLINICAL DATA:  Diffuse abdominal pain with nausea, vomiting common diarrhea EXAM: CT ABDOMEN AND PELVIS WITH CONTRAST TECHNIQUE: Multidetector CT imaging of the abdomen and pelvis was performed using the standard protocol following bolus administration of intravenous contrast. CONTRAST:  75 mL ISOVUE-300 IOPAMIDOL (ISOVUE-300) INJECTION 61% COMPARISON:  None. FINDINGS: Lower chest: Lung bases are clear. Hepatobiliary: No focal liver lesions are evident. There is fatty infiltration near the fissure for the ligamentum teres. Gallbladder wall is not appreciably thickened. There is no biliary duct dilatation. Pancreas: There is no pancreatic mass or inflammatory focus. Spleen: No splenic lesions are evident. Adrenals/Urinary Tract: Adrenals appear normal bilaterally. There is a cyst in the periphery of the mid right kidney measuring 9 x 9 mm. There is a 2.8 x 2.8 cm cyst in the mid left kidney. There is no hydronephrosis on either side. There is no renal or ureteral calculus. Urinary bladder wall is diffusely thickened. Stomach/Bowel: There is diffuse bowel wall thickening throughout multiple loops of small and large bowel with adjacent ascites. There is a slight amount of pneumoperitoneum seen at the level of the liver anteriorly. No focus of bowel obstruction is evident. No transition zone is appreciated. Vascular/Lymphatic: There is no abdominal aortic aneurysm. No vascular lesions are evident. There is no adenopathy appreciable in the abdomen or pelvis. Reproductive: Prostate and seminal vesicles appear normal in size and contour. No pelvic mass evident. Other: There is an apparent appendicolith measuring 1.0 x 0.5 cm. There is irregular air in the right lower  quadrant with apparent wall thickening. This finding is felt to represent appendiceal inflammation. There is no well-defined abscess. There is moderate ascites throughout the abdomen. Musculoskeletal: No blastic or lytic bone lesions. No intramuscular lesion or abdominal wall lesion. IMPRESSION: 1. Suspect appendicitis with an irregular focus of air in the right lower quadrant with appendiceal wall thickening, raising concern for potential rupture of the appendix. Appendicolith noted within the appendix. A small amount of free air is evident. There is moderate ascites with diffuse bowel wall thickening, likely due to under colitis secondary to peritonitis. Urinary bladder wall thickening is likely secondary to inflammation from peritonitis as well. 2.  No renal or ureteral calculus.  No hydronephrosis. 3.  No appreciable bowel obstruction. Critical Value/emergent results were called by telephone at the time of interpretation on 07/16/2017 at 3:07 pm to Khai Arrona, PA, who verbally acknowledged these results. Electronically Signed   By: Bretta Bang III M.D.   On: 07/16/2017 15:08    Procedures Procedures (including critical care time)  Medications Ordered in ED Medications  piperacillin-tazobactam (ZOSYN) IVPB 3.375 g (not administered)  0.9 %  sodium chloride infusion (not administered)  sodium chloride 0.9 % bolus 1,000 mL (0 mLs Intravenous Stopped 07/16/17 1403)    And  sodium chloride 0.9 % bolus 1,000 mL (0 mLs Intravenous Stopped 07/16/17 1451)  ondansetron (ZOFRAN) injection 4 mg (4 mg Intravenous Given 07/16/17 1349)  morphine 4 MG/ML injection 4 mg (4 mg Intravenous Given 07/16/17 1349)  piperacillin-tazobactam (ZOSYN) IVPB 3.375 g (0 g Intravenous Stopped 07/16/17 1556)  iopamidol (ISOVUE-300) 61 % injection (75 mLs Intravenous Contrast Given 07/16/17 1432)     Initial Impression / Assessment and Plan / ED Course  I have reviewed the triage vital signs and the nursing  notes.  Pertinent labs & imaging results that were available during my care of the patient were reviewed by me and considered in my medical decision making (see chart for details).     Final Clinical Impressions(s) / ED Diagnoses   Final diagnoses:  Ruptured appendicitis   Labs: cbc, cmp, lipase, UA  Imaging: CT ABD/PELVIS W  Consults: General surgery  Therapeutics: morphine Zosyn  Discharge Meds:   Assessment/Plan: 26 year old male presents today with likely ruptured appendicitis.  Patient was immediately started on sepsis protocols including weight-based fluid resuscitation, Zosyn, with CT scan.  CT scan shows likely ruptured appendicitis.  General surgery consulted who would take patient to the OR.  Patient has remained stable while here in the ED.      New Prescriptions New Prescriptions   No medications on file     Eyvonne MechanicHedges, Tersa Fotopoulos, Cordelia Poche-C 07/16/17 1557

## 2017-07-16 NOTE — Op Note (Signed)
07/16/2017  6:11 PM  PATIENT:  [name] [name]  26 y.o. male  PRE-OPERATIVE DIAGNOSIS:  ruptured appendicitis  POST-OPERATIVE DIAGNOSIS:  Ruptured, purulent  Appendicitis with generalized peritonitis  PROCEDURE:  Procedure(s): APPENDECTOMY LAPAROSCOPIC (N/A) POSSIBLE OPEN  LAPAROTOMY (N/A)  SURGEON:  Surgeon(s) and Role:    Axel Filler, MD - Primary  ANESTHESIA:   local and general  EBL:  15 mL   BLOOD ADMINISTERED:none  DRAINS: (19Fr) Jackson-Pratt drain(s) with closed bulb suction in the RLQ and pelvis   LOCAL MEDICATIONS USED:  BUPIVICAINE   SPECIMEN:  Source of Specimen:  appendix  DISPOSITION OF SPECIMEN:  PATHOLOGY  COUNTS:  YES  TOURNIQUET:  * No tourniquets in log *  DICTATION: .Dragon Dictation Complications: none  Counts: reported as correct x 2  Findings:  The patient had a acutely inflammed, perforated, purulent appendix with inflamed small bowel and generalized peritonitis.  Specimen: Appendix  Indications for procedure:  The patient is a 26 year old male with a history of periumbilical pain localized in the right lower quadrant patient had a CT scan which revealed signs consistent with acute likely perforated appendicitis the patient back in for laparoscopic appendectomy.  Details of the procedure:The patient was taken back to the operating room. The patient was placed in supine position with bilateral SCDs in place.  A foley catheter was place. The patient was prepped and draped in the usual sterile fashion.  After appropriate anitbiotics were confirmed, a time-out was confirmed and all facts were verified.    A pneumoperitoneum of 14 mmHg was obtained via a Veress needle technique in the left lower quadrant quadrant.  A 5 mm trocar and 5 mm camera then placed intra-abdominally there is no injury to any intra-abdominal organs a 10 mm infraumbilical port was placed and direct visualization as was a 5 mm port in the suprapubic area.  The abdominal  cavity a large amount of fibrinous exudate.  There was free purulence within the abdominal cavity.  The peritoneum appeared to have acute peritonitis.  The patient was positioned.  The small bowel small bowel adhesions were looking up with suction and blunt dissection.  The right colon and cecum were visualized.  The tinea coli were then traced down to the base of the appendix.  It could easily be seen that the appendix was perforated at the tip.  At this time I decided to transect the base of the appendix.  The mucosa was cauterized.  2 Endoloops were placed proximally.  At this time cautery was used to maintain hemostasis and blunt and sharp dissection as well as cautery was used to dissect off the mesoappendix from the posterior portion of the cecum.  This freed up the appendix entirely.   A retrieval bag was then placed into the abdomen and the specimen placed in the bag.  At this time I proceeded to irrigate the entire abdomen, pelvis, right perihepatic and pericolic gutter as well as the left perihepatic and left colic gutter.  At this time a 66 French Jackson-Pratt drain was then placed to the abdomen.  This was positioned into the pelvis and the right lower quadrant.  This is brought up to the left lower quadrant 5 mm port site.  This was anchored to the skin using a 3-0 nylon x1.    The appendix  and retrieval  bag was then retrieved via the supraumbilical port. #1 Vicryl was used to reapproximate the fascia at the umbilical port site x1. The skin was reapproximated  all port sites 3-0 Monocryl subcuticular fashion. The skin was dressed with steri-strips, guaze, and tape.  The patient had the foley removed. The patient was awakened from general anesthesia was taken to recovery room in stable condition.       PLAN OF CARE: Admit to inpatient   PATIENT DISPOSITION:  PACU - hemodynamically stable.   Delay start of Pharmacological VTE agent (>24hrs) due to surgical blood loss or risk of  bleeding: no

## 2017-07-16 NOTE — ED Notes (Signed)
Pt attempting to provide urine sample at this time

## 2017-07-16 NOTE — ED Triage Notes (Signed)
Pt c/o LLQ abd onset x 3 days ago, with x 3 loose stools and x 1 emesis in the last 24 hrs, pt seems lethargic, tachycardic in triage, pt A&O x4

## 2017-07-17 ENCOUNTER — Encounter (HOSPITAL_COMMUNITY): Payer: Self-pay | Admitting: General Surgery

## 2017-07-17 LAB — BASIC METABOLIC PANEL
Anion gap: 7 (ref 5–15)
BUN: 7 mg/dL (ref 6–20)
CO2: 25 mmol/L (ref 22–32)
Calcium: 8.6 mg/dL — ABNORMAL LOW (ref 8.9–10.3)
Chloride: 102 mmol/L (ref 101–111)
Creatinine, Ser: 0.44 mg/dL — ABNORMAL LOW (ref 0.61–1.24)
GFR calc Af Amer: >60 mL/min (ref >60)
GFR calc non Af Amer: >60 mL/min (ref >60)
Glucose, Bld: 134 mg/dL — ABNORMAL HIGH (ref 65–99)
Potassium: 4.1 mmol/L (ref 3.5–5.1)
Sodium: 134 mmol/L — ABNORMAL LOW (ref 135–145)

## 2017-07-17 LAB — CBC
HCT: 31.2 % — ABNORMAL LOW (ref 39.0–52.0)
Hemoglobin: 11 g/dL — ABNORMAL LOW (ref 13.0–17.0)
MCH: 30.1 pg (ref 26.0–34.0)
MCHC: 35.3 g/dL (ref 30.0–36.0)
MCV: 85.5 fL (ref 78.0–100.0)
Platelets: 226 10*3/uL (ref 150–400)
RBC: 3.65 MIL/uL — ABNORMAL LOW (ref 4.22–5.81)
RDW: 12.5 % (ref 11.5–15.5)
WBC: 13.3 10*3/uL — ABNORMAL HIGH (ref 4.0–10.5)

## 2017-07-17 MED ORDER — ALUM & MAG HYDROXIDE-SIMETH 200-200-20 MG/5ML PO SUSP
30.0000 mL | ORAL | Status: DC | PRN
  Administered 2017-07-17: 30 mL via TOPICAL
  Filled 2017-07-17: qty 30

## 2017-07-17 MED ORDER — INFLUENZA VAC SPLIT QUAD 0.5 ML IM SUSY
0.5000 mL | PREFILLED_SYRINGE | INTRAMUSCULAR | Status: AC
  Administered 2017-07-21: 0.5 mL via INTRAMUSCULAR
  Filled 2017-07-17 (×3): qty 0.5

## 2017-07-17 NOTE — Progress Notes (Signed)
Patient ID: [name] [name], male   DOB: [birth year], 26 y.o.   MRN: [id number] [facility] Surgery Progress Note:   1 Day Post-Op  Subjective: Mental status is oriented.  Responds to questions appropriately.   Objective: Vital signs in last 24 hours: Temp:  [97.6 F (36.4 C)-98.4 F (36.9 C)] 98 F (36.7 C) (10/27 0530) Pulse Rate:  [74-136] 78 (10/27 0530) Resp:  [14-30] 18 (10/27 0530) BP: (103-131)/(62-87) 103/63 (10/27 0530) SpO2:  [97 %-100 %] 100 % (10/27 0530) Weight:  [59 kg (130 lb)-63 kg (138 lb 14.2 oz)] 63 kg (138 lb 14.2 oz) (10/26 1956)  Intake/Output from previous day: 10/26 0701 - 10/27 0700 In: 2035.4 [P.O.:150; I.V.:1785.4; IV Piggyback:100] Out: 4098 [Urine:1450; Drains:170; Blood:15] Intake/Output this shift: Total I/O In: -  Out: 330 [Urine:300; Drains:30]  Physical Exam: Work of breathing is normal.  JP with cloudly seroyellow fluid.  Abdomen flat  Lab Results:  Results for orders placed or performed during the hospital encounter of 07/16/17 (from the past 48 hour(s))  Lipase, blood     Status: None   Collection Time: 07/16/17 12:08 PM  Result Value Ref Range   Lipase 14 11 - 51 U/L  Comprehensive metabolic panel     Status: Abnormal   Collection Time: 07/16/17 12:08 PM  Result Value Ref Range   Sodium 130 (L) 135 - 145 mmol/L   Potassium 3.7 3.5 - 5.1 mmol/L   Chloride 94 (L) 101 - 111 mmol/L   CO2 24 22 - 32 mmol/L   Glucose, Bld 139 (H) 65 - 99 mg/dL   BUN 17 6 - 20 mg/dL   Creatinine, Ser 0.86 0.61 - 1.24 mg/dL   Calcium 9.2 8.9 - 10.3 mg/dL   Total Protein 7.7 6.5 - 8.1 g/dL   Albumin 4.0 3.5 - 5.0 g/dL   AST 26 15 - 41 U/L   ALT 21 17 - 63 U/L   Alkaline Phosphatase 100 38 - 126 U/L   Total Bilirubin 1.6 (H) 0.3 - 1.2 mg/dL   GFR calc non Af Amer >60 >60 mL/min   GFR calc Af Amer >60 >60 mL/min    Comment: (NOTE) The eGFR has been calculated using the CKD EPI equation. This calculation has not been validated in all clinical  situations. eGFR's persistently <60 mL/min signify possible Chronic Kidney Disease.    Anion gap 12 5 - 15  CBC     Status: Abnormal   Collection Time: 07/16/17 12:08 PM  Result Value Ref Range   WBC 14.6 (H) 4.0 - 10.5 K/uL   RBC 4.94 4.22 - 5.81 MIL/uL   Hemoglobin 15.2 13.0 - 17.0 g/dL   HCT 42.3 39.0 - 52.0 %   MCV 85.6 78.0 - 100.0 fL   MCH 30.8 26.0 - 34.0 pg   MCHC 35.9 30.0 - 36.0 g/dL   RDW 12.3 11.5 - 15.5 %   Platelets 300 150 - 400 K/uL  Blood Culture (routine x 2)     Status: None (Preliminary result)   Collection Time: 07/16/17  1:25 PM  Result Value Ref Range   Specimen Description BLOOD LEFT ARM    Special Requests      BOTTLES DRAWN AEROBIC AND ANAEROBIC Blood Culture adequate volume   Culture NO GROWTH < 24 HOURS    Report Status PENDING   Blood Culture (routine x 2)     Status: None (Preliminary result)   Collection Time: 07/16/17  1:34 PM  Result Value Ref Range  Specimen Description BLOOD RIGHT FOREARM    Special Requests      BOTTLES DRAWN AEROBIC AND ANAEROBIC Blood Culture adequate volume   Culture NO GROWTH < 24 HOURS    Report Status PENDING   I-Stat CG4 Lactic Acid, ED  (not at  [facility])     Status: None   Collection Time: 07/16/17  2:02 PM  Result Value Ref Range   Lactic Acid, Venous 1.69 0.5 - 1.9 mmol/L  Urinalysis, Routine w reflex microscopic     Status: Abnormal   Collection Time: 07/16/17 10:32 PM  Result Value Ref Range   Color, Urine STRAW (A) YELLOW   APPearance CLEAR CLEAR   Specific Gravity, Urine 1.008 1.005 - 1.030   pH 6.0 5.0 - 8.0   Glucose, UA NEGATIVE NEGATIVE mg/dL   Hgb urine dipstick SMALL (A) NEGATIVE   Bilirubin Urine NEGATIVE NEGATIVE   Ketones, ur NEGATIVE NEGATIVE mg/dL   Protein, ur NEGATIVE NEGATIVE mg/dL   Nitrite NEGATIVE NEGATIVE   Leukocytes, UA NEGATIVE NEGATIVE   RBC / HPF 0-5 0 - 5 RBC/hpf   WBC, UA 0-5 0 - 5 WBC/hpf   Bacteria, UA NONE SEEN NONE SEEN   Squamous Epithelial / LPF 0-5 (A) NONE SEEN   Rapid urine drug screen (hospital performed)     Status: Abnormal   Collection Time: 07/16/17 10:33 PM  Result Value Ref Range   Opiates POSITIVE (A) NONE DETECTED   Cocaine NONE DETECTED NONE DETECTED   Benzodiazepines POSITIVE (A) NONE DETECTED   Amphetamines NONE DETECTED NONE DETECTED   Tetrahydrocannabinol NONE DETECTED NONE DETECTED   Barbiturates NONE DETECTED NONE DETECTED    Comment:        DRUG SCREEN FOR MEDICAL PURPOSES ONLY.  IF CONFIRMATION IS NEEDED FOR ANY PURPOSE, NOTIFY LAB WITHIN 5 DAYS.        LOWEST DETECTABLE LIMITS FOR URINE DRUG SCREEN Drug Class       Cutoff (ng/mL) Amphetamine      1000 Barbiturate      200 Benzodiazepine   419 Tricyclics       622 Opiates          300 Cocaine          300 THC              50   Basic metabolic panel     Status: Abnormal   Collection Time: 07/17/17  5:59 AM  Result Value Ref Range   Sodium 134 (L) 135 - 145 mmol/L   Potassium 4.1 3.5 - 5.1 mmol/L   Chloride 102 101 - 111 mmol/L   CO2 25 22 - 32 mmol/L   Glucose, Bld 134 (H) 65 - 99 mg/dL   BUN 7 6 - 20 mg/dL   Creatinine, Ser 0.44 (L) 0.61 - 1.24 mg/dL   Calcium 8.6 (L) 8.9 - 10.3 mg/dL   GFR calc non Af Amer >60 >60 mL/min   GFR calc Af Amer >60 >60 mL/min    Comment: (NOTE) The eGFR has been calculated using the CKD EPI equation. This calculation has not been validated in all clinical situations. eGFR's persistently <60 mL/min signify possible Chronic Kidney Disease.    Anion gap 7 5 - 15  CBC     Status: Abnormal   Collection Time: 07/17/17  5:59 AM  Result Value Ref Range   WBC 13.3 (H) 4.0 - 10.5 K/uL   RBC 3.65 (L) 4.22 - 5.81 MIL/uL   Hemoglobin 11.0 (L) 13.0 -  17.0 g/dL    Comment: DELTA CHECK NOTED REPEATED TO VERIFY    HCT 31.2 (L) 39.0 - 52.0 %   MCV 85.5 78.0 - 100.0 fL   MCH 30.1 26.0 - 34.0 pg   MCHC 35.3 30.0 - 36.0 g/dL   RDW 02.6 93.7 - 09.3 %   Platelets 226 150 - 400 K/uL    Radiology/Results: Ct Abdomen Pelvis W  Contrast  Result Date: 07/16/2017 CLINICAL DATA:  Diffuse abdominal pain with nausea, vomiting common diarrhea EXAM: CT ABDOMEN AND PELVIS WITH CONTRAST TECHNIQUE: Multidetector CT imaging of the abdomen and pelvis was performed using the standard protocol following bolus administration of intravenous contrast. CONTRAST:  75 mL ISOVUE-300 IOPAMIDOL (ISOVUE-300) INJECTION 61% COMPARISON:  None. FINDINGS: Lower chest: Lung bases are clear. Hepatobiliary: No focal liver lesions are evident. There is fatty infiltration near the fissure for the ligamentum teres. Gallbladder wall is not appreciably thickened. There is no biliary duct dilatation. Pancreas: There is no pancreatic mass or inflammatory focus. Spleen: No splenic lesions are evident. Adrenals/Urinary Tract: Adrenals appear normal bilaterally. There is a cyst in the periphery of the mid right kidney measuring 9 x 9 mm. There is a 2.8 x 2.8 cm cyst in the mid left kidney. There is no hydronephrosis on either side. There is no renal or ureteral calculus. Urinary bladder wall is diffusely thickened. Stomach/Bowel: There is diffuse bowel wall thickening throughout multiple loops of small and large bowel with adjacent ascites. There is a slight amount of pneumoperitoneum seen at the level of the liver anteriorly. No focus of bowel obstruction is evident. No transition zone is appreciated. Vascular/Lymphatic: There is no abdominal aortic aneurysm. No vascular lesions are evident. There is no adenopathy appreciable in the abdomen or pelvis. Reproductive: Prostate and seminal vesicles appear normal in size and contour. No pelvic mass evident. Other: There is an apparent appendicolith measuring 1.0 x 0.5 cm. There is irregular air in the right lower quadrant with apparent wall thickening. This finding is felt to represent appendiceal inflammation. There is no well-defined abscess. There is moderate ascites throughout the abdomen. Musculoskeletal: No blastic or lytic  bone lesions. No intramuscular lesion or abdominal wall lesion. IMPRESSION: 1. Suspect appendicitis with an irregular focus of air in the right lower quadrant with appendiceal wall thickening, raising concern for potential rupture of the appendix. Appendicolith noted within the appendix. A small amount of free air is evident. There is moderate ascites with diffuse bowel wall thickening, likely due to under colitis secondary to peritonitis. Urinary bladder wall thickening is likely secondary to inflammation from peritonitis as well. 2.  No renal or ureteral calculus.  No hydronephrosis. 3.  No appreciable bowel obstruction. Critical Value/emergent results were called by telephone at the time of interpretation on 07/16/2017 at 3:07 pm to JEFFREY HEDGES, PA, who verbally acknowledged these results. Electronically Signed   By: Bretta Bang III M.D.   On: 07/16/2017 15:08    Anti-infectives: Anti-infectives    Start     Dose/Rate Route Frequency Ordered Stop   07/16/17 2000  piperacillin-tazobactam (ZOSYN) IVPB 3.375 g     3.375 g 12.5 mL/hr over 240 Minutes Intravenous Every 8 hours 07/16/17 1332     07/16/17 2000  piperacillin-tazobactam (ZOSYN) IVPB 3.375 g  Status:  Discontinued     3.375 g 12.5 mL/hr over 240 Minutes Intravenous Every 8 hours 07/16/17 1950 07/16/17 2148   07/16/17 1400  piperacillin-tazobactam (ZOSYN) IVPB 3.375 g     3.375 g 100  mL/hr over 30 Minutes Intravenous  Once 07/16/17 1332 07/16/17 1556      Assessment/Plan: Problem List: Patient Active Problem List   Diagnosis Date Noted  . S/P appendectomy 07/16/2017  . 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   1 Day Post-Op    LOS: 1 day   Matt B. Hassell Done, MD, [facility] [phone] beeper [phone]  07/17/2017 9:25 AM

## 2017-07-17 NOTE — Anesthesia Postprocedure Evaluation (Signed)
Anesthesia Post Note  Patient: [name] SessionsMaurice [name]  Procedure(s) Performed: APPENDECTOMY LAPAROSCOPIC (N/A Abdomen) POSSIBLE OPEN  LAPAROTOMY (N/A Abdomen)     Patient location during evaluation: PACU Anesthesia Type: General Level of consciousness: awake and alert Pain management: pain level controlled Vital Signs Assessment: post-procedure vital signs reviewed and stable Respiratory status: spontaneous breathing, nonlabored ventilation, respiratory function stable and patient connected to nasal cannula oxygen Cardiovascular status: blood pressure returned to baseline and stable Postop Assessment: no apparent nausea or vomiting Anesthetic complications: no    Last Vitals:  Vitals:   07/16/17 1930 07/16/17 1956  BP: 112/71 103/62  Pulse: 95 75  Resp: 20 18  Temp: 36.4 C 36.5 C  SpO2: 99% 99%    Last Pain:  Vitals:   07/16/17 2156  TempSrc:   PainSc: Asleep                 Charleen Madera DAVID

## 2017-07-18 LAB — CBC
HCT: 31.1 % — ABNORMAL LOW (ref 39.0–52.0)
Hemoglobin: 10.8 g/dL — ABNORMAL LOW (ref 13.0–17.0)
MCH: 30.1 pg (ref 26.0–34.0)
MCHC: 34.7 g/dL (ref 30.0–36.0)
MCV: 86.6 fL (ref 78.0–100.0)
Platelets: 230 10*3/uL (ref 150–400)
RBC: 3.59 MIL/uL — ABNORMAL LOW (ref 4.22–5.81)
RDW: 12.5 % (ref 11.5–15.5)
WBC: 10.1 10*3/uL (ref 4.0–10.5)

## 2017-07-18 MED ORDER — PROMETHAZINE HCL 25 MG/ML IJ SOLN
12.5000 mg | Freq: Four times a day (QID) | INTRAMUSCULAR | Status: DC | PRN
  Filled 2017-07-18: qty 1

## 2017-07-18 NOTE — Progress Notes (Signed)
Patient ID: [name] [name], male   DOB: 21-Jan-1991, 26 y.o.   MRN: [id number] [facility] Progress Note:   2 Days Post-Op  Subjective: Mental status is more alert and complaining of lower abdominal pain Objective: Vital signs in last 24 hours: Temp:  [98.3 F (36.8 C)-99.7 F (37.6 C)] 99.7 F (37.6 C) (10/28 0454) Pulse Rate:  [79-92] 81 (10/28 0454) Resp:  [16-18] 16 (10/28 0454) BP: (98-108)/(56-61) 108/56 (10/28 0454) SpO2:  [99 %-100 %] 100 % (10/28 0454)  Intake/Output from previous day: 10/27 0701 - 10/28 0700 In: 3785.8 [P.O.:342; I.V.:3293.8; IV Piggyback:150] Out: 8315 [Urine:1275; Drains:120] Intake/Output this shift: Total I/O In: 120 [P.O.:120] Out: -   Physical Exam: Work of breathing is normal.  JP is yellow and faintly cloudy.    Lab Results:  Results for orders placed or performed during the hospital encounter of 07/16/17 (from the past 48 hour(s))  Lipase, blood     Status: None   Collection Time: 07/16/17 12:08 PM  Result Value Ref Range   Lipase 14 11 - 51 U/L  Comprehensive metabolic panel     Status: Abnormal   Collection Time: 07/16/17 12:08 PM  Result Value Ref Range   Sodium 130 (L) 135 - 145 mmol/L   Potassium 3.7 3.5 - 5.1 mmol/L   Chloride 94 (L) 101 - 111 mmol/L   CO2 24 22 - 32 mmol/L   Glucose, Bld 139 (H) 65 - 99 mg/dL   BUN 17 6 - 20 mg/dL   Creatinine, Ser 0.86 0.61 - 1.24 mg/dL   Calcium 9.2 8.9 - 10.3 mg/dL   Total Protein 7.7 6.5 - 8.1 g/dL   Albumin 4.0 3.5 - 5.0 g/dL   AST 26 15 - 41 U/L   ALT 21 17 - 63 U/L   Alkaline Phosphatase 100 38 - 126 U/L   Total Bilirubin 1.6 (H) 0.3 - 1.2 mg/dL   GFR calc non Af Amer >60 >60 mL/min   GFR calc Af Amer >60 >60 mL/min    Comment: (NOTE) The eGFR has been calculated using the CKD EPI equation. This calculation has not been validated in all clinical situations. eGFR's persistently <60 mL/min signify possible Chronic Kidney Disease.    Anion gap 12 5 - 15  CBC      Status: Abnormal   Collection Time: 07/16/17 12:08 PM  Result Value Ref Range   WBC 14.6 (H) 4.0 - 10.5 K/uL   RBC 4.94 4.22 - 5.81 MIL/uL   Hemoglobin 15.2 13.0 - 17.0 g/dL   HCT 42.3 39.0 - 52.0 %   MCV 85.6 78.0 - 100.0 fL   MCH 30.8 26.0 - 34.0 pg   MCHC 35.9 30.0 - 36.0 g/dL   RDW 12.3 11.5 - 15.5 %   Platelets 300 150 - 400 K/uL  Blood Culture (routine x 2)     Status: None (Preliminary result)   Collection Time: 07/16/17  1:25 PM  Result Value Ref Range   Specimen Description BLOOD LEFT ARM    Special Requests      BOTTLES DRAWN AEROBIC AND ANAEROBIC Blood Culture adequate volume   Culture NO GROWTH < 24 HOURS    Report Status PENDING   Blood Culture (routine x 2)     Status: None (Preliminary result)   Collection Time: 07/16/17  1:34 PM  Result Value Ref Range   Specimen Description BLOOD RIGHT FOREARM    Special Requests      BOTTLES DRAWN AEROBIC AND  ANAEROBIC Blood Culture adequate volume   Culture NO GROWTH < 24 HOURS    Report Status PENDING   I-Stat CG4 Lactic Acid, ED  (not at  [facility])     Status: None   Collection Time: 07/16/17  2:02 PM  Result Value Ref Range   Lactic Acid, Venous 1.69 0.5 - 1.9 mmol/L  Urinalysis, Routine w reflex microscopic     Status: Abnormal   Collection Time: 07/16/17 10:32 PM  Result Value Ref Range   Color, Urine STRAW (A) YELLOW   APPearance CLEAR CLEAR   Specific Gravity, Urine 1.008 1.005 - 1.030   pH 6.0 5.0 - 8.0   Glucose, UA NEGATIVE NEGATIVE mg/dL   Hgb urine dipstick SMALL (A) NEGATIVE   Bilirubin Urine NEGATIVE NEGATIVE   Ketones, ur NEGATIVE NEGATIVE mg/dL   Protein, ur NEGATIVE NEGATIVE mg/dL   Nitrite NEGATIVE NEGATIVE   Leukocytes, UA NEGATIVE NEGATIVE   RBC / HPF 0-5 0 - 5 RBC/hpf   WBC, UA 0-5 0 - 5 WBC/hpf   Bacteria, UA NONE SEEN NONE SEEN   Squamous Epithelial / LPF 0-5 (A) NONE SEEN  Rapid urine drug screen (hospital performed)     Status: Abnormal   Collection Time: 07/16/17 10:33 PM  Result Value Ref  Range   Opiates POSITIVE (A) NONE DETECTED   Cocaine NONE DETECTED NONE DETECTED   Benzodiazepines POSITIVE (A) NONE DETECTED   Amphetamines NONE DETECTED NONE DETECTED   Tetrahydrocannabinol NONE DETECTED NONE DETECTED   Barbiturates NONE DETECTED NONE DETECTED    Comment:        DRUG SCREEN FOR MEDICAL PURPOSES ONLY.  IF CONFIRMATION IS NEEDED FOR ANY PURPOSE, NOTIFY LAB WITHIN 5 DAYS.        LOWEST DETECTABLE LIMITS FOR URINE DRUG SCREEN Drug Class       Cutoff (ng/mL) Amphetamine      1000 Barbiturate      200 Benzodiazepine   035 Tricyclics       009 Opiates          300 Cocaine          300 THC              50   Basic metabolic panel     Status: Abnormal   Collection Time: 07/17/17  5:59 AM  Result Value Ref Range   Sodium 134 (L) 135 - 145 mmol/L   Potassium 4.1 3.5 - 5.1 mmol/L   Chloride 102 101 - 111 mmol/L   CO2 25 22 - 32 mmol/L   Glucose, Bld 134 (H) 65 - 99 mg/dL   BUN 7 6 - 20 mg/dL   Creatinine, Ser 0.44 (L) 0.61 - 1.24 mg/dL   Calcium 8.6 (L) 8.9 - 10.3 mg/dL   GFR calc non Af Amer >60 >60 mL/min   GFR calc Af Amer >60 >60 mL/min    Comment: (NOTE) The eGFR has been calculated using the CKD EPI equation. This calculation has not been validated in all clinical situations. eGFR's persistently <60 mL/min signify possible Chronic Kidney Disease.    Anion gap 7 5 - 15  CBC     Status: Abnormal   Collection Time: 07/17/17  5:59 AM  Result Value Ref Range   WBC 13.3 (H) 4.0 - 10.5 K/uL   RBC 3.65 (L) 4.22 - 5.81 MIL/uL   Hemoglobin 11.0 (L) 13.0 - 17.0 g/dL    Comment: DELTA CHECK NOTED REPEATED TO VERIFY    HCT 31.2 (L)  39.0 - 52.0 %   MCV 85.5 78.0 - 100.0 fL   MCH 30.1 26.0 - 34.0 pg   MCHC 35.3 30.0 - 36.0 g/dL   RDW 12.5 11.5 - 15.5 %   Platelets 226 150 - 400 K/uL  CBC     Status: Abnormal   Collection Time: 07/18/17  2:22 AM  Result Value Ref Range   WBC 10.1 4.0 - 10.5 K/uL   RBC 3.59 (L) 4.22 - 5.81 MIL/uL   Hemoglobin 10.8 (L) 13.0 -  17.0 g/dL   HCT 31.1 (L) 39.0 - 52.0 %   MCV 86.6 78.0 - 100.0 fL   MCH 30.1 26.0 - 34.0 pg   MCHC 34.7 30.0 - 36.0 g/dL   RDW 12.5 11.5 - 15.5 %   Platelets 230 150 - 400 K/uL    Radiology/Results: Ct Abdomen Pelvis W Contrast  Result Date: 07/16/2017 CLINICAL DATA:  Diffuse abdominal pain with nausea, vomiting common diarrhea EXAM: CT ABDOMEN AND PELVIS WITH CONTRAST TECHNIQUE: Multidetector CT imaging of the abdomen and pelvis was performed using the standard protocol following bolus administration of intravenous contrast. CONTRAST:  75 mL ISOVUE-300 IOPAMIDOL (ISOVUE-300) INJECTION 61% COMPARISON:  None. FINDINGS: Lower chest: Lung bases are clear. Hepatobiliary: No focal liver lesions are evident. There is fatty infiltration near the fissure for the ligamentum teres. Gallbladder wall is not appreciably thickened. There is no biliary duct dilatation. Pancreas: There is no pancreatic mass or inflammatory focus. Spleen: No splenic lesions are evident. Adrenals/Urinary Tract: Adrenals appear normal bilaterally. There is a cyst in the periphery of the mid right kidney measuring 9 x 9 mm. There is a 2.8 x 2.8 cm cyst in the mid left kidney. There is no hydronephrosis on either side. There is no renal or ureteral calculus. Urinary bladder wall is diffusely thickened. Stomach/Bowel: There is diffuse bowel wall thickening throughout multiple loops of small and large bowel with adjacent ascites. There is a slight amount of pneumoperitoneum seen at the level of the liver anteriorly. No focus of bowel obstruction is evident. No transition zone is appreciated. Vascular/Lymphatic: There is no abdominal aortic aneurysm. No vascular lesions are evident. There is no adenopathy appreciable in the abdomen or pelvis. Reproductive: Prostate and seminal vesicles appear normal in size and contour. No pelvic mass evident. Other: There is an apparent appendicolith measuring 1.0 x 0.5 cm. There is irregular air in the right  lower quadrant with apparent wall thickening. This finding is felt to represent appendiceal inflammation. There is no well-defined abscess. There is moderate ascites throughout the abdomen. Musculoskeletal: No blastic or lytic bone lesions. No intramuscular lesion or abdominal wall lesion. IMPRESSION: 1. Suspect appendicitis with an irregular focus of air in the right lower quadrant with appendiceal wall thickening, raising concern for potential rupture of the appendix. Appendicolith noted within the appendix. A small amount of free air is evident. There is moderate ascites with diffuse bowel wall thickening, likely due to under colitis secondary to peritonitis. Urinary bladder wall thickening is likely secondary to inflammation from peritonitis as well. 2.  No renal or ureteral calculus.  No hydronephrosis. 3.  No appreciable bowel obstruction. Critical Value/emergent results were called by telephone at the time of interpretation on 07/16/2017 at 3:07 pm to [place], PA, who verbally acknowledged these results. Electronically Signed   By: Lowella Grip III M.D.   On: 07/16/2017 15:08    Anti-infectives: Anti-infectives    Start     Dose/Rate Route Frequency Ordered Stop  07/16/17 2000  piperacillin-tazobactam (ZOSYN) IVPB 3.375 g     3.375 g 12.5 mL/hr over 240 Minutes Intravenous Every 8 hours 07/16/17 1332     07/16/17 2000  piperacillin-tazobactam (ZOSYN) IVPB 3.375 g  Status:  Discontinued     3.375 g 12.5 mL/hr over 240 Minutes Intravenous Every 8 hours 07/16/17 1950 07/16/17 2148   07/16/17 1400  piperacillin-tazobactam (ZOSYN) IVPB 3.375 g     3.375 g 100 mL/hr over 30 Minutes Intravenous  Once 07/16/17 1332 07/16/17 1556      Assessment/Plan: Problem List: Patient Active Problem List   Diagnosis Date Noted  . S/P appendectomy 07/16/2017  . 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  Continue IV abx and observation.   2 Days Post-Op    LOS: 2 days   Matt B. Hassell Done, MD, [facility] [phone] beeper [phone]  07/18/2017 9:31 AM

## 2017-07-19 MED ORDER — ACETAMINOPHEN 325 MG PO TABS
650.0000 mg | ORAL_TABLET | Freq: Four times a day (QID) | ORAL | Status: DC
  Administered 2017-07-19 – 2017-07-21 (×8): 650 mg via ORAL
  Filled 2017-07-19 (×9): qty 2

## 2017-07-19 MED ORDER — CLOMIPHENE CITRATE 50 MG PO TABS
50.0000 mg | ORAL_TABLET | Freq: Every day | ORAL | Status: DC
  Administered 2017-07-19 – 2017-07-22 (×4): 50 mg via ORAL
  Filled 2017-07-19 (×4): qty 1

## 2017-07-19 MED ORDER — ENSURE ENLIVE PO LIQD
237.0000 mL | Freq: Two times a day (BID) | ORAL | Status: DC
  Administered 2017-07-19 – 2017-07-22 (×5): 237 mL via ORAL

## 2017-07-19 NOTE — Progress Notes (Signed)
[facility] Progress Note  3 Days Post-Op  Subjective: CC:  Endorses abdominal distention and LUQ pain. Temporarily relieved by IV pain meds. Reports a small episode of vomiting yesterday. Tolerated clears at breakfast but was not able to eat a lot due to distention. No nausea/vomiting today. Denies flatus or BM. Is belching. urinating without hesitancy. Mobilizing in halls and using IS. Denies fever.  Objective: Vital signs in last 24 hours: Temp:  [99 F (37.2 C)-99.4 F (37.4 C)] 99.4 F (37.4 C) (10/29 0445) Pulse Rate:  [71-106] 71 (10/29 0445) Resp:  [16] 16 (10/29 0445) BP: (105-121)/(59-70) 105/59 (10/29 0445) SpO2:  [95 %-99 %] 95 % (10/29 0445) Last BM Date: 07/16/17  Intake/Output from previous day: 10/28 0701 - 10/29 0700 In: 1807 [P.O.:582; I.V.:1125; IV Piggyback:100] Out: 635 [Urine:575; Drains:60] Intake/Output this shift: No intake/output data recorded.  PE: Gen:  Alert, NAD, pleasant Card:  Regular rate and rhythm Pulm:  Normal effort, clear to auscultation bilaterally, pulling 1250 cc on IS. Abd: Soft, appropriately tender, mild distention, hypoactive BS.  JP drain - 60 cc/24h, serous Skin: warm and dry, no rashes  Psych: A&Ox3   Lab Results:   Recent Labs  07/17/17 0559 07/18/17 0222  WBC 13.3* 10.1  HGB 11.0* 10.8*  HCT 31.2* 31.1*  PLT 226 230   BMET  Recent Labs  07/16/17 1208 07/17/17 0559  NA 130* 134*  K 3.7 4.1  CL 94* 102  CO2 24 25  GLUCOSE 139* 134*  BUN 17 7  CREATININE 0.86 0.44*  CALCIUM 9.2 8.6*   PT/INR No results for input(s): LABPROT, INR in the last 72 hours. CMP     Component Value Date/Time   NA 134 (L) 07/17/2017 0559   K 4.1 07/17/2017 0559   CL 102 07/17/2017 0559   CO2 25 07/17/2017 0559   GLUCOSE 134 (H) 07/17/2017 0559   BUN 7 07/17/2017 0559   CREATININE 0.44 (L) 07/17/2017 0559   CALCIUM 8.6 (L) 07/17/2017 0559   PROT 7.7 07/16/2017 1208   ALBUMIN 4.0 07/16/2017 1208   AST 26  07/16/2017 1208   ALT 21 07/16/2017 1208   ALKPHOS 100 07/16/2017 1208   BILITOT 1.6 (H) 07/16/2017 1208   GFRNONAA >60 07/17/2017 0559   GFRAA >60 07/17/2017 0559   Lipase     Component Value Date/Time   LIPASE 14 07/16/2017 1208       Studies/Results: No results found.  Anti-infectives: Anti-infectives    Start     Dose/Rate Route Frequency Ordered Stop   07/16/17 2000  piperacillin-tazobactam (ZOSYN) IVPB 3.375 g     3.375 g 12.5 mL/hr over 240 Minutes Intravenous Every 8 hours 07/16/17 1332     07/16/17 2000  piperacillin-tazobactam (ZOSYN) IVPB 3.375 g  Status:  Discontinued     3.375 g 12.5 mL/hr over 240 Minutes Intravenous Every 8 hours 07/16/17 1950 07/16/17 2148   07/16/17 1400  piperacillin-tazobactam (ZOSYN) IVPB 3.375 g     3.375 g 100 mL/hr over 30 Minutes Intravenous  Once 07/16/17 1332 07/16/17 1556     Assessment/Plan PMH psychotic disorder- not currently on meds. Hypogonadism male - re-ordered patients home med, 50 mg clomid daily   Ruptured appendicitis POD#3 s/p laparoscopic appendectomy, placement 12F JP drain  - afebrile, VSS, leukocytosis resolved as of 10/28  - expected post-operative ileus. Continue clears and await further bowel function. Will add sips of ensure between meals however patient encouraged not to force oral intake.  - mobilize/IS  FEN: clears, ensure  ID: Zosyn 10/26 >>  VTE: SCD's, Lovenox  Foley: none    LOS: 3 days    Adam Phenix , [facility] 07/19/2017, 10:08 AM Pager: [phone] Consults: [phone] Mon-Fri 7:00 am-4:30 pm Sat-Sun 7:00 am-11:30 am

## 2017-07-20 LAB — BASIC METABOLIC PANEL
Anion gap: 8 (ref 5–15)
BUN: <5 mg/dL — ABNORMAL LOW (ref 6–20)
CO2: 24 mmol/L (ref 22–32)
Calcium: 7.7 mg/dL — ABNORMAL LOW (ref 8.9–10.3)
Chloride: 103 mmol/L (ref 101–111)
Creatinine, Ser: 0.53 mg/dL — ABNORMAL LOW (ref 0.61–1.24)
GFR calc Af Amer: >60 mL/min (ref >60)
GFR calc non Af Amer: >60 mL/min (ref >60)
Glucose, Bld: 91 mg/dL (ref 65–99)
Potassium: 3.6 mmol/L (ref 3.5–5.1)
Sodium: 135 mmol/L (ref 135–145)

## 2017-07-20 MED ORDER — OXYCODONE HCL 5 MG PO TABS
5.0000 mg | ORAL_TABLET | ORAL | Status: DC | PRN
  Administered 2017-07-20: 5 mg via ORAL
  Administered 2017-07-20 – 2017-07-22 (×2): 10 mg via ORAL
  Filled 2017-07-20: qty 1
  Filled 2017-07-20 (×2): qty 2

## 2017-07-20 MED ORDER — HYDROMORPHONE HCL 1 MG/ML IJ SOLN: 0.5000 mg | INTRAMUSCULAR | Status: DC | PRN

## 2017-07-20 NOTE — Progress Notes (Signed)
Patient ID: [name] [name], male   DOB: [birth year], 26 y.o.   MRN: [id number]  [facility] Progress Note  4 Days Post-Op  Subjective: CC-  Patient states that he had a bowel movement last night. Continues to pass flatus. He does report some persistent intermittent nausea, no emesis. Tolerating clears. Has already ambulated 2 laps in the hall this morning.  Objective: Vital signs in last 24 hours: Temp:  [98.7 F (37.1 C)-98.8 F (37.1 C)] 98.7 F (37.1 C) (10/30 0410) Pulse Rate:  [77-85] 85 (10/30 0410) Resp:  [17] 17 (10/30 0410) BP: (121-123)/(79-82) 121/79 (10/30 0410) SpO2:  [97 %-100 %] 97 % (10/30 0410) Last BM Date: 07/20/17  Intake/Output from previous day: 10/29 0701 - 10/30 0700 In: 5708.3 [P.O.:760; I.V.:4948.3] Out: 545 [Urine:500; Drains:45] Intake/Output this shift: No intake/output data recorded.  PE: Gen:  Alert, NAD, cooperative HEENT: EOM's intact, pupils equal and round Card:  RRR, no M/G/R heard Pulm:  CTAB, diminished sounds at lung bases, no W/R/R, effort normal Abd: Soft, mild distension, +BS in all 4 quadrants, no HSM, no hernia, incisions C/D/I, drain with serous drainage Psych: A&Ox3   Lab Results:   Recent Labs  07/18/17 0222  WBC 10.1  HGB 10.8*  HCT 31.1*  PLT 230   BMET  Recent Labs  07/20/17 0751  NA 135  K 3.6  CL 103  CO2 24  GLUCOSE 91  BUN <5*  CREATININE 0.53*  CALCIUM 7.7*   PT/INR No results for input(s): LABPROT, INR in the last 72 hours. CMP     Component Value Date/Time   NA 135 07/20/2017 0751   K 3.6 07/20/2017 0751   CL 103 07/20/2017 0751   CO2 24 07/20/2017 0751   GLUCOSE 91 07/20/2017 0751   BUN <5 (L) 07/20/2017 0751   CREATININE 0.53 (L) 07/20/2017 0751   CALCIUM 7.7 (L) 07/20/2017 0751   PROT 7.7 07/16/2017 1208   ALBUMIN 4.0 07/16/2017 1208   AST 26 07/16/2017 1208   ALT 21 07/16/2017 1208   ALKPHOS 100 07/16/2017 1208   BILITOT 1.6 (H) 07/16/2017 1208   GFRNONAA >60  07/20/2017 0751   GFRAA >60 07/20/2017 0751   Lipase     Component Value Date/Time   LIPASE 14 07/16/2017 1208       Studies/Results: No results found.  Anti-infectives: Anti-infectives    Start     Dose/Rate Route Frequency Ordered Stop   07/16/17 2000  piperacillin-tazobactam (ZOSYN) IVPB 3.375 g     3.375 g 12.5 mL/hr over 240 Minutes Intravenous Every 8 hours 07/16/17 1332     07/16/17 2000  piperacillin-tazobactam (ZOSYN) IVPB 3.375 g  Status:  Discontinued     3.375 g 12.5 mL/hr over 240 Minutes Intravenous Every 8 hours 07/16/17 1950 07/16/17 2148   07/16/17 1400  piperacillin-tazobactam (ZOSYN) IVPB 3.375 g     3.375 g 100 mL/hr over 30 Minutes Intravenous  Once 07/16/17 1332 07/16/17 1556       Assessment/Plan PMH psychotic disorder- not currently on meds. Hypogonadism male - home med, 50 mg clomid daily   Ruptured appendicitis S/p laparoscopic appendectomy, JP drain placement 10/26 Dr. Derrell Lolling - POD#4 - afebrile - drain with 45cc/24hr serous fluid - +BM and flatus, post-operative ileus improving.  FEN: IVF, fulls, Ensure  ID: Zosyn 10/26 >> day#5 VTE: SCD's, Lovenox  Foley: none   Plan - advance to full liquids. Mobilize/IS. Continue drain and IV antibiotics.   LOS: 4 days    BROOKE  Vallarie MareA MEUTH , [facility] 07/20/2017, 10:44 AM Pager: [phone] Consults: [phone] Mon-Fri 7:00 am-4:30 pm Sat-Sun 7:00 am-11:30 am

## 2017-07-21 LAB — CULTURE, BLOOD (ROUTINE X 2)
Culture: NO GROWTH
Culture: NO GROWTH
Special Requests: ADEQUATE
Special Requests: ADEQUATE

## 2017-07-21 MED ORDER — IBUPROFEN 600 MG PO TABS: 600.0000 mg | ORAL_TABLET | Freq: Four times a day (QID) | ORAL | Status: DC | PRN

## 2017-07-21 MED ORDER — ACETAMINOPHEN 500 MG PO TABS
1000.0000 mg | ORAL_TABLET | Freq: Three times a day (TID) | ORAL | Status: DC
  Administered 2017-07-21 – 2017-07-22 (×4): 1000 mg via ORAL
  Filled 2017-07-21 (×4): qty 2

## 2017-07-21 NOTE — Progress Notes (Signed)
5 Days Post-Op    CC:  Abdominal pain  Subjective: He looks pretty good, lives at home with his dad, and not working.  Port sites look great.  Drain is clear serous fluid.   Objective: Vital signs in last 24 hours: Temp:  [98.5 F (36.9 C)-99.3 F (37.4 C)] 99.3 F (37.4 C) (10/31 0519) Pulse Rate:  [78-86] 86 (10/31 0519) Resp:  [17-18] 17 (10/31 0519) BP: (123)/(71-78) 123/71 (10/31 0519) SpO2:  [98 %-100 %] 98 % (10/31 0519) Last BM Date: 07/20/17 1120 PO 250 IV 1755 urine Drain 50 Afebrile, VSS CMP OK    Intake/Output from previous day: 10/30 0701 - 10/31 0700 In: 3056.3 [P.O.:1120; I.V.:1686.3; IV Piggyback:250] Out: 1805 [Urine:1755; Drains:50] Intake/Output this shift: No intake/output data recorded.  General appearance: alert, cooperative and no distress Resp: clear to auscultation bilaterally GI: soft, + Bs, BM, serous fluid from the drain.    Lab Results:  No results for input(s): WBC, HGB, HCT, PLT in the last 72 hours.  BMET  Recent Labs  07/20/17 0751  NA 135  K 3.6  CL 103  CO2 24  GLUCOSE 91  BUN <5*  CREATININE 0.53*  CALCIUM 7.7*   PT/INR No results for input(s): LABPROT, INR in the last 72 hours.   Recent Labs Lab 07/16/17 1208  AST 26  ALT 21  ALKPHOS 100  BILITOT 1.6*  PROT 7.7  ALBUMIN 4.0     Lipase     Component Value Date/Time   LIPASE 14 07/16/2017 1208     Medications: . acetaminophen  650 mg Oral Q6H  . clomiPHENE  50 mg Oral Daily  . enoxaparin (LOVENOX) injection  40 mg Subcutaneous Q24H  . feeding supplement (ENSURE ENLIVE)  237 mL Oral BID BM  . Influenza vac split quadrivalent PF  0.5 mL Intramuscular Tomorrow-1000   . sodium chloride 75 mL/hr at 07/20/17 0437  . dextrose 5 % and 0.9% NaCl 1 mL (07/19/17 0728)  . lactated ringers    . piperacillin-tazobactam (ZOSYN)  IV 3.375 g (07/21/17 0451)   Anti-infectives    Start     Dose/Rate Route Frequency Ordered Stop   07/16/17 2000   piperacillin-tazobactam (ZOSYN) IVPB 3.375 g     3.375 g 12.5 mL/hr over 240 Minutes Intravenous Every 8 hours 07/16/17 1332     07/16/17 2000  piperacillin-tazobactam (ZOSYN) IVPB 3.375 g  Status:  Discontinued     3.375 g 12.5 mL/hr over 240 Minutes Intravenous Every 8 hours 07/16/17 1950 07/16/17 2148   07/16/17 1400  piperacillin-tazobactam (ZOSYN) IVPB 3.375 g     3.375 g 100 mL/hr over 30 Minutes Intravenous  Once 07/16/17 1332 07/16/17 1556     Assessment/Plan  PMH psychotic disorder- not currently on meds. Hypogonadism male- home med, 50 mg clomid daily   Ruptured appendicitis S/p laparoscopic appendectomy, JP drain placement 10/26 Dr. Derrell Lollingamirez - POD#5 - afebrile - drain with 45cc/24hr serous fluid - +BM and flatus, post-operative ileus improving.  FEN: IVF, fulls, Ensure  ID: Zosyn 10/26 >>day #6 VTE: SCD's, Lovenox  Foley: none    Plan:  Soft diet, mobilize more and see how he does.  Aim for DC next 24 hours if he does well with soft diet.     LOS: 5 days    Jennine Peddy 07/21/2017 [phone]

## 2017-07-22 MED ORDER — OXYCODONE HCL 5 MG PO TABS: 5.0000 mg | ORAL_TABLET | ORAL | 0 refills | Status: DC | PRN

## 2017-07-22 MED ORDER — ACETAMINOPHEN 500 MG PO TABS
ORAL_TABLET | ORAL | 0 refills | Status: DC
Start: 2017-07-22 — End: 2017-11-20

## 2017-07-22 MED ORDER — IBUPROFEN 200 MG PO TABS: ORAL_TABLET | ORAL | Status: DC

## 2017-07-22 MED ORDER — SACCHAROMYCES BOULARDII 250 MG PO CAPS
250.0000 mg | ORAL_CAPSULE | Freq: Two times a day (BID) | ORAL | Status: DC
  Administered 2017-07-22: 250 mg via ORAL
  Filled 2017-07-22: qty 1

## 2017-07-22 MED ORDER — AMOXICILLIN-POT CLAVULANATE 875-125 MG PO TABS: 1.0000 | ORAL_TABLET | Freq: Two times a day (BID) | ORAL | 0 refills | Status: DC

## 2017-07-22 MED ORDER — SACCHAROMYCES BOULARDII 250 MG PO CAPS: ORAL_CAPSULE | ORAL | Status: DC

## 2017-07-22 MED ORDER — AMOXICILLIN-POT CLAVULANATE 875-125 MG PO TABS
1.0000 | ORAL_TABLET | Freq: Two times a day (BID) | ORAL | Status: DC
  Administered 2017-07-22: 1 via ORAL
  Filled 2017-07-22: qty 1

## 2017-07-22 NOTE — Progress Notes (Signed)
6 Days Post-Op    CC:  Abdominal pain  Subjective: He looks good this AM.  Some loose stools yesterday.  Sites look fine, JP drain is still clear serous fluid.   Objective: Vital signs in last 24 hours: Temp:  [98.2 F (36.8 C)-98.7 F (37.1 C)] 98.2 F (36.8 C) (11/01 0443) Pulse Rate:  [67-98] 67 (11/01 0443) Resp:  [18] 18 (11/01 0443) BP: (105-124)/(59-70) 120/59 (11/01 0443) SpO2:  [100 %] 100 % (11/01 0443) Last BM Date: 07/21/17 PO 580 recorded 675 urine recorded Drain 5 recorded bm x 2 Afebrile, VSS Labs OK  Intake/Output from previous day: 10/31 0701 - 11/01 0700 In: 580 [P.O.:580] Out: 680 [Urine:675; Drains:5] Intake/Output this shift: No intake/output data recorded.  General appearance: alert, cooperative and no distress Resp: clear to auscultation bilaterally GI: soft, nontender, + BS, tolerating diet, + loose Bm's x 2.  Port sites looks good.  drain is clear serous fluid.  Lab Results:  No results for input(s): WBC, HGB, HCT, PLT in the last 72 hours.  BMET  Recent Labs  07/20/17 0751  NA 135  K 3.6  CL 103  CO2 24  GLUCOSE 91  BUN <5*  CREATININE 0.53*  CALCIUM 7.7*   PT/INR No results for input(s): LABPROT, INR in the last 72 hours.   Recent Labs Lab 07/16/17 1208  AST 26  ALT 21  ALKPHOS 100  BILITOT 1.6*  PROT 7.7  ALBUMIN 4.0     Lipase     Component Value Date/Time   LIPASE 14 07/16/2017 1208     Medications: . acetaminophen  1,000 mg Oral Q8H  . clomiPHENE  50 mg Oral Daily  . enoxaparin (LOVENOX) injection  40 mg Subcutaneous Q24H  . feeding supplement (ENSURE ENLIVE)  237 mL Oral BID BM    Assessment/Plan PMH psychotic disorder- not currently on meds. Hypogonadism male- home med, 50 mg clomid daily   Ruptured appendicitis S/p laparoscopic appendectomy, JP drain placement 10/26 Dr. Derrell Lollingamirez - POD#5 - afebrile - drain with 45cc/24hr serous fluid - +BM and flatus, post-operative ileus improving.  FEN:  IVF, fulls, Ensure  ID: Zosyn 10/26-10/31 Augmentin 07/22/17 =>>  Day 1/8 VTE: SCD's, Lovenox  Foley: none  Follow up:   DOW    Plan:  Home today, Probiotic, 8 days of Augmentin 14 days total of antibiotics.  DC drain prior to DC.  Follow up in the OFFice. I have personally reviewed the patients medication history on the [place] controlled substance database.  LOS: 6 days    Krissy Orebaugh 07/22/2017 [phone]

## 2017-07-22 NOTE — Discharge Summary (Signed)
[facility] Discharge Summary   Patient ID: [name] SessionsMaurice Llanas MRN: [id number] DOB/AGE: September 01, 1991 26 y.o.  Admit date: 07/16/2017 Discharge date: 07/22/2017  Discharge Diagnosis Patient Active Problem List   Diagnosis Date Noted  . S/P appendectomy 07/16/2017  . 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 Suspect appendicitis with an irregular focus of air in the right lower quadrant with appendiceal wall thickening, raising concern for potential rupture of the appendix. Appendicolith noted within the appendix. A small amount of free air is evident. There is moderate ascites with diffuse bowel wall thickening, likely due to under colitis secondary to peritonitis. Urinary bladder wall thickening is likely secondary to inflammation from peritonitis as well.  2.  No renal or ureteral calculus.  No hydronephrosis.  3.  No appreciable bowel obstruction.  Procedures Dr. Axel FillerArmando Ramirez (07/16/17) - Laparoscopic Appendectomy, placement 48F drain  Hospital Course:  26 y/o AA male who [facility] department by 3-4 days of worsening abdominal pain associated with nausea/vomiting/diarrhea. CT scan above significant for perforated appendicitis with signs of peritonitis on exam. The patient was taken to the OR for the above procedure, which he tolerated well. Post-operatively the patient had a mild ileus that resolved with time and bowel rest.  Diet was advanced as tolerated. Drain output remained serous and trended down. On POD#6 the patient was voiding well, tolerating diet, ambulating well, pain well controlled, vital signs stable, incisions c/d/i and felt stable for discharge home. Drain removed prior to discharge. He will go home on 8 days of oral Augmentin to complete a total of 14 days of antibiotics. Patient will follow  up in our office in 2 weeks and knows to call with questions or concerns.  Allergies as of 07/22/2017   No Known Allergies     Medication List    TAKE these medications   acetaminophen 500 MG tablet Commonly known as:  TYLENOL I would use tylenol 1000 mg every 8 hours for the next few days as primary pain control medicine.  You can also alternate with ibuprofen and as a last resort the Oxycodone.   You can buy this over the counter at any drug store.    DO NOT TAKE MORE THAN 4000 MG OF TYLENOL PER DAY.  IT CAN HARM YOUR LIVER.   amoxicillin-clavulanate 875-125 MG tablet Commonly known as:  AUGMENTIN Take 1 tablet by mouth every 12 (twelve) hours.   benztropine 0.5 MG tablet Commonly known as:  COGENTIN Take 1 tablet (0.5 mg total) by mouth daily.   clomiPHENE 50 MG tablet Commonly known as:  CLOMID Take 1 tablet (50 mg total) by mouth daily.   ibuprofen 200 MG tablet Commonly known as:  ADVIL,MOTRIN You can take 2-3 tablets every 6 hours as needed for pain.  You can alternate with Tylenol.  Use these 2 medicines first for pain control and use Oxycodone last.  You can buy this at any drug store.   oxyCODONE 5 MG immediate release tablet Commonly known as:  Oxy IR/ROXICODONE Take 1-2 tablets (5-10 mg total) by mouth every 4 (four) hours as needed for moderate pain or severe pain.   risperiDONE 0.5 MG tablet Commonly known as:  RISPERDAL Take 1 tablet (0.5 mg total) by mouth 2 (two) times daily.   saccharomyces boulardii 250 MG capsule Commonly known as:  FLORASTOR You can buy this at any drug  store it is with the stool Medicines.   You just follow the package  directions and use for the next month.      Follow-up Information    [facility] Follow up on 08/03/2017.   Specialty:  General Surgery Why:  Your appointment is at 11:30 AM.  Be at the office 30 minutes early for check in.  Bring photo ID and insurance information. Contact information: [address] [place] [address] [phone]        [facility]. Schedule an appointment as soon as possible for a visit.   Contact information: [address] [place] [address] [phone]         Signed: Hosie Spangle, [facility] 07/22/2017, 11:25 AM Pager: [phone] Consults: [phone] Mon-Fri 7:00 am-4:30 pm Sat-Sun 7:00 am-11:30 am

## 2017-07-22 NOTE — Care Management Note (Signed)
Case Management Note  Patient Details  Name: [name] [name] MRN: [id number] Date of Birth: [birth year]  Subjective/Objective:                    Action/Plan:   Expected Discharge Date:  07/22/17               Expected Discharge Plan:  Home/Self Care  In-House Referral:     Discharge planning Services  CM Consult, MATCH Program, Medication Assistance, Indigent Health Clinic  Post Acute Care Choice:    Choice offered to:  Patient  DME Arranged:    DME Agency:     HH Arranged:    HH Agency:     Status of Service:  Completed, signed off  If discussed at [employer] Length of Stay Meetings, dates discussed:    Additional Comments:  Kingsley PlanWile, [name] Yankovich Marie, RN 07/22/2017, 11:06 AM

## 2017-07-22 NOTE — Discharge Instructions (Signed)
Laparoscopic Appendectomy, Adult, Care After °Refer to this sheet in the next few weeks. These instructions provide you with information about caring for yourself after your procedure. Your health care provider may also give you more specific instructions. Your treatment has been planned according to current medical practices, but problems sometimes occur. Call your health care provider if you have any problems or questions after your procedure. °What can I expect after the procedure? °After the procedure, it is common to have: °· A decrease in your energy level. °· Mild pain in the area where the surgical cuts (incisions) were made. °· Constipation. This can be caused by pain medicine and a decrease in your activity. ° °Follow these instructions at home: °Medicines °· Take over-the-counter and prescription medicines only as told by your health care provider. °· Do not drive for 24 hours if you received a sedative. °· Do not drive or operate heavy machinery while taking prescription pain medicine. °· If you were prescribed an antibiotic medicine, take it as told by your health care provider. Do not stop taking the antibiotic even if you start to feel better. °Activity °· For 3 weeks or as long as told by your health care provider: °? Do not lift anything that is heavier than 10 pounds (4.5 kg). °? Do not play contact sports. °· Gradually return to your normal activities. Ask your health care provider what activities are safe for you. °Bathing °· Keep your incisions clean and dry. Clean them as often as told by your health care provider: °? Gently wash the incisions with soap and water. °? Rinse the incisions with water to remove all soap. °? Pat the incisions dry with a clean towel. Do not rub the incisions. °· You may take showers after 48 hours. °· Do not take baths, swim, or use hot tubs for 2 weeks or as told by your health care provider. °Incision care °· Follow instructions from your healthcare provider about  how to take care of your incisions. Make sure you: °? Wash your hands with soap and water before you change your bandage (dressing). If soap and water are not available, use hand sanitizer. °? Change your dressing as told by your health care provider. °? Leave stitches (sutures), skin glue, or adhesive strips in place. These skin closures may need to stay in place for 2 weeks or longer. If adhesive strip edges start to loosen and curl up, you may trim the loose edges. Do not remove adhesive strips completely unless your health care provider tells you to do that. °· Check your incision areas every day for signs of infection. Check for: °? More redness, swelling, or pain. °? More fluid or blood. °? Warmth. °? Pus or a bad smell. °Other Instructions °· If you were sent home with a drain, follow instructions from your health care provider about how to care for the drain and how to empty it. °· Take deep breaths. This helps to prevent your lungs from becoming inflamed. °· To relieve and prevent constipation: °? Drink plenty of fluids. °? Eat plenty of fruits and vegetables. °· Keep all follow-up visits as told by your health care provider. This is important. °Contact a health care provider if: °· You have more redness, swelling, or pain around an incision. °· You have more fluid or blood coming from an incision. °· Your incision feels warm to the touch. °· You have pus or a bad smell coming from an incision or dressing. °· Your incision   edges break open after your sutures have been removed. °· You have increasing pain in your shoulders. °· You feel dizzy or you faint. °· You develop shortness of breath. °· You keep feeling nauseous or vomiting. °· You have diarrhea or you cannot control your bowel functions. °· You lose your appetite. °· You develop swelling or pain in your legs. °Get help right away if: °· You have a fever. °· You develop a rash. °· You have difficulty breathing. °· You have sharp pains in your  chest. °This information is not intended to replace advice given to you by your health care provider. Make sure you discuss any questions you have with your health care provider. °Document Released: 09/07/2005 Document Revised: 02/07/2016 Document Reviewed: 02/25/2015 °Elsevier Interactive Patient Education © 2017 Elsevier Inc. ° °CCS ______[facility] °LAPAROSCOPIC SURGERY: POST OP INSTRUCTIONS °Always review your discharge instruction sheet given to you by the facility where your surgery was performed. °IF YOU HAVE DISABILITY OR FAMILY LEAVE FORMS, YOU MUST BRING THEM TO THE OFFICE FOR PROCESSING.   °DO NOT GIVE THEM TO YOUR DOCTOR. ° °1. A prescription for pain medication may be given to you upon discharge.  Take your pain medication as prescribed, if needed.  If narcotic pain medicine is not needed, then you may take acetaminophen (Tylenol) or ibuprofen (Advil) as needed. °2. Take your usually prescribed medications unless otherwise directed. °3. If you need a refill on your pain medication, please contact your pharmacy.  They will contact our office to request authorization. Prescriptions will not be filled after 5pm or on week-ends. °4. You should follow a light diet the first few days after arrival home, such as soup and crackers, etc.  Be sure to include lots of fluids daily. °5. Most patients will experience some swelling and bruising in the area of the incisions.  Ice packs will help.  Swelling and bruising can take several days to resolve.  °6. It is common to experience some constipation if taking pain medication after surgery.  Increasing fluid intake and taking a stool softener (such as Colace) will usually help or prevent this problem from occurring.  A mild laxative (Milk of Magnesia or Miralax) should be taken according to package instructions if there are no bowel movements after 48 hours. °7. Unless discharge instructions indicate otherwise, you may remove your bandages 24-48  hours after surgery, and you may shower at that time.  You may have steri-strips (small skin tapes) in place directly over the incision.  These strips should be left on the skin for 7-10 days.  If your surgeon used skin glue on the incision, you may shower in 24 hours.  The glue will flake off over the next 2-3 weeks.  Any sutures or staples will be removed at the office during your follow-up visit. °8. ACTIVITIES:  You may resume regular (light) daily activities beginning the next day--such as daily self-care, walking, climbing stairs--gradually increasing activities as tolerated.  You may have sexual intercourse when it is comfortable.  Refrain from any heavy lifting or straining until approved by your doctor. °a. You may drive when you are no longer taking prescription pain medication, you can comfortably wear a seatbelt, and you can safely maneuver your car and apply brakes. °b. RETURN TO WORK:  __________________________________________________________ °9. You should see your doctor in the office for a follow-up appointment approximately 2-3 weeks after your surgery.  Make sure that you call for this appointment within a day or   two after you arrive home to insure a convenient appointment time. °10. OTHER INSTRUCTIONS: __________________________________________________________________________________________________________________________ __________________________________________________________________________________________________________________________ °WHEN TO CALL YOUR DOCTOR: °1. Fever over 101.0 °2. Inability to urinate °3. Continued bleeding from incision. °4. Increased pain, redness, or drainage from the incision. °5. Increasing abdominal pain ° °The clinic staff is available to answer your questions during regular business hours.  Please don’t hesitate to call and ask to speak to one of the nurses for clinical concerns.  If you have a medical emergency, go to the nearest emergency room or call  911.  A surgeon from [facility] is always on call at the hospital. °[address], [address], [place], [place]  [address] ? [address] [address], [place], [place]   [address] °[phone] ? [phone] ? FAX [phone] °Web site: [email or web address] ° °

## 2017-07-22 NOTE — Progress Notes (Signed)
Pt was JP drain was removed this afternoon with out difficulty, Pt denied any pain or discomfort through procedure.  Pt as given his discharge instruction and chance to ask questions, and pt was given his prescriptions for discharge.

## 2017-11-20 ENCOUNTER — Other Ambulatory Visit: Payer: Self-pay

## 2017-11-20 ENCOUNTER — Encounter (HOSPITAL_COMMUNITY): Payer: Self-pay

## 2017-11-20 ENCOUNTER — Encounter (HOSPITAL_COMMUNITY): Payer: Self-pay | Admitting: Emergency Medicine

## 2017-11-20 ENCOUNTER — Emergency Department (HOSPITAL_COMMUNITY)
Admission: EM | Admit: 2017-11-20 | Discharge: 2017-11-22 | Disposition: A | Discharge status: Home / Self Care | Payer: PRIVATE HEALTH INSURANCE | Attending: Emergency Medicine | Admitting: Emergency Medicine

## 2017-11-20 ENCOUNTER — Ambulatory Visit (HOSPITAL_COMMUNITY)
Admission: RE | Admit: 2017-11-20 | Discharge: 2017-11-20 | Disposition: A | Discharge status: Home / Self Care | Payer: PRIVATE HEALTH INSURANCE | Attending: Psychiatry | Admitting: Psychiatry

## 2017-11-20 DIAGNOSIS — F19950 Other psychoactive substance use, unspecified with psychoactive substance-induced psychotic disorder with delusions: Secondary | ICD-10-CM | POA: Diagnosis present

## 2017-11-20 DIAGNOSIS — F1290 Cannabis use, unspecified, uncomplicated: Secondary | ICD-10-CM | POA: Diagnosis not present

## 2017-11-20 DIAGNOSIS — Z87891 Personal history of nicotine dependence: Secondary | ICD-10-CM | POA: Diagnosis not present

## 2017-11-20 DIAGNOSIS — F209 Schizophrenia, unspecified: Secondary | ICD-10-CM | POA: Insufficient documentation

## 2017-11-20 DIAGNOSIS — R45851 Suicidal ideations: Secondary | ICD-10-CM | POA: Insufficient documentation

## 2017-11-20 DIAGNOSIS — R440 Auditory hallucinations: Secondary | ICD-10-CM | POA: Insufficient documentation

## 2017-11-20 DIAGNOSIS — R454 Irritability and anger: Secondary | ICD-10-CM | POA: Diagnosis not present

## 2017-11-20 DIAGNOSIS — R456 Violent behavior: Secondary | ICD-10-CM | POA: Diagnosis not present

## 2017-11-20 LAB — CBC
HCT: 36.1 % — ABNORMAL LOW (ref 39.0–52.0)
Hemoglobin: 12.7 g/dL — ABNORMAL LOW (ref 13.0–17.0)
MCH: 30 pg (ref 26.0–34.0)
MCHC: 35.2 g/dL (ref 30.0–36.0)
MCV: 85.1 fL (ref 78.0–100.0)
Platelets: 294 10*3/uL (ref 150–400)
RBC: 4.24 MIL/uL (ref 4.22–5.81)
RDW: 12.5 % (ref 11.5–15.5)
WBC: 10 10*3/uL (ref 4.0–10.5)

## 2017-11-20 LAB — COMPREHENSIVE METABOLIC PANEL
ALT: 18 U/L (ref 17–63)
AST: 21 U/L (ref 15–41)
Albumin: 4.2 g/dL (ref 3.5–5.0)
Alkaline Phosphatase: 132 U/L — ABNORMAL HIGH (ref 38–126)
Anion gap: 10 (ref 5–15)
BUN: 9 mg/dL (ref 6–20)
CO2: 23 mmol/L (ref 22–32)
Calcium: 8.9 mg/dL (ref 8.9–10.3)
Chloride: 106 mmol/L (ref 101–111)
Creatinine, Ser: 0.47 mg/dL — ABNORMAL LOW (ref 0.61–1.24)
GFR calc Af Amer: >60 mL/min (ref >60)
GFR calc non Af Amer: >60 mL/min (ref >60)
Glucose, Bld: 93 mg/dL (ref 65–99)
Potassium: 3.6 mmol/L (ref 3.5–5.1)
Sodium: 139 mmol/L (ref 135–145)
Total Bilirubin: 0.6 mg/dL (ref 0.3–1.2)
Total Protein: 7 g/dL (ref 6.5–8.1)

## 2017-11-20 LAB — RAPID URINE DRUG SCREEN, HOSP PERFORMED
Amphetamines: NOT DETECTED
Barbiturates: NOT DETECTED
Benzodiazepines: NOT DETECTED
Cocaine: NOT DETECTED
Opiates: NOT DETECTED
Tetrahydrocannabinol: POSITIVE — AB

## 2017-11-20 LAB — SALICYLATE LEVEL: Salicylate Lvl: <7 mg/dL (ref 2.8–30.0)

## 2017-11-20 LAB — ACETAMINOPHEN LEVEL: Acetaminophen (Tylenol), Serum: <10 ug/mL — ABNORMAL LOW (ref 10–30)

## 2017-11-20 LAB — ETHANOL: Alcohol, Ethyl (B): <10 mg/dL (ref <10)

## 2017-11-20 MED ORDER — OLANZAPINE 5 MG PO TBDP
5.0000 mg | ORAL_TABLET | Freq: Two times a day (BID) | ORAL | Status: DC | PRN
  Administered 2017-11-20 – 2017-11-21 (×2): 5 mg via ORAL
  Filled 2017-11-20 (×2): qty 1

## 2017-11-20 MED ORDER — TRAZODONE HCL 100 MG PO TABS
100.0000 mg | ORAL_TABLET | Freq: Every day | ORAL | Status: DC
  Administered 2017-11-21: 100 mg via ORAL
  Filled 2017-11-20 (×2): qty 1

## 2017-11-20 MED ORDER — HYDROXYZINE HCL 25 MG PO TABS
50.0000 mg | ORAL_TABLET | Freq: Three times a day (TID) | ORAL | Status: DC
  Administered 2017-11-21 – 2017-11-22 (×3): 50 mg via ORAL
  Filled 2017-11-20 (×4): qty 2

## 2017-11-20 MED ORDER — TRAZODONE HCL 100 MG PO TABS: 100.0000 mg | ORAL_TABLET | Freq: Every day | ORAL | Status: DC

## 2017-11-20 MED ORDER — ONDANSETRON HCL 4 MG PO TABS: 4.0000 mg | ORAL_TABLET | Freq: Three times a day (TID) | ORAL | Status: DC | PRN

## 2017-11-20 MED ORDER — LORAZEPAM 1 MG PO TABS
1.0000 mg | ORAL_TABLET | ORAL | Status: AC | PRN
  Administered 2017-11-20: 1 mg via ORAL
  Filled 2017-11-20: qty 1

## 2017-11-20 MED ORDER — ALUM & MAG HYDROXIDE-SIMETH 200-200-20 MG/5ML PO SUSP: 30.0000 mL | Freq: Four times a day (QID) | ORAL | Status: DC | PRN

## 2017-11-20 MED ORDER — ACETAMINOPHEN 325 MG PO TABS: 650.0000 mg | ORAL_TABLET | ORAL | Status: DC | PRN

## 2017-11-20 MED ORDER — HYDROXYZINE HCL 25 MG PO TABS: 25.0000 mg | ORAL_TABLET | Freq: Three times a day (TID) | ORAL | Status: DC

## 2017-11-20 MED ORDER — ZOLPIDEM TARTRATE 5 MG PO TABS: 5.0000 mg | ORAL_TABLET | Freq: Every evening | ORAL | Status: DC | PRN

## 2017-11-20 MED ORDER — NICOTINE 21 MG/24HR TD PT24
21.0000 mg | MEDICATED_PATCH | Freq: Every day | TRANSDERMAL | Status: DC
  Administered 2017-11-20 – 2017-11-22 (×3): 21 mg via TRANSDERMAL
  Filled 2017-11-20 (×3): qty 1

## 2017-11-20 NOTE — ED Notes (Signed)
Pt admitted to room #40, pt anxious, increasingly agitated "I'm loosing control." pt endorsing AVH. Pt reports "the voices are talking about me." Pt endorsing SI. Denies HI. Thought blocking noted. Encourgement and support provided. Special checks q 15 mins in place for safety, video monitoring in place. Will continue to monitor.

## 2017-11-20 NOTE — ED Notes (Signed)
[employer] HAS REMAINED AT BEDSIDE DURING TCU ADMISSION. PT HAS BEEN WITHOUT EVENT.

## 2017-11-20 NOTE — H&P (Signed)
Behavioral Health Medical Screening Exam  [name] [name] is an 27 y.o. male arrived voluntarily unaccompanied to [facility] with complaints of intrusive auditory and visual hallucincations. Patient endorsing active suicide ideations with plan to shoot self. Patient appears acutely psychotic. He became agitated while awaiting to be transported to the ED and started throwing things and punched hole in the wall. Patient yelling, "I want this to stop, look at me, am I standing here right now".  Total Time spent with patient: 30 minutes  Psychiatric Specialty Exam: Physical Exam  Vitals reviewed. Constitutional: He is oriented to person, place, and time. He appears well-developed and well-nourished.  HENT:  Head: Normocephalic and atraumatic.  Eyes: Pupils are equal, round, and reactive to light.  Neck: Normal range of motion.  Cardiovascular: Normal rate, regular rhythm and normal heart sounds.  Respiratory: Effort normal and breath sounds normal.  GI: Soft. Bowel sounds are normal.  Musculoskeletal: Normal range of motion.  Neurological: He is alert and oriented to person, place, and time.  Skin: Skin is warm and dry.    Review of Systems  Psychiatric/Behavioral: Positive for depression and hallucinations. Negative for substance abuse and suicidal ideas. The patient is nervous/anxious. The patient does not have insomnia.   All other systems reviewed and are negative.   Blood pressure 124/89, pulse 86, temperature 98.6 F (37 C), temperature source Oral, resp. rate 18, SpO2 100 %.There is no height or weight on file to calculate BMI.  General Appearance: Casual, Disheveled and malodorous  Eye Contact:  Fair  Speech:  Clear and Coherent and Normal Rate  Volume:  Normal  Mood:  Anxious and Depressed  Affect:  Congruent and Constricted  Thought Process:  Descriptions of Associations: Loose  Orientation:  Full (Time, Place, and Person)  Thought Content:  Hallucinations: Auditory Visual   Suicidal Thoughts:  Yes.  with intent/plan  Homicidal Thoughts:  Yes.  with intent/plan  Memory:  Immediate;   Good Recent;   Fair Remote;   Fair  Judgement:  Impaired  Insight:  Lacking  Psychomotor Activity:  Increased  Concentration: Concentration: Fair and Attention Span: Fair  Recall:  Good  Fund of Knowledge:Fair  Language: Good  Akathisia:  Negative  Handed:  Right  AIMS (if indicated):     Assets:  Communication Skills Desire for Improvement Financial Resources/Insurance Housing Physical Health  Sleep:       Musculoskeletal: Strength & Muscle Tone: within normal limits Gait & Station: normal Patient leans: N/A  Blood pressure 124/89, pulse 86, temperature 98.6 F (37 C), temperature source Oral, resp. rate 18, SpO2 100 %.  Recommendations:  Based on my evaluation the patient appears to have an emergency medical condition for which I recommend the patient be transferred to the emergency department for further evaluation.  Patient to is recommended for psych inpatient hospitalization following medical clearance.   Delila PereyraJustina A Terald Jump, NP 11/20/2017, 10:32 AM

## 2017-11-20 NOTE — ED Provider Notes (Signed)
[facility]-EMERGENCY DEPT Provider Note   CSN: [id number] Arrival date & time: 11/20/17  1150     History   Chief Complaint Chief Complaint  Patient presents with  . Suicidal  . Homicidal    HPI [name] [name] is a 27 y.o. male.  HPI  27 year old male with a history of schizophrenia presents from behavioral health for medical clearance.  Patient states that he walked into behavioral health seeking help.  He states he has been having hallucinations for a long time.  He states he hears a voice in his head.  Today he is feeling suicidal and while he cannot tell me a specific plan he states he does have one.  While at behavioral health waiting for transport patient kicked a wall.  He states it was due to the voices in his head.  He does not feel homicidal or angry towards anyone else.  He denies any fevers or acute pain.  He was placed in four-point restraints upon arrival to the ED by nursing staff.  Past Medical History:  Diagnosis Date  . Mental disorder   . Schizophrenia [facility])     Patient Active Problem List   Diagnosis Date Noted  . S/P appendectomy 07/16/2017  . 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 LAPAROSCOPIC APPENDECTOMY N/A 07/16/2017   Procedure: APPENDECTOMY LAPAROSCOPIC;  Surgeon: Axel Filler, MD;  [facility] OR;  Service: General;  Laterality: N/A;  . LAPAROTOMY N/A 07/16/2017   Procedure: POSSIBLE OPEN  LAPAROTOMY;  Surgeon: Axel Filler, MD;  Location: [facility] OR;  Service: General;  Laterality: N/A;  . NO PAST SURGERIES         Home Medications    Prior to Admission medications   Not on File    Family History Family History  Problem Relation Age of Onset  . Other Mother   . Other Brother     Social History Social History   Tobacco Use  . Smoking status: Former Smoker    Types: Cigarettes   Last attempt to quit: 09/21/2016    Years since quitting: 1.1  . Smokeless tobacco: Never Used  Substance Use Topics  . Alcohol use: No    Frequency: Never  . Drug use: Yes    Types: Marijuana    Comment: denies use of marijuana 07/16/17     Allergies   Patient has no known allergies.   Review of Systems Review of Systems  Constitutional: Negative for fever.  Respiratory: Negative for cough.   Psychiatric/Behavioral: Positive for hallucinations and suicidal ideas.  All other systems reviewed and are negative.    Physical Exam Updated Vital Signs BP 118/81 (BP Location: Left Arm)   Pulse 68   Temp 98.1 F (36.7 C) (Oral)   Resp 18   SpO2 98%   Physical Exam  Constitutional: He is oriented to person, place, and time. He appears well-developed and well-nourished. No distress.  HENT:  Head: Normocephalic and atraumatic.  Right Ear: External ear normal.  Left Ear: External ear normal.  Nose: Nose normal.  Eyes: Right eye exhibits no discharge. Left eye exhibits no discharge.  Neck: Neck supple.  Cardiovascular: Normal rate, regular rhythm and normal heart sounds.  Pulmonary/Chest: Effort normal and breath sounds normal.  Abdominal: Soft. There is no tenderness.  Musculoskeletal: He exhibits no edema.  Neurological: He is alert and oriented to person, place, and time.  Skin: Skin is warm and dry. He is not diaphoretic.  Psychiatric: His mood appears not anxious. His affect is not angry. His speech is not rapid and/or pressured. He is not agitated, not aggressive and not actively hallucinating. He expresses suicidal ideation.  Resting comfortably, no acute agitation or anger  Nursing note and vitals reviewed.    ED Treatments / Results  Labs (all labs ordered are listed, but only abnormal results are displayed) Labs Reviewed  COMPREHENSIVE METABOLIC PANEL - Abnormal; Notable for the following components:      Result Value   Creatinine, Ser 0.47 (*)    Alkaline  Phosphatase 132 (*)    All other components within normal limits  ACETAMINOPHEN LEVEL - Abnormal; Notable for the following components:   Acetaminophen (Tylenol), Serum <10 (*)    All other components within normal limits  CBC - Abnormal; Notable for the following components:   Hemoglobin 12.7 (*)    HCT 36.1 (*)    All other components within normal limits  RAPID URINE DRUG SCREEN, HOSP PERFORMED - Abnormal; Notable for the following components:   Tetrahydrocannabinol POSITIVE (*)    All other components within normal limits  ETHANOL  SALICYLATE LEVEL    EKG  EKG Interpretation None       Radiology No results found.  Procedures Procedures (including critical care time)  Medications Ordered in ED Medications  OLANZapine zydis (ZYPREXA) disintegrating tablet 5 mg (5 mg Oral Given 11/20/17 1351)    And  LORazepam (ATIVAN) tablet 1 mg (1 mg Oral Given 11/20/17 1430)  acetaminophen (TYLENOL) tablet 650 mg (not administered)  ondansetron (ZOFRAN) tablet 4 mg (not administered)  alum & mag hydroxide-simeth (MAALOX/MYLANTA) 200-200-20 MG/5ML suspension 30 mL (not administered)  nicotine (NICODERM CQ - dosed in mg/24 hours) patch 21 mg (21 mg Transdermal Patch Applied 11/20/17 1400)  traZODone (DESYREL) tablet 100 mg (not administered)  hydrOXYzine (ATARAX/VISTARIL) tablet 50 mg (50 mg Oral Not Given 11/20/17 1630)     Initial Impression / Assessment and Plan / ED Course  I have reviewed the triage vital signs and the nursing notes.  Pertinent labs & imaging results that were available during my care of the patient were reviewed by me and considered in my medical decision making (see chart for details).     Patient is currently suicidal.  His vital signs are unremarkable and his lab work is benign.  I think he is medically stable for psychiatric admission.  Psych is currently looking for placement.  While he was aggressive briefly at behavioral health by kicking a wall, he has been  calm and collected here.  He does not appear to need chemical or physical restraints.  If he were to try to leave, I think he would need to be involuntarily committed.  Otherwise, he is stable and awaiting placement.  Final Clinical Impressions(s) / ED Diagnoses   Final diagnoses:  Suicidal thoughts  Auditory hallucination    ED Discharge Orders    None       Pricilla LovelessGoldston, [place]elle [place]e, MD 11/20/17 1654

## 2017-11-20 NOTE — ED Notes (Signed)
ED Provider at bedside. EDP [place] 

## 2017-11-20 NOTE — ED Notes (Signed)
SBAR Report received from previous nurse. Pt received calm and visible on unit. Pt resting and unable to participate in assessment for current SI/ HI, A/V H, depression, anxiety, or pain at this time, but appears otherwise stable and free of distress. Pt reminded of camera surveillance, q 15 min rounds, and rules of the milieu. Will continue to assess.

## 2017-11-20 NOTE — ED Notes (Signed)
ED Provider at bedside. 

## 2017-11-20 NOTE — ED Triage Notes (Addendum)
Per [employer] BADGE # 388. Pt [facility]. Transferred here for medical clearance. [facility] [employer] called and upon their arrival pt approached officer demanding officer to shoot him.  Pt cuffed and transferred here for medical evaluation after EMS evaluated and cleared for transport.  Charge Patti D RN ordered for pt to be restrained upon arrival to ED for safety.  Pt calm and cooperative. Pt tolerated. Pt arrived to TCU with restraints 4 pts only. Passive with care. [employer] and security present.

## 2017-11-20 NOTE — ED Notes (Signed)
ED TO INPATIENT HANDOFF REPORT  Name/Age/Gender Grayce SessionsMaurice Dresner 27 y.o. male  Code Status    Code Status Orders  (From admission, onward)        Start     Ordered   11/20/17 1216  Full code  Continuous     11/20/17 1216    Code Status History    Date Active Date Inactive Code Status Order ID Comments User Context   07/16/2017 19:51 07/22/2017 16:56 Full Code [id number]  Axel Filleramirez, Armando, MD Inpatient   02/26/2017 17:48 02/28/2017 19:32 Full Code [id number]  Abelino DerrickMackuen, Courteney Lyn, MD ED   08/28/2012 20:32 08/30/2012 01:57 Full Code [id number]  Jones SkeneBonk, John-Adam, MD ED      Home/SNF/Other Home  Chief Complaint suicidal   Level of Care/Admitting Diagnosis ED Disposition    None      Medical History Past Medical History:  Diagnosis Date  . Mental disorder   . Schizophrenia (HCC)     Allergies No Known Allergies  IV Location/Drains/Wounds Patient Lines/Drains/Airways Status   Active Line/Drains/Airways    Name:   Placement date:   Placement time:   Site:   Days:   Incision (Closed) 07/16/17 Abdomen Other (Comment)   07/16/17    1749     127   Incision - 3 Ports Abdomen 1: Umbilicus 2: Umbilicus;Lower 3: Left;Lateral   07/16/17    1753     127          Labs/Imaging No results found for this or any previous visit (from the past 48 hour(s)). No results found.  Pending Labs Unresulted Labs (From admission, onward)   Start     Ordered   11/20/17 1138  Comprehensive metabolic panel  STAT,   STAT     11/20/17 1138   11/20/17 1138  Ethanol  Once,   STAT     11/20/17 1138   11/20/17 1138  Salicylate level  Once,   STAT     11/20/17 1138   11/20/17 1138  Acetaminophen level  Once,   STAT     11/20/17 1138   11/20/17 1138  cbc  Once,   STAT     11/20/17 1138   11/20/17 1138  Rapid urine drug screen (hospital performed)  Once,   STAT     11/20/17 1138      Vitals/Pain There were no vitals filed for this visit.  Isolation Precautions No active  isolations  Medications Medications  OLANZapine zydis (ZYPREXA) disintegrating tablet 5 mg (not administered)    And  LORazepam (ATIVAN) tablet 1 mg (not administered)  acetaminophen (TYLENOL) tablet 650 mg (not administered)  zolpidem (AMBIEN) tablet 5 mg (not administered)  ondansetron (ZOFRAN) tablet 4 mg (not administered)  alum & mag hydroxide-simeth (MAALOX/MYLANTA) 200-200-20 MG/5ML suspension 30 mL (not administered)  nicotine (NICODERM CQ - dosed in mg/24 hours) patch 21 mg (not administered)    Mobility walks

## 2017-11-20 NOTE — BH Assessment (Signed)
Assessment Note  [name] [name] is an 27 y.o. male. Patient presented voluntarily to [facility] for assessment. He says he drove himself to assessment. When asked why he was presenting to [facility], pt replies, "I see things and I hear things and I don't feel normal". Pt is malodorous with poor hygiene. His affect is preoccupied and anxious. Pt reports he punched himself in the head today because "I just don't want to feel this." He reports he has a twin brother who is on psych meds for psychosis and brother is doing well. Per chart review, pt was admitted to [facility] in 2013 for psychosis. He reports inpatient admissions most recently in [place]. When asked about pt's mood, he says, "I don't even know. I don't feel sad or happy". He reports he is experiencing auditory hallucinations during the assessment. He reports he occasionally experiences visual hallucinations. Pt says he often feels like "people are standing behind me" when he plays video games. Pt says, "I try to be okay but I'm not okay". When asked about coming in an inpatient psych unit today, pt says that he doesn't need to come in and stay. Pt says he came to [facility] to see if he can get on some meds. Pt is slow to answer most questions and he appears to be responding to internal stimuli. Pt is poor historian as he doesn't answer several questions asked.   Diagnosis: Other specified schizophrenia spectrum and other psychotic Disorders  Past Medical History:  Past Medical History:  Diagnosis Date  . Mental disorder   . Schizophrenia [facility])     Past Surgical History:  Procedure Laterality Date  . LAPAROSCOPIC APPENDECTOMY N/A 07/16/2017   Procedure: APPENDECTOMY LAPAROSCOPIC;  Surgeon: Axel Filler, MD;  Location: [facility] OR;  Service: General;  Laterality: N/A;  . LAPAROTOMY N/A 07/16/2017   Procedure: POSSIBLE OPEN  LAPAROTOMY;  Surgeon: Axel Filler, MD;  Location[facility] OR;  Service: General;  Laterality: N/A;  . NO PAST SURGERIES      Family  History:  Family History  Problem Relation Age of Onset  . Other Mother   . Other Brother     Social History:  reports that he quit smoking about 13 months ago. His smoking use included cigarettes. he has never used smokeless tobacco. He reports that he uses drugs. Drug: Marijuana. He reports that he does not drink alcohol.  Additional Social History:     CIWA:   COWS:    Allergies: No Known Allergies  Home Medications:  (Not in a hospital admission)  OB/GYN Status:  No LMP for male patient.  General Assessment Data Location of Assessment: [facility] TTS Assessment: In system Is this a Tele or Face-to-Face Assessment?: Face-to-Face Is this an Initial Assessment or a Re-assessment for this encounter?: Initial Assessment Marital status: Single Maiden name: Venice Isbell Is patient pregnant?: No Pregnancy Status: No Living Arrangements: Other relatives(twin brother) Can pt return to current living arrangement?: Yes Admission Status: Voluntary Is patient capable of signing voluntary admission?: Yes Referral Source: Self/Family/Friend Insurance type: generic Chief Strategy Officer Exam [facility] Walk-in ONLY) Medical Exam completed: Yes  Crisis Care Plan Living Arrangements: Other relatives(twin brother) Name of Psychiatrist: none Name of Therapist: none  Education Status Is patient currently in school?: No Highest grade of school patient has completed: 12  Risk to self with the past 6 months Suicidal Ideation: Yes-Currently Present Has patient been a risk to self within the past 6 months prior to admission? : (  unable to assess) Suicidal Intent: (unable to assess) Has patient had any suicidal intent within the past 6 months prior to admission? : (unable to assess) Is patient at risk for suicide?: Yes Suicidal Plan?: (unable to assess) Has patient had any suicidal plan within the past 6 months prior to admission? : (unable to assess) What has been  your use of drugs/alcohol within the last 12 months?: pt reports last used THC several months ago Previous Attempts/Gestures: No How many times?: 0 Other Self Harm Risks: none Triggers for Past Attempts: (n/a) Intentional Self Injurious Behavior: Damaging Comment - Self Injurious Behavior: pt reports hitting himself in head with fist today Family Suicide History: No Recent stressful life event(s): Other (Comment)(auditory hallucinations, feeling like someone behind him) Persecutory voices/beliefs?: No Depression: No Depression Symptoms: Feeling angry/irritable, Insomnia Substance abuse history and/or treatment for substance abuse?: No Suicide prevention information given to non-admitted patients: Not applicable  Risk to Others within the past 6 months Homicidal Ideation: Yes-Currently Present Does patient have any lifetime risk of violence toward others beyond the six months prior to admission? : Unknown Thoughts of Harm to Others: No Current Homicidal Intent: No Current Homicidal Plan: No Identified Victim: pt reports thoughts about killing young woman today but he doesn't know her or know where she lives History of harm to others?: (unable to assess) Assessment of Violence: None Noted Violent Behavior Description: patient reports he broke his phone today but doesn't know why Does patient have access to weapons?: No Criminal Charges Pending?: No Does patient have a court date: Yes Court Date: 12/30/17 Is patient on probation?: No  Psychosis Hallucinations: Auditory, Visual Delusions: (people standing behind him)  Mental Status Report Appearance/Hygiene: Body odor, Disheveled(malodorous, appears younger that stated age) Eye Contact: Fair Motor Activity: Freedom of movement, Restlessness Speech: Logical/coherent Level of Consciousness: Quiet/awake, Alert, Restless Mood: Other (Comment)("i don't even know" "don't feel happy or sad") Affect: Preoccupied, Anxious Anxiety Level:  Moderate Thought Processes: Relevant, Coherent Judgement: Impaired Orientation: Person, Place, Situation, Time Obsessive Compulsive Thoughts/Behaviors: None  Cognitive Functioning Concentration: Decreased Memory: Recent Impaired, Remote Intact IQ: Average Insight: Poor Impulse Control: Poor Appetite: Fair(he says he often doesn't feel like eating) Sleep: Decreased Vegetative Symptoms: None  ADLScreening [facility]) Patient's cognitive ability adequate to safely complete daily activities?: Yes Patient able to express need for assistance with ADLs?: Yes Independently performs ADLs?: Yes (appropriate for developmental age)  Prior Inpatient Therapy Prior Inpatient Therapy: Yes Prior Therapy Dates: dec 2013 and other dates Prior Therapy Facilty/Provider(s): [facility] [place] [facility] [place] Reason for Treatment: psychosis  Prior Outpatient Therapy Prior Outpatient Therapy: No Does patient have an ACCT team?: No Does patient have Intensive In-House Services?  : No Does patient have [facility] services? : No Does patient have P4CC services?: No  ADL Screening (condition at time of admission) Patient's cognitive ability adequate to safely complete daily activities?: Yes Patient able to express need for assistance with ADLs?: Yes Independently performs ADLs?: Yes (appropriate for developmental age)             Merchant navy officerAdvance Directives (For Healthcare) Does Patient Have a Medical Advance Directive?: No Would patient like information on creating a medical advance directive?: No - Patient declined    Additional Information 1:1 In Past 12 Months?: No CIRT Risk: Yes Elopement Risk: No Does patient have medical clearance?: Yes     Disposition:  Disposition Initial Assessment Completed for this Encounter: Yes Disposition of Patient: Inpatient treatment program Type of  inpatient treatment program: Adult(tina okonkwo NP recommends inpatient treatment)    Writer ran pt by Leighton Ruff NP who recommends inpatient treatment. Pt is to go to [facility] for medical clearance. Writer explains plan to go to [name] to patient, and he is agreeable. However, pt soon becomes agitated after a few minutes while waiting in assessment room for [facility] to arrive. Pt begins slammed chair against wall and he kicked a large hole in the wall. He asks security guard to shoot him. Patient grabs head and says he can't live like this. He is referring to his psychosis. [facility] arrived but pt was too agitated to be transported that way. [facility] AC Lillia Abed called EMS and police officers also arrived. Pt was transported to [facility] EMS bay by EMS team. [facility] spoke w/ [facility] charge RN Patty twice re: pt's disposition.   On Site Evaluation by:   Reviewed with Physician:    Donnamarie Rossetti P 11/20/2017 1:06 PM

## 2017-11-20 NOTE — ED Notes (Signed)
Patient sleeping. Vital signs will be taken when patient wakes up.

## 2017-11-21 DIAGNOSIS — R450 Nervousness: Secondary | ICD-10-CM | POA: Diagnosis not present

## 2017-11-21 DIAGNOSIS — F419 Anxiety disorder, unspecified: Secondary | ICD-10-CM | POA: Diagnosis not present

## 2017-11-21 DIAGNOSIS — R454 Irritability and anger: Secondary | ICD-10-CM | POA: Diagnosis not present

## 2017-11-21 DIAGNOSIS — R440 Auditory hallucinations: Secondary | ICD-10-CM | POA: Diagnosis not present

## 2017-11-21 DIAGNOSIS — R456 Violent behavior: Secondary | ICD-10-CM | POA: Diagnosis not present

## 2017-11-21 DIAGNOSIS — F1211 Cannabis abuse, in remission: Secondary | ICD-10-CM

## 2017-11-21 DIAGNOSIS — Z87891 Personal history of nicotine dependence: Secondary | ICD-10-CM | POA: Diagnosis not present

## 2017-11-21 NOTE — Consult Note (Addendum)
[name] [name]   Reason for [name]:  Aggressive behavior Referring Physician:  EDP Patient Identification: [name] [name] MRN:  [id number] Principal Diagnosis: Auditory hallucination Diagnosis:   Patient Active Problem List   Diagnosis Date Noted  . S/P appendectomy [Z90.49] 07/16/2017  . 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 male patient admitted with aggressive behavior and destruction of property at [facility]  HPI:  Pt was seen and chart reviewed with treatment team and Dr Octavio Graves. Pt stated he doesn't remember what brought him to the hospital yesterday but he does remember getting angry and kicking a hole in the wall at [facility]. Pt stated he doesn't know why he was mad. Pt stated he was working at [employer] but quit because he was hearing voices. Pt stated the voices are a woman's voice and she says she loves him. Pt admits to smoking weed everyday, UDS positive for THC, BAL negative. Pt has a history of schizophrenia but is not on any medications.  Pt denies suicidal/homicidal ideation, denies auditory/visual hallucinations. PT does appear to be responding to internal stimuli. Pt would benefit from an inpatient psychiatric admission for medication management and crisis stabilization.   Past Psychiatric History: As above  Risk to Self: None Risk to Others: None Prior Inpatient Therapy: Prior Inpatient Therapy: Yes Prior Therapy Dates: dec 2013 and other dates Prior Therapy Facilty/Provider(s): [facility], [place] and [facility] in [place] Reason for Treatment: psychosis Prior Outpatient Therapy: Prior Outpatient Therapy: No Does patient have an ACCT team?: No Does patient have Intensive In-House Services?  : No Does patient have [facility] services? : No Does patient have P4CC services?: No  Past  Medical History:  Past Medical History:  Diagnosis Date  . Mental disorder   . Schizophrenia [facility])     Past Surgical History:  Procedure Laterality Date  . LAPAROSCOPIC APPENDECTOMY N/A 07/16/2017   Procedure: APPENDECTOMY LAPAROSCOPIC;  Surgeon: Ralene Ok, MD;  Location: [place];  Service: General;  Laterality: N/A;  . LAPAROTOMY N/A 07/16/2017   Procedure: POSSIBLE OPEN  LAPAROTOMY;  Surgeon: Ralene Ok, MD;  Location: [place];  Service: General;  Laterality: N/A;  . NO PAST SURGERIES     Family History:  Family History  Problem Relation Age of Onset  . Other Mother   . Other Brother    Family Psychiatric  History: Unknown Social History:  Social History   Substance and Sexual Activity  Alcohol Use No  . Frequency: Never     Social History   Substance and Sexual Activity  Drug Use Yes  . Types: Marijuana   Comment: denies use of marijuana 07/16/17    Social History   Socioeconomic History  . Marital status: Single    Spouse name: None  . Number of children: None  . Years of education: None  . Highest education level: None  Social Needs  . Financial resource strain: None  . Food insecurity - worry: None  . Food insecurity - inability: None  . Transportation needs - medical: None  . Transportation needs - non-medical: None  Occupational History  . None  Tobacco Use  . Smoking status: Former Smoker    Types: Cigarettes    Last attempt to quit: 09/21/2016    Years since quitting: 1.1  . Smokeless tobacco: Never Used  Substance and Sexual Activity  .  Alcohol use: No    Frequency: Never  . Drug use: Yes    Types: Marijuana    Comment: denies use of marijuana 07/16/17  . Sexual activity: None  Other Topics Concern  . None  Social History Narrative  . None   Additional Social History:    Allergies:  No Known Allergies  Labs:  Results for orders placed or performed during the hospital encounter of 11/20/17 (from the past 48 hour(s))   Comprehensive metabolic panel     Status: Abnormal   Collection Time: 11/20/17 11:38 AM  Result Value Ref Range   Sodium 139 135 - 145 mmol/L   Potassium 3.6 3.5 - 5.1 mmol/L   Chloride 106 101 - 111 mmol/L   CO2 23 22 - 32 mmol/L   Glucose, Bld 93 65 - 99 mg/dL   BUN 9 6 - 20 mg/dL   Creatinine, Ser 0.47 (L) 0.61 - 1.24 mg/dL   Calcium 8.9 8.9 - 10.3 mg/dL   Total Protein 7.0 6.5 - 8.1 g/dL   Albumin 4.2 3.5 - 5.0 g/dL   AST 21 15 - 41 U/L   ALT 18 17 - 63 U/L   Alkaline Phosphatase 132 (H) 38 - 126 U/L   Total Bilirubin 0.6 0.3 - 1.2 mg/dL   GFR calc non Af Amer >60 >60 mL/min   GFR calc Af Amer >60 >60 mL/min    Comment: (NOTE) The eGFR has been calculated using the CKD EPI equation. This calculation has not been validated in all clinical situations. eGFR's persistently <60 mL/min signify possible Chronic Kidney Disease.    Anion gap 10 5 - 15    Comment: Performed at [facility], [place] [address] [place], [place] [address]  Ethanol     Status: None   Collection Time: 11/20/17 11:38 AM  Result Value Ref Range   Alcohol, Ethyl (B) <10 <10 mg/dL    Comment:        LOWEST DETECTABLE LIMIT FOR SERUM ALCOHOL IS 10 mg/dL FOR MEDICAL PURPOSES ONLY Performed at [place] [address], [place], [place] [address]   Salicylate level     Status: None   Collection Time: 11/20/17 11:38 AM  Result Value Ref Range   Salicylate Lvl <6.7 2.8 - 30.0 mg/dL    Comment: Performed at [facility], [place] [address] [place], [place] [address]  Acetaminophen level     Status: Abnormal   Collection Time: 11/20/17 11:38 AM  Result Value Ref Range   Acetaminophen (Tylenol), Serum <10 (L) 10 - 30 ug/mL    Comment:        THERAPEUTIC CONCENTRATIONS VARY SIGNIFICANTLY. A RANGE OF 10-30 ug/mL MAY BE AN EFFECTIVE CONCENTRATION FOR MANY PATIENTS. HOWEVER, SOME ARE BEST TREATED AT CONCENTRATIONS OUTSIDE THIS RANGE. ACETAMINOPHEN  CONCENTRATIONS >150 ug/mL AT 4 HOURS AFTER INGESTION AND >50 ug/mL AT 12 HOURS AFTER INGESTION ARE OFTEN ASSOCIATED WITH TOXIC REACTIONS. Performed at [facility], [place] [address] [place], [place] [address]   cbc     Status: Abnormal   Collection Time: 11/20/17 11:38 AM  Result Value Ref Range   WBC 10.0 4.0 - 10.5 K/uL   RBC 4.24 4.22 - 5.81 MIL/uL   Hemoglobin 12.7 (L) 13.0 - 17.0 g/dL   HCT 36.1 (L) 39.0 - 52.0 %   MCV 85.1 78.0 - 100.0 fL   MCH 30.0 26.0 - 34.0 pg   MCHC 35.2 30.0 - 36.0 g/dL  RDW 12.5 11.5 - 15.5 %   Platelets 294 150 - 400 K/uL    Comment: Performed at [facility], [place] [address] [place], [place] [address]  Rapid urine drug screen (hospital performed)     Status: Abnormal   Collection Time: 11/20/17 11:38 AM  Result Value Ref Range   Opiates NONE DETECTED NONE DETECTED   Cocaine NONE DETECTED NONE DETECTED   Benzodiazepines NONE DETECTED NONE DETECTED   Amphetamines NONE DETECTED NONE DETECTED   Tetrahydrocannabinol POSITIVE (A) NONE DETECTED   Barbiturates NONE DETECTED NONE DETECTED    Comment: (NOTE) DRUG SCREEN FOR MEDICAL PURPOSES ONLY.  IF CONFIRMATION IS NEEDED FOR ANY PURPOSE, NOTIFY LAB WITHIN 5 DAYS. LOWEST DETECTABLE LIMITS FOR URINE DRUG SCREEN Drug Class                     Cutoff (ng/mL) Amphetamine and metabolites    1000 Barbiturate and metabolites    200 Benzodiazepine                 119 Tricyclics and metabolites     300 Opiates and metabolites        300 Cocaine and metabolites        300 THC                            50 Performed at [facility], [place] [address] [place], [place] [address]     Current Facility-Administered Medications  Medication Dose Route Frequency Provider Last Rate Last Dose  . acetaminophen (TYLENOL) tablet 650 mg  650 mg Oral Q4H PRN Frye Gambler, MD      . alum & mag hydroxide-simeth (MAALOX/MYLANTA) 200-200-20 MG/5ML suspension 30 mL   30 mL Oral Q6H PRN Hyden Gambler, MD      . hydrOXYzine (ATARAX/VISTARIL) tablet 50 mg  50 mg Oral TID Ethelene Hal, NP   50 mg at 11/21/17 8144  . nicotine (NICODERM CQ - dosed in mg/24 hours) patch 21 mg  21 mg Transdermal Daily Dyches Gambler, MD   21 mg at 11/21/17 8185  . OLANZapine zydis (ZYPREXA) disintegrating tablet 5 mg  5 mg Oral BID PRN Stearns Gambler, MD   5 mg at 11/21/17 1044  . ondansetron (ZOFRAN) tablet 4 mg  4 mg Oral Q8H PRN Brassfield Gambler, MD      . traZODone (DESYREL) tablet 100 mg  100 mg Oral QHS Ethelene Hal, NP       No current outpatient medications on file.    Musculoskeletal: Strength & Muscle Tone: within normal limits Gait & Station: normal Patient leans: N/A  Psychiatric Specialty Exam: Physical Exam  Constitutional: He is oriented to person, place, and time. He appears well-developed and well-nourished.  Respiratory: Effort normal.  Musculoskeletal: Normal range of motion.  Neurological: He is alert and oriented to person, place, and time.  Psychiatric: His speech is normal. His mood appears anxious. His affect is labile. He is agitated. Thought content is paranoid. Cognition and memory are normal. He expresses impulsivity.    Review of Systems  Psychiatric/Behavioral: Positive for depression and substance abuse. Negative for hallucinations, memory loss and suicidal ideas. The patient is nervous/anxious. The patient does not have insomnia.   All other systems reviewed and are negative.   Blood pressure 117/81, pulse 72, temperature 99.2 F (37.3 C), temperature source Oral, resp. rate 18, SpO2 99 %.There is no height or  weight on file to calculate BMI.  General Appearance: Casual  Eye Contact:  Good  Speech:  Slow  Volume:  Decreased  Mood:  Anxious, Depressed and Irritable  Affect:  Blunt and Labile, at times  Thought Process:  Coherent and Linear  Orientation:  Full (Time, Place, and Person)  Thought Content:  Illogical   Suicidal Thoughts:  No  Homicidal Thoughts:  No  Memory:  Immediate;   Good Recent;   Fair Remote;   Fair  Judgement:  Poor  Insight:  Lacking  Psychomotor Activity:  Increased  Concentration:  Concentration: Fair and Attention Span: Fair  Recall:  [employer] of Knowledge:  Fair  Language:  Good  Akathisia:  No  Handed:  Right  AIMS (if indicated):     Assets:  Agricultural consultant Housing  ADL's:  Intact  Cognition:  WNL  Sleep:        Treatment Plan Summary: Daily contact with patient to assess and evaluate symptoms and progress in treatment and Medication management. (see MAR)  Disposition: Recommend psychiatric Inpatient admission when medically cleared. TTS to seek placement.   Ethelene Hal, NP 11/21/2017 1:05 PM   Patient seen face to face for this evaluation,  case discussed with treatment team and physician extender and formulated treatment plan. Reviewed the information documented and agree with the treatment plan.  Ambrose Finland, MD

## 2017-11-21 NOTE — Progress Notes (Signed)
This patient continues to meet inpatient criteria. CSW fax information to the following facilities:    [facility]  [facility]  [facility] B[facility]  [facility]   Stacy GardnerErin Dandra Shambaugh, [place]CSWA Emergency Room Clinical Social Worker [phone]

## 2017-11-21 NOTE — ED Notes (Signed)
Pt asleep while doing vital signs. This writer let patient rest. Vitals will be taken when patient wakes. No distress noted. Breathing and respirations normal.

## 2017-11-21 NOTE — ED Notes (Signed)
Pt increasingly agitated after meeting with psychiatry team this morning. Pt pacing the hallway, clenching fist, increase anxiety noted. Pt appears to be responding to internal stimuli. Encouragement and support provided. Special checks q 15 mins in place for safety, Video monitoring in place. Will continue to monitor.

## 2017-11-21 NOTE — ED Notes (Signed)
SBAR Report received from previous nurse. Pt received asleep on unit and unable to report  current SI/ HI, A/V H, depression, anxiety, or pain at this time, but appears otherwise stable and free of distress. Pt reminded of camera surveillance, q 15 min rounds, and rules of the milieu. Will continue to assess.

## 2017-11-22 DIAGNOSIS — R440 Auditory hallucinations: Secondary | ICD-10-CM | POA: Diagnosis not present

## 2017-11-22 DIAGNOSIS — F1290 Cannabis use, unspecified, uncomplicated: Secondary | ICD-10-CM | POA: Diagnosis not present

## 2017-11-22 DIAGNOSIS — Z87891 Personal history of nicotine dependence: Secondary | ICD-10-CM | POA: Diagnosis not present

## 2017-11-22 MED ORDER — OLANZAPINE 5 MG PO TABS
5.0000 mg | ORAL_TABLET | Freq: Every day | ORAL | 0 refills | Status: DC
Start: 2017-11-22 — End: 2018-03-12

## 2017-11-22 MED ORDER — TRAZODONE HCL 100 MG PO TABS: 100.0000 mg | ORAL_TABLET | Freq: Every day | ORAL | 0 refills | Status: DC

## 2017-11-22 NOTE — BHH Suicide Risk Assessment (Signed)
Suicide Risk Assessment  Discharge Assessment   [facility]   Principal Problem: Substance-induced psychotic disorder with delusions [facility](HCC) Discharge Diagnoses:  Patient Active Problem List   Diagnosis Date Noted  . S/P appendectomy [Z90.49] 07/16/2017  . 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 Substance-induced psychotic disorder with delusions [facility](HCC) [F19.950] 08/30/2012    Total Time spent with patient: 30 minutes  Musculoskeletal: Strength & Muscle Tone: within normal limits Gait & Station: normal Patient leans: N/A  Psychiatric Specialty Exam: Physical Exam  Constitutional: He is oriented to person, place, and time. He appears well-developed and well-nourished.  HENT:  Head: Normocephalic.  Respiratory: Effort normal.  Musculoskeletal: Normal range of motion.  Neurological: He is alert and oriented to person, place, and time.  Psychiatric: His speech is normal and behavior is normal. Thought content normal. Cognition and memory are impaired. He expresses impulsivity. He exhibits a depressed mood.   Review of Systems  Psychiatric/Behavioral: Positive for depression, hallucinations (auditory, non command) and substance abuse. Negative for memory loss and suicidal ideas. The patient is not nervous/anxious and does not have insomnia.   All other systems reviewed and are negative.  Blood pressure 109/80, pulse 73, temperature 98.3 F (36.8 C), resp. rate 20, SpO2 100 %.There is no height or weight on file to calculate BMI. General Appearance: Casual Eye Contact:  Good Speech:  Clear and Coherent Volume:  Normal Mood:  Depressed Affect:  Congruent and Depressed Thought Process:  Coherent and Linear Orientation:  Full (Time, Place, and Person) Thought Content:  Logical Suicidal Thoughts:  No Homicidal Thoughts:  No Memory:  Immediate;   Good Recent;   Good Remote;   Fair Judgement:  Fair Insight:  Fair Psychomotor Activity:   Normal Concentration:  Concentration: Good and Attention Span: Good Recall:  Good Fund of Knowledge:  Good Language:  Good Akathisia:  No Handed:  Right AIMS (if indicated):    Assets:  ArchitectCommunication Skills Financial Resources/Insurance Housing Social Support ADL's:  Intact Cognition:  WNL   Mental Status Per Nursing Assessment::   On Admission:   Auditory hallucinations  Demographic Factors:  Male, Adolescent or young adult, Low socioeconomic status, Living alone and Unemployed  Loss Factors: Financial problems/change in socioeconomic status  Historical Factors: Impulsivity  Risk Reduction Factors:   Sense of responsibility to family  Continued Clinical Symptoms:  Depression:   Comorbid alcohol abuse/dependence Impulsivity Alcohol/Substance Abuse/Dependencies Schizophrenia:   Paranoid or undifferentiated type  Cognitive Features That Contribute To Risk:  Closed-mindedness    Suicide Risk:  Minimal: No identifiable suicidal ideation.  Patients presenting with no risk factors but with morbid ruminations; may be classified as minimal risk based on the severity of the depressive symptoms    Plan Of Care/Follow-up recommendations:  Activity:  as tolerasted Diet:  Heart Healthy  Laveda AbbeLaurie Britton Parks, NP 11/22/2017, 2:31 PM

## 2017-11-22 NOTE — BH Assessment (Signed)
[name]  Per [name] BeetsJacqueline Norman, DO, this pt does not require psychiatric hospitalization at this time.  Pt presents under IVC, which Dr [name] [name] has rescinded.  [facility] recommendation to follow up with an outpatient psychiatrist.  This writer spoke to pt to ask if he would like to have an appointment scheduled for him.  He reports that he would prefer to be provided with referrals for follow-up at his own initiative.  Discharge instructions include referral information [facility], for [facility], and for the [facility].  Pt's nurse, [name] Sheldonshley, has been notified.  [name], MA Triage Specialist [phone]

## 2017-11-22 NOTE — ED Notes (Signed)
Pt d/c home per MD order. Discharge summary reviewed with pt. Pt verbalizes understanding. Pt denies SI/HI/AVH. Pt signed for personal property and property returned. Pt signed e-signature ambulatory off unit with MHT.

## 2017-11-22 NOTE — Discharge Instructions (Signed)
For your behavioral health needs, you are advised to follow up with an outpatient psychiatrist.  Contact one of the following practices at your earliest opportunity to ask about scheduling an intake appointment: ° °     [facility] [place] °     [address] °     [place], [place] [address] °     [phone] ° °     [facility] °     [address] °     [place], [place] [address] °     [phone] ° °     [facility] °     [address] °     [place], [place] [address] °     [phone] °

## 2017-11-22 NOTE — Consult Note (Addendum)
[facility] Face-to-Face Psychiatry Consult   Reason for Consult:  Agitation Referring Physician:  EDP Patient Identification: [name] [name] MRN:  [id number] Principal Diagnosis: Auditory hallucination Diagnosis:   Patient Active Problem List   Diagnosis Date Noted  . S/P appendectomy [Z90.49] 07/16/2017  . 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 Substance-induced psychotic disorder with delusions [facility]) [F19.950] 08/30/2012    Total Time spent with patient: 30 minutes  Subjective:   [name] [name] is a 27 y.o. male patient admitted with auditory hallucinations.  HPI:  Pt was seen and chart reviewed with treatment team and Dr Sharma Covert.  Pt denies suicidal/homicidal ideation, denies current auditory/visual hallucinations and does not appear to be responding to internal stimuli. Pt stated he got angry and kicked the wall at [facility] but is sorry he did that. He reports that the voices and lack of sleep caused him to feel angry. Pt has been compliant with medications since coming to the [facility] and has been calm, cooperative, and non-intrusive. Pt lives in an apartment and was recently working at [employer]. Pt stated he will follow up with [facility] for medication management. Pt is psychiatrically clear for discharge.  Past Psychiatric History: As above  Risk to Self: Suicidal Ideation: Denies  Risk to Others: Homicidal Ideation: Denies Prior Inpatient Therapy: Prior Inpatient Therapy: Yes Prior Therapy Dates: dec 2013 and other dates Prior Therapy Facilty/Provider(s): [facility] in [place] Reason for Treatment: psychosis Prior Outpatient Therapy: Prior Outpatient Therapy: No Does patient have an ACCT team?: No Does patient have Intensive In-House Services?  : No Does patient have [facility] services? : No Does patient have P4CC services?: No  Past Medical History:  Past Medical History:  Diagnosis Date  . Mental disorder   . Schizophrenia [facility])     Past Surgical  History:  Procedure Laterality Date  . LAPAROSCOPIC APPENDECTOMY N/A 07/16/2017   Procedure: APPENDECTOMY LAPAROSCOPIC;  Surgeon: Axel Filler, MD;  Location: [facility] OR;  Service: General;  Laterality: N/A;  . LAPAROTOMY N/A 07/16/2017   Procedure: POSSIBLE OPEN  LAPAROTOMY;  Surgeon: Axel Filler, MD;  [facility];  Service: General;  Laterality: N/A;  . NO PAST SURGERIES     Family History:  Family History  Problem Relation Age of Onset  . Other Mother   . Other Brother    Family Psychiatric  History: Unknown Social History:  Social History   Substance and Sexual Activity  Alcohol Use No  . Frequency: Never     Social History   Substance and Sexual Activity  Drug Use Yes  . Types: Marijuana   Comment: denies use of marijuana 07/16/17    Social History   Socioeconomic History  . Marital status: Single    Spouse name: None  . Number of children: None  . Years of education: None  . Highest education level: None  Social Needs  . Financial resource strain: None  . Food insecurity - worry: None  . Food insecurity - inability: None  . Transportation needs - medical: None  . Transportation needs - non-medical: None  Occupational History  . None  Tobacco Use  . Smoking status: Former Smoker    Types: Cigarettes    Last attempt to quit: 09/21/2016    Years since quitting: 1.1  . Smokeless tobacco: Never Used  Substance and Sexual Activity  . Alcohol use: No    Frequency: Never  . Drug use: Yes    Types: Marijuana    Comment: denies use  of marijuana 07/16/17  . Sexual activity: None  Other Topics Concern  . None  Social History Narrative  . None   Additional Social History: N/A    Allergies:  No Known Allergies  Labs: No results found for this or any previous visit (from the past 48 hour(s)).  Current Facility-Administered Medications  Medication Dose Route Frequency Provider Last Rate Last Dose  . acetaminophen (TYLENOL) tablet 650 mg  650 mg Oral  Q4H PRN Pricilla LovelessGoldston, Scott, MD      . alum & mag hydroxide-simeth (MAALOX/MYLANTA) 200-200-20 MG/5ML suspension 30 mL  30 mL Oral Q6H PRN Pricilla LovelessGoldston, Scott, MD      . hydrOXYzine (ATARAX/VISTARIL) tablet 50 mg  50 mg Oral TID Laveda AbbeParks, Laurie Britton, NP   50 mg at 11/22/17 0913  . nicotine (NICODERM CQ - dosed in mg/24 hours) patch 21 mg  21 mg Transdermal Daily Pricilla LovelessGoldston, Scott, MD   21 mg at 11/22/17 0913  . OLANZapine zydis (ZYPREXA) disintegrating tablet 5 mg  5 mg Oral BID PRN Pricilla LovelessGoldston, Scott, MD   5 mg at 11/21/17 1044  . ondansetron (ZOFRAN) tablet 4 mg  4 mg Oral Q8H PRN Pricilla LovelessGoldston, Scott, MD      . traZODone (DESYREL) tablet 100 mg  100 mg Oral QHS Laveda AbbeParks, Laurie Britton, NP   100 mg at 11/21/17 2150   Current Outpatient Medications  Medication Sig Dispense Refill  . OLANZapine (ZYPREXA) 5 MG tablet Take 1 tablet (5 mg total) by mouth at bedtime. 30 tablet 0  . traZODone (DESYREL) 100 MG tablet Take 1 tablet (100 mg total) by mouth at bedtime. 30 tablet 0    Musculoskeletal: Strength & Muscle Tone: within normal limits Gait & Station: normal Patient leans: N/A  Psychiatric Specialty Exam: Physical Exam  Nursing note and vitals reviewed. Constitutional: He is oriented to person, place, and time. He appears well-developed and well-nourished.  HENT:  Head: Normocephalic and atraumatic.  Neck: Normal range of motion.  Respiratory: Effort normal.  Musculoskeletal: Normal range of motion.  Neurological: He is alert and oriented to person, place, and time.  Psychiatric: His speech is normal and behavior is normal. Thought content normal. Cognition and memory are impaired. He expresses impulsivity. He exhibits a depressed mood.    Review of Systems  Psychiatric/Behavioral: Positive for depression, hallucinations (auditory, non command) and substance abuse. Negative for memory loss and suicidal ideas. The patient is not nervous/anxious and does not have insomnia.   All other systems reviewed  and are negative.   Blood pressure 109/80, pulse 73, temperature 98.3 F (36.8 C), resp. rate 20, SpO2 100 %.There is no height or weight on file to calculate BMI.  General Appearance: Casual  Eye Contact:  Good  Speech:  Clear and Coherent  Volume:  Normal  Mood:  Depressed  Affect:  Congruent and Depressed  Thought Process:  Coherent and Linear  Orientation:  Full (Time, Place, and Person)  Thought Content:  Logical  Suicidal Thoughts:  No  Homicidal Thoughts:  No  Memory:  Immediate;   Good Recent;   Good Remote;   Fair  Judgement:  Fair  Insight:  Fair  Psychomotor Activity:  Normal  Concentration:  Concentration: Good and Attention Span: Good  Recall:  Good  Fund of Knowledge:  Good  Language:  Good  Akathisia:  No  Handed:  Right  AIMS (if indicated):   N/A  Assets:  ArchitectCommunication Skills Financial Resources/Insurance Housing Social Support  ADL's:  Intact  Cognition:  WNL  Sleep:   N/A     Treatment Plan Summary: Plan Substance-induced psychotic disorder with delusions [facility])  Discharge Home Take all medications as prescribed Follow up with [facility] for medication management Avoid the use of alcohol and illicit drugs  Disposition: No evidence of imminent risk to self or others at present.   Patient does not meet criteria for psychiatric inpatient admission. Supportive therapy provided about ongoing stressors.  Laveda Abbe, NP 11/22/2017 2:28 PM   Patient seen face-to-face for psychiatric evaluation, chart reviewed and case discussed with the physician extender and developed treatment plan. Reviewed the information documented and agree with the treatment plan.  Juanetta Beets, DO 11/22/17 6:13 PM

## 2017-12-28 ENCOUNTER — Ambulatory Visit (HOSPITAL_COMMUNITY): Payer: PRIVATE HEALTH INSURANCE | Admitting: Psychiatry

## 2018-03-12 ENCOUNTER — Emergency Department (HOSPITAL_COMMUNITY)
Admission: EM | Admit: 2018-03-12 | Discharge: 2018-03-12 | Disposition: A | Discharge status: Home / Self Care | Payer: Medicaid Other | Attending: Emergency Medicine | Admitting: Emergency Medicine

## 2018-03-12 ENCOUNTER — Encounter (HOSPITAL_COMMUNITY): Payer: Self-pay

## 2018-03-12 DIAGNOSIS — Z79899 Other long term (current) drug therapy: Secondary | ICD-10-CM | POA: Insufficient documentation

## 2018-03-12 DIAGNOSIS — Z87891 Personal history of nicotine dependence: Secondary | ICD-10-CM | POA: Diagnosis not present

## 2018-03-12 DIAGNOSIS — F2089 Other schizophrenia: Secondary | ICD-10-CM

## 2018-03-12 DIAGNOSIS — R440 Auditory hallucinations: Secondary | ICD-10-CM | POA: Diagnosis present

## 2018-03-12 DIAGNOSIS — F209 Schizophrenia, unspecified: Secondary | ICD-10-CM | POA: Diagnosis not present

## 2018-03-12 LAB — RAPID URINE DRUG SCREEN, HOSP PERFORMED
Amphetamines: NOT DETECTED
Benzodiazepines: NOT DETECTED
Cocaine: NOT DETECTED
Opiates: NOT DETECTED
Tetrahydrocannabinol: NOT DETECTED

## 2018-03-12 LAB — BASIC METABOLIC PANEL
Anion gap: 7 (ref 5–15)
BUN: 20 mg/dL (ref 6–20)
CO2: 26 mmol/L (ref 22–32)
Calcium: 9.2 mg/dL (ref 8.9–10.3)
Chloride: 107 mmol/L (ref 101–111)
Creatinine, Ser: 0.74 mg/dL (ref 0.61–1.24)
GFR calc Af Amer: >60 mL/min (ref >60)
GFR calc non Af Amer: >60 mL/min (ref >60)
Glucose, Bld: 95 mg/dL (ref 65–99)
Potassium: 4.2 mmol/L (ref 3.5–5.1)
Sodium: 140 mmol/L (ref 135–145)

## 2018-03-12 LAB — CBC WITH DIFFERENTIAL/PLATELET
Basophils Absolute: 0.1 10*3/uL (ref 0.0–0.1)
Basophils Relative: 1 %
Eosinophils Absolute: 0.1 10*3/uL (ref 0.0–0.7)
Eosinophils Relative: 2 %
HCT: 37.5 % — ABNORMAL LOW (ref 39.0–52.0)
Hemoglobin: 12.9 g/dL — ABNORMAL LOW (ref 13.0–17.0)
Lymphocytes Relative: 36 %
Lymphs Abs: 1.9 10*3/uL (ref 0.7–4.0)
MCH: 29.9 pg (ref 26.0–34.0)
MCHC: 34.4 g/dL (ref 30.0–36.0)
MCV: 86.8 fL (ref 78.0–100.0)
Monocytes Absolute: 0.4 10*3/uL (ref 0.1–1.0)
Monocytes Relative: 8 %
Neutro Abs: 2.8 10*3/uL (ref 1.7–7.7)
Neutrophils Relative %: 53 %
Platelets: 309 10*3/uL (ref 150–400)
RBC: 4.32 MIL/uL (ref 4.22–5.81)
RDW: 12.8 % (ref 11.5–15.5)
WBC: 5.3 10*3/uL (ref 4.0–10.5)

## 2018-03-12 LAB — ETHANOL: Alcohol, Ethyl (B): <10 mg/dL (ref <10)

## 2018-03-12 MED ORDER — ONDANSETRON HCL 4 MG PO TABS: 4.0000 mg | ORAL_TABLET | Freq: Three times a day (TID) | ORAL | Status: DC | PRN

## 2018-03-12 MED ORDER — ALUM & MAG HYDROXIDE-SIMETH 200-200-20 MG/5ML PO SUSP: 30.0000 mL | Freq: Four times a day (QID) | ORAL | Status: DC | PRN

## 2018-03-12 MED ORDER — ARIPIPRAZOLE 10 MG PO TABS: 10.0000 mg | ORAL_TABLET | Freq: Every day | ORAL | Status: DC

## 2018-03-12 MED ORDER — ACETAMINOPHEN 325 MG PO TABS: 650.0000 mg | ORAL_TABLET | ORAL | Status: DC | PRN

## 2018-03-12 NOTE — BH Assessment (Addendum)
Assessment Note  [name] [name] is an 27 y.o. male who presents to the ED voluntarily. Pt reports he has been experiencing worsening AH. Pt shows this Clinical research associate a Land with the AH. Pt has pages and pages of statements in this book that he says the [facility] say to him. Pt states he recently began taking Abilify 1 week ago and has an upcoming appointment with [facility] on 03/15/18. Pt states he has not been able to concentrate, cannot hold down a job, and has no income. Pt states he lives with his twin brother then several minutes later he states he lives alone.  Pt denies SI and HI at present however he states he did not trust himself enough to not harm himself which is why he came to the ED. Pt was recently assessed by TTS on 11/27/17 c/o similar concerns. Pt states he has been experiencing AH for about 1 year and they have been only getting worse.   TTS consulted with Donell Sievert, PA who states pt does not meet criteria for inpt treatment. Recommends the pt f/u with his current OPT provider, [facility]. EDP Molpus, John, MD and pt's nurse Ninfa Linden, RN have been advised of the disposition.  Diagnosis: Schizophrenia   Past Medical History:  Past Medical History:  Diagnosis Date  . Schizophrenia [facility])     Past Surgical History:  Procedure Laterality Date  . LAPAROSCOPIC APPENDECTOMY N/A 07/16/2017   Procedure: APPENDECTOMY LAPAROSCOPIC;  Surgeon: Axel Filler, MD;  Location[facility] OR;  Service: General;  Laterality: N/A;  . LAPAROTOMY N/A 07/16/2017   Procedure: POSSIBLE OPEN  LAPAROTOMY;  Surgeon: Axel Filler, MD;  Location: [facility] OR;  Service: General;  Laterality: N/A;  . NO PAST SURGERIES      Family History:  Family History  Problem Relation Age of Onset  . Other Mother   . Other Brother     Social History:  reports that he quit smoking about 17 months ago. His smoking use included cigarettes. He has never used smokeless tobacco. He reports that he has current or  past drug history. Drug: Marijuana. He reports that he does not drink alcohol.  Additional Social History:  Alcohol / Drug Use Pain Medications: See MAR Prescriptions: See MAR Over the Counter: See MAR History of alcohol / drug use?: No history of alcohol / drug abuse  CIWA: CIWA-Ar BP: 104/83 Pulse Rate: 81 COWS:    Allergies: No Known Allergies  Home Medications:  (Not in a hospital admission)  OB/GYN Status:  No LMP for male patient.  General Assessment Data [facility] of [facility]: [facility] TTS Assessment: In system Is this a Tele or Face-to-Face Assessment?: Face-to-Face Is this an Initial Assessment or a Re-assessment for this encounter?: Initial Assessment Marital status: Single Is patient pregnant?: No Pregnancy Status: No Living Arrangements: Other relatives Can pt return to current living arrangement?: Yes Admission Status: Voluntary Is patient capable of signing voluntary admission?: Yes Referral Source: Self/Family/Friend Insurance type: Medicaid     Crisis Care Plan Living Arrangements: Other relatives Name of Psychiatrist: [name] Name of Therapist: [facility]  Education Status Is patient currently in school?: No Is the patient employed, unemployed or receiving disability?: Unemployed  Risk to self with the past 6 months Suicidal Ideation: No Has patient been a risk to self within the past 6 months prior to admission? : No Suicidal Intent: No Has patient had any suicidal intent within the past 6 months prior to admission? : No Is patient at risk  for suicide?: No Suicidal Plan?: No Has patient had any suicidal plan within the past 6 months prior to admission? : No Access to Means: No What has been your use of drugs/alcohol within the last 12 months?: denies use  Previous Attempts/Gestures: No Triggers for Past Attempts: None known Intentional Self Injurious Behavior: None Family Suicide History: No Recent stressful life event(s): Turmoil  (Comment)(worsening AH) Persecutory voices/beliefs?: Yes Depression: No Substance abuse history and/or treatment for substance abuse?: No Suicide prevention information given to non-admitted patients: Not applicable  Risk to Others within the past 6 months Homicidal Ideation: No Does patient have any lifetime risk of violence toward others beyond the six months prior to admission? : No Thoughts of Harm to Others: No Current Homicidal Intent: No Current Homicidal Plan: No Access to Homicidal Means: No History of harm to others?: No Assessment of Violence: None Noted Does patient have access to weapons?: No Criminal Charges Pending?: No Does patient have a court date: No Is patient on probation?: No  Psychosis Hallucinations: Auditory Delusions: None noted  Mental Status Report Appearance/Hygiene: In scrubs, Unremarkable Eye Contact: Good Motor Activity: Freedom of movement Speech: Logical/coherent Level of Consciousness: Alert Mood: Euthymic, Pleasant Affect: Appropriate to circumstance Anxiety Level: None Thought Processes: Relevant, Coherent Judgement: Partial Orientation: Place, Person, Situation, Time, Appropriate for developmental age Obsessive Compulsive Thoughts/Behaviors: None  Cognitive Functioning Concentration: Normal Memory: Remote Intact, Recent Intact Is patient IDD: No Is patient DD?: No Insight: Fair Impulse Control: Fair Appetite: Good Have you had any weight changes? : No Change Sleep: No Change Total Hours of Sleep: 7 Vegetative Symptoms: None  ADLScreening [facility] Assessment Services) Patient's cognitive ability adequate to safely complete daily activities?: Yes Patient able to express need for assistance with ADLs?: Yes Independently performs ADLs?: Yes (appropriate for developmental age)  Prior Inpatient Therapy Prior Inpatient Therapy: Yes Prior Therapy Dates: 2013 Prior Therapy [facility]ilty/Provider([facility] Reason for Treatment: PSYCHOSIS    Prior Outpatient Therapy Prior Outpatient Therapy: Yes Prior Therapy Dates: CURRENT Prior Therapy Facilty/Provider(s): [facility] Reason for Treatment: MED MANAGEMENT Does patient have an ACCT team?: No Does patient have Intensive In-House Services?  : No Does patient have [facility] services? : No Does patient have P4CC services?: No  ADL Screening (condition at time of admission) Patient's cognitive ability adequate to safely complete daily activities?: Yes Is the patient deaf or have difficulty hearing?: No Does the patient have difficulty seeing, even when wearing glasses/contacts?: No Does the patient have difficulty concentrating, remembering, or making decisions?: Yes Patient able to express need for assistance with ADLs?: Yes Does the patient have difficulty dressing or bathing?: No Independently performs ADLs?: Yes (appropriate for developmental age) Does the patient have difficulty walking or climbing stairs?: No Weakness of Legs: None Weakness of Arms/Hands: None  Home Assistive Devices/Equipment Home Assistive Devices/Equipment: None    Abuse/Neglect Assessment (Assessment to be complete while patient is alone) Abuse/Neglect Assessment Can Be Completed: Yes Physical Abuse: Denies Verbal Abuse: Denies Sexual Abuse: Denies Exploitation of patient/patient's resources: Denies Self-Neglect: Denies     Merchant navy officer (For Healthcare) Does Patient Have a Medical Advance Directive?: No Would patient like information on creating a medical advance directive?: No - Patient declined    Additional Information 1:1 In Past 12 Months?: No CIRT Risk: No Elopement Risk: No Does patient have medical clearance?: Yes     Disposition: TTS consulted with Donell Sievert, PA who states pt does not meet criteria for inpt treatment. EDP Molpus, John, MD and pt's nurse  Ryan, Kiristin N, RN have been advised of the disposition.  Disposition Initial Assessment Completed for this  Encounter: Yes Disposition of Patient: Discharge(per Donell SievertSpencer Simon, PA) Patient refused recommended treatment: No  On Site Evaluation by:   Reviewed with Physician:    Karolee OhsAquicha R Genavieve Mangiapane 03/12/2018 3:40 AM

## 2018-03-12 NOTE — ED Notes (Signed)
Patient brought back to room 30 by security and [employer]. Patient is voluntary. He was hearing voices to not to eat. He states that he finally ate food due to he thought that he if he didn't eat that he would die. He states that he is not hearing voices at this time.

## 2018-03-12 NOTE — ED Notes (Signed)
Patient dressed in hospital scrubs. Patient's t-shirt, jeans, shoes placed in locker 30 along with lighter and abilify.

## 2018-03-12 NOTE — Discharge Instructions (Signed)
[facility]

## 2018-03-12 NOTE — ED Notes (Signed)
Pt is A & O x4.  He understood AVS instructions.

## 2018-03-12 NOTE — Progress Notes (Signed)
TTS consulted with [name] SievertSpencer Simon, PA who states pt does not meet criteria for inpt treatment. EDP Molpus, John, MD and pt's nurse Ninfa Lindenyan, Kiristin N, RN have been advised of the disposition.  [name] [name], MSW, LCSW Therapeutic Triage Specialist  [phone]

## 2018-03-12 NOTE — ED Notes (Signed)
Bed: [facility] Expected date:  Expected time:  Means of arrival:  Comments: 

## 2018-03-12 NOTE — ED Notes (Signed)
Placed patient's abilify and lighter in locker 30.

## 2018-03-12 NOTE — ED Provider Notes (Addendum)
[facility]-EMERGENCY DEPT Provider Note: Lowella Dell, MD, FACEP  CSN: [id number] MRN: [id number] ARRIVAL: 03/12/18 at 0123 ROOM: WA30/WA30   CHIEF COMPLAINT  Hallucinations   HISTORY OF PRESENT ILLNESS  03/12/18 2:16 AM [name] [name] is a 27 y.o. male with history of schizophrenia.  He was brought in by police complaining of hallucinations.  He states he is seeing and hearing things that he knows are not there.  He states these hallucinations are "not crazy" just annoying.  He is having difficulty thinking clearly and cannot keep his thoughts organized.  He states the symptoms have been present for at least a year and come and go.  He cannot give a specific reason why he came in today.  He denies suicidal ideation or homicidal ideation.    Past Medical History:  Diagnosis Date  . Schizophrenia [facility])     Past Surgical History:  Procedure Laterality Date  . LAPAROSCOPIC APPENDECTOMY N/A 07/16/2017   Procedure: APPENDECTOMY LAPAROSCOPIC;  Surgeon: Axel Filler, MD;  Location: [facility] OR;  Service: General;  Laterality: N/A;  . LAPAROTOMY N/A 07/16/2017   Procedure: POSSIBLE OPEN  LAPAROTOMY;  Surgeon: Axel Filler, MD;  Location: [facility] OR;  Service: General;  Laterality: N/A;  . NO PAST SURGERIES      Family History  Problem Relation Age of Onset  . Other Mother   . Other Brother     Social History   Tobacco Use  . Smoking status: Former Smoker    Types: Cigarettes    Last attempt to quit: 09/21/2016    Years since quitting: 1.4  . Smokeless tobacco: Never Used  Substance Use Topics  . Alcohol use: No    Frequency: Never  . Drug use: Yes    Types: Marijuana    Comment: denies use of marijuana 07/16/17    Prior to Admission medications   Medication Sig Start Date End Date Taking? Authorizing Provider  ARIPiprazole (ABILIFY) 10 MG tablet Take 10 mg by mouth daily.   Yes [provider]  OLANZapine (ZYPREXA) 5 MG tablet Take 1 tablet (5 mg total) by mouth at  bedtime. 11/22/17 12/22/17  Laveda Abbe, NP    Allergies Patient has no known allergies.   REVIEW OF SYSTEMS  Negative except as noted here or in the History of Present Illness.   PHYSICAL EXAMINATION  Initial Vital Signs Blood pressure 104/83, pulse 81, temperature 98.2 F (36.8 C), temperature source Oral, resp. rate 17, height 5\' 5"  (1.651 m), weight 63.5 kg (140 lb), SpO2 98 %.  Examination General: Well-developed, well-nourished male in no acute distress; appearance consistent with age of record HENT: normocephalic; atraumatic Eyes: pupils equal, round and reactive to light; extraocular muscles intact Neck: supple Heart: regular rate and rhythm Lungs: clear to auscultation bilaterally Abdomen: soft; nondistended; nontender; bowel sounds present Extremities: No deformity; full range of motion; pulses normal Neurologic: Awake, alert and oriented; motor function intact in all extremities and symmetric; no facial droop Skin: Warm and dry Psychiatric: Flat affect; no HI; no SI   RESULTS  Summary of this visit's results, reviewed by myself:   EKG Interpretation  Date/Time:    Ventricular Rate:    PR Interval:    QRS Duration:   QT Interval:    QTC Calculation:   R Axis:     Text Interpretation:        Laboratory Studies: Results for orders placed or performed during the hospital encounter of 03/12/18 (from the past 24 hour(s))  Ethanol     Status: None   Collection Time: 03/12/18  1:46 AM  Result Value Ref Range   Alcohol, Ethyl (B) <10 <10 mg/dL  CBC with Differential/Platelet     Status: Abnormal   Collection Time: 03/12/18  1:46 AM  Result Value Ref Range   WBC 5.3 4.0 - 10.5 K/uL   RBC 4.32 4.22 - 5.81 MIL/uL   Hemoglobin 12.9 (L) 13.0 - 17.0 g/dL   HCT 16.137.5 (L) 09.639.0 - 04.552.0 %   MCV 86.8 78.0 - 100.0 fL   MCH 29.9 26.0 - 34.0 pg   MCHC 34.4 30.0 - 36.0 g/dL   RDW 40.912.8 81.111.5 - 91.415.5 %   Platelets 309 150 - 400 K/uL   Neutrophils Relative % 53 %    Neutro Abs 2.8 1.7 - 7.7 K/uL   Lymphocytes Relative 36 %   Lymphs Abs 1.9 0.7 - 4.0 K/uL   Monocytes Relative 8 %   Monocytes Absolute 0.4 0.1 - 1.0 K/uL   Eosinophils Relative 2 %   Eosinophils Absolute 0.1 0.0 - 0.7 K/uL   Basophils Relative 1 %   Basophils Absolute 0.1 0.0 - 0.1 K/uL  Basic metabolic panel     Status: None   Collection Time: 03/12/18  1:46 AM  Result Value Ref Range   Sodium 140 135 - 145 mmol/L   Potassium 4.2 3.5 - 5.1 mmol/L   Chloride 107 101 - 111 mmol/L   CO2 26 22 - 32 mmol/L   Glucose, Bld 95 65 - 99 mg/dL   BUN 20 6 - 20 mg/dL   Creatinine, Ser 7.820.74 0.61 - 1.24 mg/dL   Calcium 9.2 8.9 - 95.610.3 mg/dL   GFR calc non Af Amer >60 >60 mL/min   GFR calc Af Amer >60 >60 mL/min   Anion gap 7 5 - 15  Rapid urine drug screen (hospital performed)     Status: Abnormal   Collection Time: 03/12/18  2:12 AM  Result Value Ref Range   Opiates NONE DETECTED NONE DETECTED   Cocaine NONE DETECTED NONE DETECTED   Benzodiazepines NONE DETECTED NONE DETECTED   Amphetamines NONE DETECTED NONE DETECTED   Tetrahydrocannabinol NONE DETECTED NONE DETECTED   Barbiturates (A) NONE DETECTED    Result not available. Reagent lot number recalled by manufacturer.   Imaging Studies: No results found.  ED COURSE and MDM  Nursing notes and initial vitals signs, including pulse oximetry, reviewed.  Vitals:   03/12/18 0132 03/12/18 0133 03/12/18 0156  BP:  131/90 104/83  Pulse:  87 81  Resp:  18 17  Temp:  98.1 F (36.7 C) 98.2 F (36.8 C)  TempSrc:  Oral Oral  SpO2:  100% 98%  Weight: 63.5 kg (140 lb)    Height: 5\' 5"  (1.651 m)     3:22 AM TTS has evaluated the patient and do not believe he meets inpatient criteria.  They wish to refer [facility].  PROCEDURES    ED DIAGNOSES     ICD-10-CM   1. Psychosis, schizophrenia, simple (HCC) F20.89        Brigett Estell, MD 03/12/18 0240    Paula LibraMolpus, Liany Mumpower, MD 03/12/18 [phone]

## 2018-03-12 NOTE — ED Triage Notes (Signed)
Pt presents with police with c/o hallucinations. Pt reports that he is having trouble thinking clearly and cannot think for himself. Pt reports he is hearing things but the voices are not telling him to do anything in particular. Denies SI/HI.

## 2018-04-11 ENCOUNTER — Encounter (HOSPITAL_COMMUNITY): Payer: Self-pay

## 2018-04-11 ENCOUNTER — Other Ambulatory Visit: Payer: Self-pay

## 2018-04-11 ENCOUNTER — Emergency Department (HOSPITAL_COMMUNITY)
Admission: EM | Admit: 2018-04-11 | Discharge: 2018-04-11 | Disposition: A | Discharge status: Home / Self Care | Payer: Medicaid Other | Attending: Emergency Medicine | Admitting: Emergency Medicine

## 2018-04-11 ENCOUNTER — Telehealth (INDEPENDENT_AMBULATORY_CARE_PROVIDER_SITE_OTHER): Payer: Self-pay

## 2018-04-11 ENCOUNTER — Emergency Department (HOSPITAL_COMMUNITY): Payer: Medicaid Other

## 2018-04-11 DIAGNOSIS — Y999 Unspecified external cause status: Secondary | ICD-10-CM | POA: Diagnosis not present

## 2018-04-11 DIAGNOSIS — S8992XA Unspecified injury of left lower leg, initial encounter: Secondary | ICD-10-CM | POA: Diagnosis present

## 2018-04-11 DIAGNOSIS — Y9339 Activity, other involving climbing, rappelling and jumping off: Secondary | ICD-10-CM | POA: Insufficient documentation

## 2018-04-11 DIAGNOSIS — Z87891 Personal history of nicotine dependence: Secondary | ICD-10-CM | POA: Diagnosis not present

## 2018-04-11 DIAGNOSIS — Y929 Unspecified place or not applicable: Secondary | ICD-10-CM | POA: Insufficient documentation

## 2018-04-11 DIAGNOSIS — Z79899 Other long term (current) drug therapy: Secondary | ICD-10-CM | POA: Insufficient documentation

## 2018-04-11 DIAGNOSIS — S82102A Unspecified fracture of upper end of left tibia, initial encounter for closed fracture: Secondary | ICD-10-CM | POA: Diagnosis not present

## 2018-04-11 DIAGNOSIS — X500XXA Overexertion from strenuous movement or load, initial encounter: Secondary | ICD-10-CM | POA: Insufficient documentation

## 2018-04-11 MED ORDER — OXYCODONE-ACETAMINOPHEN 5-325 MG PO TABS
2.0000 | ORAL_TABLET | Freq: Once | ORAL | Status: AC
  Administered 2018-04-11: 2 via ORAL
  Filled 2018-04-11: qty 2

## 2018-04-11 MED ORDER — OXYCODONE-ACETAMINOPHEN 5-325 MG PO TABS: 1.0000 | ORAL_TABLET | Freq: Four times a day (QID) | ORAL | 0 refills | Status: DC | PRN

## 2018-04-11 NOTE — ED Notes (Signed)
Bed: WA06 Expected date:  Expected time:  Means of arrival:  Comments: EMS knee injury

## 2018-04-11 NOTE — ED Provider Notes (Signed)
[facility]-EMERGENCY DEPT Provider Note   CSN: [id number] Arrival date & time: 04/11/18  0029     History   Chief Complaint Chief Complaint  Patient presents with  . Knee Pain    HPI [name] [name] is a 27 y.o. male.   27 year old male with a history of schizophrenia presents to the emergency department for evaluation of left knee pain.  He states that he was jumping off of a brick wall when he did not realize it was higher than he anticipated.  He landed on his feet, but had pain in his left knee after hearing a "pop".  He reports inability to bear weight on his left leg since time of the incident.  Denies any prior knee injury.  No complaints of numbness or paresthesias.  No medications taken prior to arrival for symptoms.     Past Medical History:  Diagnosis Date  . Schizophrenia [facility])     Patient Active Problem List   Diagnosis Date Noted  . Auditory hallucination   . S/P appendectomy 07/16/2017  . 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 LAPAROSCOPIC APPENDECTOMY N/A 07/16/2017   Procedure: APPENDECTOMY LAPAROSCOPIC;  Surgeon: Axel Filler, MD;  Location: [facility] OR;  Service: General;  Laterality: N/A;  . LAPAROTOMY N/A 07/16/2017   Procedure: POSSIBLE OPEN  LAPAROTOMY;  Surgeon: Axel Filler, MD;  Location: [facility] OR;  Service: General;  Laterality: N/A;  . NO PAST SURGERIES          Home Medications    Prior to Admission medications   Medication Sig Start Date End Date Taking? Authorizing Provider  ARIPiprazole (ABILIFY) 10 MG tablet Take 10 mg by mouth daily.    [provider]  oxyCODONE-acetaminophen (PERCOCET/ROXICET) 5-325 MG tablet Take 1-2 tablets by mouth every 6 (six) hours as needed for severe pain. 04/11/18   Antony Madura, PA-C    Family History Family History  Problem Relation Age of Onset  .  Other Mother   . Other Brother     Social History Social History   Tobacco Use  . Smoking status: Former Smoker    Types: Cigarettes    Last attempt to quit: 09/21/2016    Years since quitting: 1.5  . Smokeless tobacco: Never Used  Substance Use Topics  . Alcohol use: No    Frequency: Never  . Drug use: Yes    Types: Marijuana    Comment: denies use of marijuana 07/16/17     Allergies   Patient has no known allergies.   Review of Systems Review of Systems Ten systems reviewed and are negative for acute change, except as noted in the HPI.    Physical Exam Updated Vital Signs BP 109/74   Pulse 84   Temp 98.2 F (36.8 C) (Oral)   Resp 18   SpO2 98%   Physical Exam  Constitutional: He is oriented to person, place, and time. He appears well-developed and well-nourished. No distress.  Calm, nontoxic appearing.  HENT:  Head: Normocephalic and atraumatic.  Eyes: Conjunctivae and EOM are normal. No scleral icterus.  Neck: Normal range of motion.  Cardiovascular: Normal rate, regular rhythm and intact distal pulses.  DP and PT pulse 2+ in the LLE  Pulmonary/Chest: Effort normal. No respiratory distress.  Respirations even and unlabored  Musculoskeletal: He exhibits tenderness.       Left knee: He exhibits decreased range of  motion (2/2 pain), swelling, effusion and bony tenderness. Tenderness found.  Neurological: He is alert and oriented to person, place, and time. He exhibits normal muscle tone. Coordination normal.  Sensation to light touch intact and equal in BLE. Patient able to wiggle all toes.  Skin: Skin is warm and dry. No rash noted. He is not diaphoretic. No erythema. No pallor.  Psychiatric: He has a normal mood and affect. His behavior is normal.  Nursing note and vitals reviewed.    ED Treatments / Results  Labs (all labs ordered are listed, but only abnormal results are displayed) Labs Reviewed - No data to display  EKG None  Radiology Ct Knee  Left Wo Contrast  Result Date: 04/11/2018 CLINICAL DATA:  Evaluate fracture seen on plain films. EXAM: CT OF THE left KNEE WITHOUT CONTRAST TECHNIQUE: Multidetector CT imaging of the left knee was performed according to the standard protocol. Multiplanar CT image reconstructions were also generated. COMPARISON:  Left knee radiographs 04/11/2018 FINDINGS: Bones/Joint/Cartilage Comminuted fractures demonstrated in the proximal left tibia involving the medial and lateral tibial plateau with extension to the medial metaphysis. There is mild impaction of fracture fragments with mild posterior displacement of posterior tibial fragments. Displaced fragment of the posterior tibial spine. Tiny intra-articular fragments demonstrated in the medial compartment. There is an approximately 11 mm cortical gap in the medial tibial plateau. There is approximately 5 mm depression of the lateral tibial plateau fragments. The visualized distal femur and proximal fibula appear intact. Large left knee effusion with fat fluid level consistent with hemarthrosis. There is a tiny amount of air within the suprapatellar bursa suggesting an open component to the fracture. Ligaments Suboptimally assessed by CT. Muscles and Tendons No intramuscular hematoma or collection identified. Soft tissues Calcifications in the anterior soft tissues probably represent phleboliths. Mild soft tissue edema anterior to the patella. IMPRESSION: Comminuted fractures of the proximal left tibia involving the medial and lateral tibial plateau with extension to the medial metaphysis. Tiny intra-articular fragments in the medial compartment. Large left knee effusion with hemarthrosis. Electronically Signed   By: Burman NievesWilliam  Stevens M.D.   On: 04/11/2018 02:42   Dg Knee Complete 4 Views Left  Result Date: 04/11/2018 CLINICAL DATA:  Left knee pain after jumping off a brick wall. Unable to bear weight. EXAM: LEFT KNEE - COMPLETE 4+ VIEW COMPARISON:  None. FINDINGS:  Comminuted fractures demonstrated in the proximal left tibia involving medial and lateral tibial plateau and proximal tibial metaphysis. There is impaction of fracture fragments. There appears to be a displaced fracture fragment in the inter trochlear region. Large left knee effusion with hemarthrosis. IMPRESSION: Comminuted fractures of the proximal left tibia involving the medial and lateral tibial plateau and proximal tibial metaphysis. Displaced fracture fragment in the inter trochlear region. Large left knee effusion. Electronically Signed   By: Burman NievesWilliam  Stevens M.D.   On: 04/11/2018 01:10    Procedures Procedures (including critical care time)  Medications Ordered in ED Medications  oxyCODONE-acetaminophen (PERCOCET/ROXICET) 5-325 MG per tablet 2 tablet (2 tablets Oral Given 04/11/18 0139)    2:26 AM Spoke with Dr. Roda ShuttersXu of Orthopedics. Will review patient's imaging. Current plan for knee immobilizer and outpatient follow up.   Initial Impression / Assessment and Plan / ED Course  I have reviewed the triage vital signs and the nursing notes.  Pertinent labs & imaging results that were available during my care of the patient were reviewed by me and considered in my medical decision making (see chart for  details).     27 year old male presenting for left knee pain after jumping off a brick wall.  He is neurovascularly intact with an effusion to the left knee.  This correlates with a comminuted, displaced proximal tibial fracture which includes the tibial plateau.  CT of the knee obtained for further detail.  He was placed in knee immobilizer and given crutches for nonweightbearing.  Pain is well controlled with immobilization and Percocet.  Compartments are soft on initial and repeat exam.  Explained the need for prompt outpatient orthopedic follow-up.  Dr. Roda Shutters also made aware of patient's presentation prior to discharge.  Return precautions discussed and provided. Patient discharged in stable  condition with no unaddressed concerns.   Final Clinical Impressions(s) / ED Diagnoses   Final diagnoses:  Closed fracture of proximal end of left tibia, unspecified fracture morphology, initial encounter    ED Discharge Orders        Ordered    oxyCODONE-acetaminophen (PERCOCET/ROXICET) 5-325 MG tablet  Every 6 hours PRN     04/11/18 0312       Antony Madura, PA-C 04/11/18 0318    Molpus, Jonny Ruiz, MD 04/11/18 [phone]

## 2018-04-11 NOTE — ED Notes (Signed)
Patient attempting to contact his father for a ride home. Patient does not have a phone with him and needs to use the one in the room to get a hold of his father.

## 2018-04-11 NOTE — ED Triage Notes (Signed)
Patient BIB EMS for left knee pain after jumping off of a brick wall. Patient is unable to put weight on it and says he heard "a pop".   Vitals BP 110/70 P 66 SpO2 97 RR 18  Pain 10/10

## 2018-04-11 NOTE — Discharge Instructions (Signed)
Call the office of Dr. Roda ShuttersXu this morning to schedule an appointment for follow-up this week.  Keep your knee immobilizer on at all times, though you may remove it temporarily to shower or bathe.  Use crutches to prevent from putting weight on your left leg.  Apply ice packs over top of your immobilizer to limit swelling.  You have been prescribed Percocet to take for pain control.  Take as prescribed.  Do not drive or drink alcohol after taking this medication as it may make you drowsy and impair your judgment.  Return to the emergency department for new or concerning symptoms including severe worsening of your pain, numbness to your left leg or foot, difficulty moving her left foot or ankle, or coolness to your foot.

## 2018-04-11 NOTE — Telephone Encounter (Signed)
Called patient to make appt for tomorrow per Dr Roda ShuttersXu. He did not answer LMOM to return my call.   VM #1

## 2018-04-11 NOTE — ED Notes (Signed)
Bed: WA06 Expected date:  Expected time:  Means of arrival:  Comments: 

## 2018-04-11 NOTE — ED Notes (Signed)
Bed: [id number] Expected date:  Expected time:  Means of arrival:  Comments: Room 6

## 2018-04-12 ENCOUNTER — Ambulatory Visit (INDEPENDENT_AMBULATORY_CARE_PROVIDER_SITE_OTHER): Payer: Medicaid Other | Admitting: Orthopaedic Surgery

## 2018-04-12 ENCOUNTER — Encounter (INDEPENDENT_AMBULATORY_CARE_PROVIDER_SITE_OTHER): Payer: Self-pay | Admitting: Orthopaedic Surgery

## 2018-04-12 DIAGNOSIS — S82142A Displaced bicondylar fracture of left tibia, initial encounter for closed fracture: Secondary | ICD-10-CM

## 2018-04-12 NOTE — Progress Notes (Signed)
Office Visit Note   Patient: [name] [name]           Date of Birth: Jun 29, 1991           MRN: [id number] Visit Date: 04/12/2018              Requested by: No referring provider defined for this encounter. PCP: Patient, No Pcp Per   Assessment & Plan: Visit Diagnoses:  1. Displaced bicondylar fracture of left tibia, initial encounter for closed fracture     Plan: Impression is 27 year old gentleman with displaced left bicondylar tibial plateau fracture.  These x-rays and CTs findings were discussed with the patient.  Recommendation is for surgical fixation and restoration of joint line.  We discussed risks and benefits alternatives to surgical fixation.  Patient elects and agrees to proceed with ORIF.  70 cc of blood was aspirated from the left knee joint today to give him some pain relief.  We will schedule his surgery for this week.  Follow-Up Instructions: Return if symptoms worsen or fail to improve.   Orders:  No orders of the defined types were placed in this encounter.  No orders of the defined types were placed in this encounter.     Procedures: Large Joint Inj: L knee on 04/12/2018 9:29 AM Details: 22 G needle, superolateral approach Aspirate: 70 mL bloody Outcome: tolerated well, no immediate complications Patient was prepped and draped in the usual sterile fashion.       Clinical Data: No additional findings.   Subjective: Chief Complaint  Patient presents with  . Left Knee - Pain, Injury    Patient is a healthy 27 year old gentleman who comes in with acute bicondylar tibial plateau fracture that he sustained from jumping off of a wall yesterday.  He was evaluated in the ED.  His compartments have been soft.  His pain is moderate.  He does endorse discomfort and pain that is worse in the morning.  Denies any numbness and tingling.   Review of Systems  Constitutional: Negative.   All other systems reviewed and are negative.    Objective: Vital  Signs: There were no vitals taken for this visit.  Physical Exam  Constitutional: He is oriented to person, place, and time. He appears well-developed and well-nourished.  HENT:  Head: Normocephalic and atraumatic.  Eyes: Pupils are equal, round, and reactive to light.  Neck: Neck supple.  Pulmonary/Chest: Effort normal.  Abdominal: Soft.  Musculoskeletal: Normal range of motion.  Neurological: He is alert and oriented to person, place, and time.  Skin: Skin is warm.  Psychiatric: He has a normal mood and affect. His behavior is normal. Judgment and thought content normal.  Nursing note and vitals reviewed.   Ortho Exam Left knee exam shows a large joint effusion.  Compartments are soft and nontender with compression.  Foot is warm well perfused. Specialty Comments:  No specialty comments available.  Imaging: No results found.   PMFS History: Patient Active Problem List   Diagnosis Date Noted  . Auditory hallucination   . S/P appendectomy 07/16/2017  . 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 Schizophrenia (HCC)     Family History  Problem Relation Age of Onset  . Other Mother   . Other Brother     Past Surgical History:  Procedure Laterality Date  . LAPAROSCOPIC APPENDECTOMY N/A 07/16/2017   Procedure: APPENDECTOMY LAPAROSCOPIC;  Surgeon: Axel Filleramirez, Armando, MD;  Location: [facility] OR;  Service: General;  Laterality: N/A;  . LAPAROTOMY N/A 07/16/2017   Procedure: POSSIBLE OPEN  LAPAROTOMY;  Surgeon: Axel Filler, MD;  Location: [facility] OR;  Service: General;  Laterality: N/A;  . NO PAST SURGERIES     Social History   Occupational History  . Not on file  Tobacco Use  . Smoking status: Former Smoker    Types: Cigarettes    Last attempt to quit: 09/21/2016    Years since quitting: 1.5  . Smokeless tobacco: Never Used  Substance and Sexual Activity  . Alcohol use: No      Frequency: Never  . Drug use: Yes    Types: Marijuana    Comment: denies use of marijuana 07/16/17  . Sexual activity: Not on file

## 2018-04-14 ENCOUNTER — Other Ambulatory Visit: Payer: Self-pay

## 2018-04-14 ENCOUNTER — Encounter (HOSPITAL_COMMUNITY): Payer: Self-pay | Admitting: *Deleted

## 2018-04-14 NOTE — Progress Notes (Signed)
Pt denies SOB, chest pain, and being under the care of a cardiologist. Pt denies having a stress test, echo and cardiac cath. Pt denies having a chest x ray within the last year. Pt denies recent labs. Pt made aware to stop taking vitamins, fish oil and herbal medications. Do not take any NSAIDs ie: Ibuprofen, Advil, Naproxen (Aleve), Motrin, BC and Goody Powder or any medication containing Aspirin. Pt verbalized understanding of all pre-op instructions.

## 2018-04-15 ENCOUNTER — Observation Stay (HOSPITAL_COMMUNITY)
Admission: RE | Admit: 2018-04-15 | Discharge: 2018-04-17 | Disposition: A | Discharge status: Home / Self Care | Payer: Medicaid Other | Source: Ambulatory Visit | Attending: Orthopaedic Surgery | Admitting: Orthopaedic Surgery

## 2018-04-15 ENCOUNTER — Other Ambulatory Visit: Payer: Self-pay

## 2018-04-15 ENCOUNTER — Ambulatory Visit (HOSPITAL_COMMUNITY): Payer: Medicaid Other | Admitting: Anesthesiology

## 2018-04-15 ENCOUNTER — Encounter (HOSPITAL_COMMUNITY): Payer: Self-pay

## 2018-04-15 ENCOUNTER — Ambulatory Visit (HOSPITAL_COMMUNITY): Payer: Medicaid Other

## 2018-04-15 ENCOUNTER — Encounter (HOSPITAL_COMMUNITY)
Admission: RE | Disposition: A | Discharge status: Home / Self Care | Payer: Self-pay | Source: Ambulatory Visit | Attending: Orthopaedic Surgery

## 2018-04-15 DIAGNOSIS — Z79891 Long term (current) use of opiate analgesic: Secondary | ICD-10-CM | POA: Diagnosis not present

## 2018-04-15 DIAGNOSIS — Z87891 Personal history of nicotine dependence: Secondary | ICD-10-CM | POA: Insufficient documentation

## 2018-04-15 DIAGNOSIS — X58XXXA Exposure to other specified factors, initial encounter: Secondary | ICD-10-CM | POA: Insufficient documentation

## 2018-04-15 DIAGNOSIS — S82142A Displaced bicondylar fracture of left tibia, initial encounter for closed fracture: Principal | ICD-10-CM | POA: Insufficient documentation

## 2018-04-15 DIAGNOSIS — Z419 Encounter for procedure for purposes other than remedying health state, unspecified: Secondary | ICD-10-CM

## 2018-04-15 DIAGNOSIS — F209 Schizophrenia, unspecified: Secondary | ICD-10-CM | POA: Insufficient documentation

## 2018-04-15 DIAGNOSIS — Z9889 Other specified postprocedural states: Secondary | ICD-10-CM

## 2018-04-15 HISTORY — PX: ORIF TIBIA PLATEAU: SHX2132

## 2018-04-15 HISTORY — DX: Displaced bicondylar fracture of left tibia, initial encounter for closed fracture: S82.142A

## 2018-04-15 LAB — CBC
HCT: 33.8 % — ABNORMAL LOW (ref 39.0–52.0)
Hemoglobin: 11.1 g/dL — ABNORMAL LOW (ref 13.0–17.0)
MCH: 29.2 pg (ref 26.0–34.0)
MCHC: 32.8 g/dL (ref 30.0–36.0)
MCV: 88.9 fL (ref 78.0–100.0)
Platelets: 288 10*3/uL (ref 150–400)
RBC: 3.8 MIL/uL — ABNORMAL LOW (ref 4.22–5.81)
RDW: 11.9 % (ref 11.5–15.5)
WBC: 5.5 10*3/uL (ref 4.0–10.5)

## 2018-04-15 SURGERY — OPEN REDUCTION INTERNAL FIXATION (ORIF) TIBIAL PLATEAU
Anesthesia: General | Site: Knee | Laterality: Left

## 2018-04-15 MED ORDER — EPHEDRINE SULFATE 50 MG/ML IJ SOLN
INTRAMUSCULAR | Status: AC
  Filled 2018-04-15: qty 1

## 2018-04-15 MED ORDER — SENNOSIDES-DOCUSATE SODIUM 8.6-50 MG PO TABS: 1.0000 | ORAL_TABLET | Freq: Every evening | ORAL | 1 refills | Status: DC | PRN

## 2018-04-15 MED ORDER — FENTANYL CITRATE (PF) 100 MCG/2ML IJ SOLN
INTRAMUSCULAR | Status: DC | PRN
  Administered 2018-04-15 (×3): 50 ug via INTRAVENOUS

## 2018-04-15 MED ORDER — BUPIVACAINE HCL (PF) 0.25 % IJ SOLN
INTRAMUSCULAR | Status: DC | PRN
  Administered 2018-04-15: 10 mL

## 2018-04-15 MED ORDER — ONDANSETRON HCL 4 MG/2ML IJ SOLN
INTRAMUSCULAR | Status: DC | PRN
  Administered 2018-04-15: 4 mg via INTRAVENOUS

## 2018-04-15 MED ORDER — OXYCODONE HCL 5 MG/5ML PO SOLN: 5.0000 mg | Freq: Once | ORAL | Status: DC | PRN

## 2018-04-15 MED ORDER — PROMETHAZINE HCL 25 MG PO TABS: 25.0000 mg | ORAL_TABLET | Freq: Four times a day (QID) | ORAL | 1 refills | Status: DC | PRN

## 2018-04-15 MED ORDER — SUCCINYLCHOLINE CHLORIDE 200 MG/10ML IV SOSY
PREFILLED_SYRINGE | INTRAVENOUS | Status: AC
  Filled 2018-04-15: qty 10

## 2018-04-15 MED ORDER — 0.9 % SODIUM CHLORIDE (POUR BTL) OPTIME
TOPICAL | Status: DC | PRN
  Administered 2018-04-15 (×2): 1000 mL

## 2018-04-15 MED ORDER — ONDANSETRON HCL 4 MG/2ML IJ SOLN: 4.0000 mg | Freq: Four times a day (QID) | INTRAMUSCULAR | Status: DC | PRN

## 2018-04-15 MED ORDER — HYDROMORPHONE HCL 1 MG/ML IJ SOLN
0.5000 mg | INTRAMUSCULAR | Status: DC | PRN
  Administered 2018-04-16 (×2): 1 mg via INTRAVENOUS
  Filled 2018-04-15 (×2): qty 1

## 2018-04-15 MED ORDER — METHOCARBAMOL 1000 MG/10ML IJ SOLN
500.0000 mg | Freq: Four times a day (QID) | INTRAVENOUS | Status: DC | PRN
  Filled 2018-04-15: qty 5

## 2018-04-15 MED ORDER — CHLORHEXIDINE GLUCONATE 4 % EX LIQD: 60.0000 mL | Freq: Once | CUTANEOUS | Status: DC

## 2018-04-15 MED ORDER — POLYETHYLENE GLYCOL 3350 17 G PO PACK: 17.0000 g | PACK | Freq: Every day | ORAL | Status: DC | PRN

## 2018-04-15 MED ORDER — VANCOMYCIN HCL 1000 MG IV SOLR
INTRAVENOUS | Status: DC | PRN
  Administered 2018-04-15: 1000 mg via TOPICAL

## 2018-04-15 MED ORDER — ONDANSETRON HCL 4 MG PO TABS: 4.0000 mg | ORAL_TABLET | Freq: Three times a day (TID) | ORAL | 0 refills | Status: DC | PRN

## 2018-04-15 MED ORDER — METOCLOPRAMIDE HCL 5 MG PO TABS: 5.0000 mg | ORAL_TABLET | Freq: Three times a day (TID) | ORAL | Status: DC | PRN

## 2018-04-15 MED ORDER — BUPIVACAINE HCL (PF) 0.25 % IJ SOLN
INTRAMUSCULAR | Status: AC
  Filled 2018-04-15: qty 30

## 2018-04-15 MED ORDER — ASPIRIN EC 81 MG PO TBEC: 81.0000 mg | DELAYED_RELEASE_TABLET | Freq: Two times a day (BID) | ORAL | 0 refills | Status: DC

## 2018-04-15 MED ORDER — PROPOFOL 10 MG/ML IV BOLUS
INTRAVENOUS | Status: DC | PRN
  Administered 2018-04-15: 50 mg via INTRAVENOUS
  Administered 2018-04-15: 150 mg via INTRAVENOUS

## 2018-04-15 MED ORDER — PHENYLEPHRINE 40 MCG/ML (10ML) SYRINGE FOR IV PUSH (FOR BLOOD PRESSURE SUPPORT)
PREFILLED_SYRINGE | INTRAVENOUS | Status: AC
  Filled 2018-04-15: qty 10

## 2018-04-15 MED ORDER — ZINC SULFATE 220 (50 ZN) MG PO CAPS: 220.0000 mg | ORAL_CAPSULE | Freq: Every day | ORAL | 0 refills | Status: DC

## 2018-04-15 MED ORDER — ROCURONIUM BROMIDE 10 MG/ML (PF) SYRINGE
PREFILLED_SYRINGE | INTRAVENOUS | Status: AC
  Filled 2018-04-15: qty 10

## 2018-04-15 MED ORDER — ONDANSETRON HCL 4 MG PO TABS: 4.0000 mg | ORAL_TABLET | Freq: Four times a day (QID) | ORAL | Status: DC | PRN

## 2018-04-15 MED ORDER — ACETAMINOPHEN 325 MG PO TABS
325.0000 mg | ORAL_TABLET | Freq: Four times a day (QID) | ORAL | Status: DC | PRN
  Administered 2018-04-17 (×2): 650 mg via ORAL
  Filled 2018-04-15 (×2): qty 2

## 2018-04-15 MED ORDER — LACTATED RINGERS IV SOLN
INTRAVENOUS | Status: DC
  Administered 2018-04-15: 12:00:00 via INTRAVENOUS

## 2018-04-15 MED ORDER — LIDOCAINE 2% (20 MG/ML) 5 ML SYRINGE
INTRAMUSCULAR | Status: AC
  Filled 2018-04-15: qty 5

## 2018-04-15 MED ORDER — OXYCODONE HCL 5 MG PO TABS
5.0000 mg | ORAL_TABLET | ORAL | Status: DC | PRN
  Administered 2018-04-15 – 2018-04-16 (×2): 10 mg via ORAL
  Filled 2018-04-15: qty 2

## 2018-04-15 MED ORDER — HYDROMORPHONE HCL 1 MG/ML IJ SOLN
INTRAMUSCULAR | Status: AC
  Filled 2018-04-15: qty 1

## 2018-04-15 MED ORDER — SUGAMMADEX SODIUM 200 MG/2ML IV SOLN
INTRAVENOUS | Status: AC
  Filled 2018-04-15: qty 2

## 2018-04-15 MED ORDER — HYDROMORPHONE HCL 1 MG/ML IJ SOLN
0.5000 mg | INTRAMUSCULAR | Status: AC
  Administered 2018-04-15 (×2): 0.5 mg via INTRAVENOUS

## 2018-04-15 MED ORDER — ROCURONIUM BROMIDE 100 MG/10ML IV SOLN
INTRAVENOUS | Status: DC | PRN
  Administered 2018-04-15: 60 mg via INTRAVENOUS

## 2018-04-15 MED ORDER — HYDROMORPHONE HCL 1 MG/ML IJ SOLN
INTRAMUSCULAR | Status: AC
  Administered 2018-04-15: 0.5 mg via INTRAVENOUS
  Filled 2018-04-15: qty 1

## 2018-04-15 MED ORDER — METHOCARBAMOL 500 MG PO TABS
500.0000 mg | ORAL_TABLET | Freq: Four times a day (QID) | ORAL | Status: DC | PRN
  Administered 2018-04-15 – 2018-04-17 (×5): 500 mg via ORAL
  Filled 2018-04-15 (×5): qty 1

## 2018-04-15 MED ORDER — LIDOCAINE HCL (CARDIAC) PF 100 MG/5ML IV SOSY
PREFILLED_SYRINGE | INTRAVENOUS | Status: DC | PRN
  Administered 2018-04-15: 50 mg via INTRAVENOUS

## 2018-04-15 MED ORDER — ONDANSETRON HCL 4 MG/2ML IJ SOLN
INTRAMUSCULAR | Status: AC
  Filled 2018-04-15: qty 4

## 2018-04-15 MED ORDER — METOCLOPRAMIDE HCL 5 MG/ML IJ SOLN: 5.0000 mg | Freq: Three times a day (TID) | INTRAMUSCULAR | Status: DC | PRN

## 2018-04-15 MED ORDER — VANCOMYCIN HCL 1000 MG IV SOLR
INTRAVENOUS | Status: AC
  Filled 2018-04-15: qty 1000

## 2018-04-15 MED ORDER — OXYCODONE HCL 5 MG PO TABS: 5.0000 mg | ORAL_TABLET | ORAL | 0 refills | Status: DC | PRN

## 2018-04-15 MED ORDER — SODIUM CHLORIDE 0.9 % IV SOLN
INTRAVENOUS | Status: DC
  Administered 2018-04-15: 18:00:00 via INTRAVENOUS

## 2018-04-15 MED ORDER — FENTANYL CITRATE (PF) 250 MCG/5ML IJ SOLN
INTRAMUSCULAR | Status: AC
  Filled 2018-04-15: qty 5

## 2018-04-15 MED ORDER — MAGNESIUM CITRATE PO SOLN: 1.0000 | Freq: Once | ORAL | Status: DC | PRN

## 2018-04-15 MED ORDER — SUGAMMADEX SODIUM 200 MG/2ML IV SOLN
INTRAVENOUS | Status: DC | PRN
  Administered 2018-04-15: 120 mg via INTRAVENOUS

## 2018-04-15 MED ORDER — DOCUSATE SODIUM 100 MG PO CAPS
100.0000 mg | ORAL_CAPSULE | Freq: Two times a day (BID) | ORAL | Status: DC
  Administered 2018-04-15 – 2018-04-17 (×4): 100 mg via ORAL
  Filled 2018-04-15 (×4): qty 1

## 2018-04-15 MED ORDER — CALCIUM CARBONATE-VITAMIN D 500-200 MG-UNIT PO TABS: 1.0000 | ORAL_TABLET | Freq: Three times a day (TID) | ORAL | 12 refills | Status: DC

## 2018-04-15 MED ORDER — OXYCODONE HCL 5 MG PO TABS
10.0000 mg | ORAL_TABLET | ORAL | Status: DC | PRN
  Administered 2018-04-16 – 2018-04-17 (×6): 15 mg via ORAL
  Filled 2018-04-15: qty 3
  Filled 2018-04-15 (×2): qty 2
  Filled 2018-04-15 (×3): qty 3
  Filled 2018-04-15: qty 2
  Filled 2018-04-15: qty 3

## 2018-04-15 MED ORDER — DEXAMETHASONE SODIUM PHOSPHATE 10 MG/ML IJ SOLN
INTRAMUSCULAR | Status: AC
  Filled 2018-04-15: qty 1

## 2018-04-15 MED ORDER — DEXMEDETOMIDINE HCL IN NACL 200 MCG/50ML IV SOLN
INTRAVENOUS | Status: DC | PRN
  Administered 2018-04-15: 4 ug via INTRAVENOUS
  Administered 2018-04-15: 8 ug via INTRAVENOUS
  Administered 2018-04-15 (×2): 4 ug via INTRAVENOUS
  Administered 2018-04-15: 8 ug via INTRAVENOUS

## 2018-04-15 MED ORDER — PROMETHAZINE HCL 25 MG/ML IJ SOLN: 6.2500 mg | INTRAMUSCULAR | Status: DC | PRN

## 2018-04-15 MED ORDER — MIDAZOLAM HCL 2 MG/2ML IJ SOLN
INTRAMUSCULAR | Status: AC
  Filled 2018-04-15: qty 2

## 2018-04-15 MED ORDER — CELECOXIB 200 MG PO CAPS
200.0000 mg | ORAL_CAPSULE | Freq: Two times a day (BID) | ORAL | Status: DC
  Administered 2018-04-15 – 2018-04-17 (×4): 200 mg via ORAL
  Filled 2018-04-15 (×4): qty 1

## 2018-04-15 MED ORDER — CEFAZOLIN SODIUM-DEXTROSE 2-4 GM/100ML-% IV SOLN
2.0000 g | INTRAVENOUS | Status: AC
  Administered 2018-04-15: 2 g via INTRAVENOUS
  Filled 2018-04-15: qty 100

## 2018-04-15 MED ORDER — DIPHENHYDRAMINE HCL 12.5 MG/5ML PO ELIX: 25.0000 mg | ORAL_SOLUTION | ORAL | Status: DC | PRN

## 2018-04-15 MED ORDER — MEPERIDINE HCL 50 MG/ML IJ SOLN: 6.2500 mg | INTRAMUSCULAR | Status: DC | PRN

## 2018-04-15 MED ORDER — OXYCODONE HCL 5 MG PO TABS: 5.0000 mg | ORAL_TABLET | Freq: Once | ORAL | Status: DC | PRN

## 2018-04-15 MED ORDER — ASPIRIN 81 MG PO CHEW
81.0000 mg | CHEWABLE_TABLET | Freq: Two times a day (BID) | ORAL | Status: DC
  Administered 2018-04-15 – 2018-04-17 (×4): 81 mg via ORAL
  Filled 2018-04-15 (×4): qty 1

## 2018-04-15 MED ORDER — METHOCARBAMOL 750 MG PO TABS: 750.0000 mg | ORAL_TABLET | Freq: Two times a day (BID) | ORAL | 0 refills | Status: DC | PRN

## 2018-04-15 MED ORDER — HYDROMORPHONE HCL 1 MG/ML IJ SOLN
0.2500 mg | INTRAMUSCULAR | Status: DC | PRN
  Administered 2018-04-15 (×4): 0.5 mg via INTRAVENOUS

## 2018-04-15 MED ORDER — MIDAZOLAM HCL 5 MG/5ML IJ SOLN
INTRAMUSCULAR | Status: DC | PRN
  Administered 2018-04-15: 2 mg via INTRAVENOUS

## 2018-04-15 MED ORDER — CEFAZOLIN SODIUM-DEXTROSE 2-4 GM/100ML-% IV SOLN
2.0000 g | Freq: Four times a day (QID) | INTRAVENOUS | Status: AC
  Administered 2018-04-15 – 2018-04-16 (×3): 2 g via INTRAVENOUS
  Filled 2018-04-15 (×3): qty 100

## 2018-04-15 MED ORDER — SORBITOL 70 % SOLN: 30.0000 mL | Freq: Every day | Status: DC | PRN

## 2018-04-15 MED ORDER — LACTATED RINGERS IV SOLN: INTRAVENOUS | Status: DC

## 2018-04-15 SURGICAL SUPPLY — 69 items
BANDAGE ACE 4X5 VEL STRL LF (GAUZE/BANDAGES/DRESSINGS) ×2 IMPLANT
BANDAGE ACE 6X5 VEL STRL LF (GAUZE/BANDAGES/DRESSINGS) ×2 IMPLANT
BANDAGE ESMARK 6X9 LF (GAUZE/BANDAGES/DRESSINGS) ×1 IMPLANT
BIT DRILL 2.5MM SMALL QC EVOS (BIT) ×1 IMPLANT
BIT DRILL QC 2.5MM SHRT EVO SM (DRILL) ×1 IMPLANT
BLADE CLIPPER SURG (BLADE) IMPLANT
BNDG COHESIVE 6X5 TAN STRL LF (GAUZE/BANDAGES/DRESSINGS) ×2 IMPLANT
BNDG ESMARK 6X9 LF (GAUZE/BANDAGES/DRESSINGS) ×2
COVER SURGICAL LIGHT HANDLE (MISCELLANEOUS) ×2 IMPLANT
DRAPE C-ARM 42X72 X-RAY (DRAPES) ×2 IMPLANT
DRAPE C-ARMOR (DRAPES) ×2 IMPLANT
DRAPE HALF SHEET 40X57 (DRAPES) ×2 IMPLANT
DRAPE IMP U-DRAPE 54X76 (DRAPES) ×4 IMPLANT
DRAPE INCISE IOBAN 66X45 STRL (DRAPES) ×2 IMPLANT
DRAPE ORTHO SPLIT 77X108 STRL (DRAPES) ×2
DRAPE SURG ORHT 6 SPLT 77X108 (DRAPES) ×2 IMPLANT
DRAPE U-SHAPE 47X51 STRL (DRAPES) ×2 IMPLANT
DRILL 2.5MM SMALL QC EVOS (BIT) ×2
DRILL QC 2.5MM SHORT EVOS SM (DRILL) ×2
DRSG PAD ABDOMINAL 8X10 ST (GAUZE/BANDAGES/DRESSINGS) ×2 IMPLANT
DURAPREP 26ML APPLICATOR (WOUND CARE) ×2 IMPLANT
ELECT REM PT RETURN 9FT ADLT (ELECTROSURGICAL) ×2
ELECTRODE REM PT RTRN 9FT ADLT (ELECTROSURGICAL) ×1 IMPLANT
FACESHIELD STD STERILE (MASK) ×2 IMPLANT
GAUZE SPONGE 4X4 12PLY STRL (GAUZE/BANDAGES/DRESSINGS) ×2 IMPLANT
GAUZE SPONGE 4X4 12PLY STRL LF (GAUZE/BANDAGES/DRESSINGS) ×2 IMPLANT
GAUZE XEROFORM 1X8 LF (GAUZE/BANDAGES/DRESSINGS) ×2 IMPLANT
GAUZE XEROFORM 5X9 LF (GAUZE/BANDAGES/DRESSINGS) ×2 IMPLANT
GLOVE BIOGEL PI IND STRL 7.0 (GLOVE) ×1 IMPLANT
GLOVE BIOGEL PI INDICATOR 7.0 (GLOVE) ×1
GLOVE ECLIPSE 7.0 STRL STRAW (GLOVE) ×2 IMPLANT
GLOVE SKINSENSE NS SZ7.5 (GLOVE) ×1
GLOVE SKINSENSE STRL SZ7.5 (GLOVE) ×1 IMPLANT
GLOVE SURG SYN 7.5  E (GLOVE) ×1
GLOVE SURG SYN 7.5 E (GLOVE) ×1 IMPLANT
GOWN STRL REIN XL XLG (GOWN DISPOSABLE) ×6 IMPLANT
K-WIRE 2.0 (WIRE) ×1
K-WIRE TROCAR PT 2.0 150MM (WIRE) ×1
KIT BASIN OR (CUSTOM PROCEDURE TRAY) ×2 IMPLANT
KIT TURNOVER KIT B (KITS) ×2 IMPLANT
KWIRE TROCAR PT 2.0 150MM (WIRE) ×1 IMPLANT
MANIFOLD NEPTUNE II (INSTRUMENTS) ×2 IMPLANT
NDL SUT 6 .5 CRC .975X.05 MAYO (NEEDLE) IMPLANT
NEEDLE HYPO 25GX1X1/2 BEV (NEEDLE) ×2 IMPLANT
NEEDLE MAYO TAPER (NEEDLE)
NS IRRIG 1000ML POUR BTL (IV SOLUTION) ×2 IMPLANT
PACK GENERAL/GYN (CUSTOM PROCEDURE TRAY) ×2 IMPLANT
PAD ABD 8X10 STRL (GAUZE/BANDAGES/DRESSINGS) ×2 IMPLANT
PAD ARMBOARD 7.5X6 YLW CONV (MISCELLANEOUS) ×4 IMPLANT
PAD CAST 4YDX4 CTTN HI CHSV (CAST SUPPLIES) ×2 IMPLANT
PADDING CAST COTTON 4X4 STRL (CAST SUPPLIES) ×2
PLATE TIBIAL POST MEDIAL 7H (Plate) ×2 IMPLANT
SCREW CORT 3.5X44MM ST EVOS (Screw) ×2 IMPLANT
SCREW CTX 3.5X38MM EVOS (Screw) ×2 IMPLANT
SCREW CTX 3.5X50MM EVOS (Screw) ×2 IMPLANT
SCREW LOCK 3.5X65MM EVOS (Screw) ×2 IMPLANT
SCREW LOCK 3.5X70MM EVOS (Screw) ×2 IMPLANT
SCREW LOCK CORTICAL 3.5X42 (Screw) ×2 IMPLANT
STAPLER VISISTAT 35W (STAPLE) IMPLANT
SUT ETHILON 2 0 FS 18 (SUTURE) IMPLANT
SUT ETHILON 3 0 PS 1 (SUTURE) ×2 IMPLANT
SUT PDS AB 0 CT 36 (SUTURE) IMPLANT
SUT VIC AB 0 CT1 27 (SUTURE) ×3
SUT VIC AB 0 CT1 27XBRD ANBCTR (SUTURE) ×3 IMPLANT
SUT VIC AB 2-0 CT1 27 (SUTURE) ×2
SUT VIC AB 2-0 CT1 TAPERPNT 27 (SUTURE) ×2 IMPLANT
SYR CONTROL 10ML LL (SYRINGE) ×2 IMPLANT
TOWEL OR 17X24 6PK STRL BLUE (TOWEL DISPOSABLE) ×2 IMPLANT
TOWEL OR 17X26 10 PK STRL BLUE (TOWEL DISPOSABLE) ×4 IMPLANT

## 2018-04-15 NOTE — Op Note (Addendum)
Date of Surgery: 04/15/2018  INDICATIONS: [name] [name] is a 27 y.o.-year-old male with a left bicondylar tibial plateau fracture;  The patient did consent to the procedure after discussion of the risks and benefits.  PREOPERATIVE DIAGNOSIS: Left bicondylar tibial plateau fracture  POSTOPERATIVE DIAGNOSIS: Same.  PROCEDURE: Open reduction internal fixation of left bicondylar tibial plateau fracture  SURGEON: N. Glee Arvin, M.D.  ASSIST: Starlyn Skeans [place], [place]; necessary for the timely completion of procedure and due to complexity of procedure.  ANESTHESIA:  general  IV FLUIDS AND URINE: See anesthesia.  ESTIMATED BLOOD LOSS: minimal mL.  IMPLANTS: [employer] posteromedial tibial plateau plate  DRAINS: none  COMPLICATIONS: None.  DESCRIPTION OF PROCEDURE: The patient was brought to the operating room and placed supine on the operating table.  The patient had been signed prior to the procedure and this was documented. The patient had the anesthesia placed by the anesthesiologist.  A time-out was performed to confirm that this was the correct patient, site, side and location. The patient did receive antibiotics prior to the incision and was re-dosed during the procedure as needed at indicated intervals.  A tourniquet was placed.  The patient had the operative extremity prepped and draped in the standard surgical fashion.    An incision was created on the posterior medial border of the proximal tibia.  Dissection was carried through the subcutaneous tissue.  Branches of the saphenous vein were identified and cauterized to allow mobilization of the main saphenous vein.  We continued our dissection down onto the pes tendons.  This was sharply incised and tagged for later repair.  We then continued our dissection down onto the fascia of the posterior muscular compartment.  This was then elevated off of the posterior medial cortex of the tibia using a Cobb elevator.  The fracture  lines were exposed and visualized.  Retractors were placed.  A sub-meniscal arthrotomy was performed and evaluation of the medial meniscus was unremarkable.  I then removed organized hematoma from the fracture site.  I was then able to visualize the main fracture line and performed a reduction of the fracture which was then confirmed under fluoroscopy.  Provisional K wires were placed across the fracture in order to hold the posterior medial fracture fragment.  A posterior medial plate was then placed at the appropriate position in a buttress fashion.  I then placed a apex screw through the oblong hole of the plate into the tibial shaft in order to contour the plate down to the bone and to buttress the posterior medial fragment.  The screw had excellent purchase.  I then placed 2 more nonlocking shaft screw through the plate each with excellent purchase.  After this was done I then turned my attention to the proximal locking screws.  Using fluoroscopic guidance I placed 2 multidirectional locking screws through the proximal holes across the medial and the lateral tibial plateau fracture.  I then placed a final distal nonlocking screw through the plate into the tibial shaft with excellent purchase.  Orthogonal x-rays were then taken to confirm appropriate reduction of the fracture and placement of the hardware.  The surgical wound was then thoroughly irrigated with 3 L of normal saline.  The fascia was then closed with 0 Vicryl.  The pes tendons were repaired back with 0 Vicryl.  1 g of vancomycin powder was placed in the tissues.  Subcutaneous fat was closed with 0 Vicryl.  Subcutaneous skin layer was closed with 2-0 Vicryl and  the skin was closed with 3-0 nylon.  Sterile dressings were applied.  Patient tolerated procedure well had no major complications.  POSTOPERATIVE PLAN: Patient will be nonweightbearing for 6 weeks.  He may begin knee range of motion as tolerated immediately.  [name] ReelN. Michael Karry Barrilleaux, MD [facility] [phone] 3:40 PM

## 2018-04-15 NOTE — Discharge Instructions (Signed)
Postoperative instructions: ° °Weightbearing instructions: non weight bearing ° °Dressing instructions: Keep your dressing and/or splint clean and dry at all times.  It will be removed at your first post-operative appointment.  Your stitches and/or staples will be removed at this visit. ° °Incision instructions:  Do not soak your incision for 3 weeks after surgery.  If the incision gets wet, pat dry and do not scrub the incision. ° °Pain control:  You have been given a prescription to be taken as directed for post-operative pain control.  In addition, elevate the operative extremity above the heart at all times to prevent swelling and throbbing pain. ° °Take over-the-counter Colace, 100mg by mouth twice a day while taking narcotic pain medications to help prevent constipation. ° °Follow up appointments: °1) 10-14 days for suture removal and wound check. °2) Dr. Ginamarie Banfield as scheduled. ° ° ------------------------------------------------------------------------------------------------------------- ° °After Surgery Pain Control: ° °After your surgery, post-surgical discomfort or pain is likely. This discomfort can last several days to a few weeks. At certain times of the day your discomfort may be more intense.  °Did you receive a nerve block?  °A nerve block can provide pain relief for one hour to two days after your surgery. As long as the nerve block is working, you will experience little or no sensation in the area the surgeon operated on.  °As the nerve block wears off, you will begin to experience pain or discomfort. It is very important that you begin taking your prescribed pain medication before the nerve block fully wears off. Treating your pain at the first sign of the block wearing off will ensure your pain is better controlled and more tolerable when full-sensation returns. Do not wait until the pain is intolerable, as the medicine will be less effective. It is better to treat pain in advance than to try and  catch up.  °General Anesthesia:  °If you did not receive a nerve block during your surgery, you will need to start taking your pain medication shortly after your surgery and should continue to do so as prescribed by your surgeon.  °Pain Medication:  °Most commonly we prescribe Vicodin and Percocet for post-operative pain. Both of these medications contain a combination of acetaminophen (Tylenol®) and a narcotic to help control pain.  °· It takes between 30 and 45 minutes before pain medication starts to work. It is important to take your medication before your pain level gets too intense.  °· Nausea is a common side effect of many pain medications. You will want to eat something before taking your pain medicine to help prevent nausea.  °· If you are taking a prescription pain medication that contains acetaminophen, we recommend that you do not take additional over the counter acetaminophen (Tylenol®).  °Other pain relieving options:  °· Using a cold pack to ice the affected area a few times a day (15 to 20 minutes at a time) can help to relieve pain, reduce swelling and bruising.  °· Elevation of the affected area can also help to reduce pain and swelling. ° ° ° °

## 2018-04-15 NOTE — H&P (Signed)
PREOPERATIVE H&P  Chief Complaint: left bicondylar tibial plateau fracture  HPI: [name] [name] is a 27 y.o. male who presents for surgical treatment of left bicondylar tibial plateau fracture.  He denies any changes in medical history.  Past Medical History:  Diagnosis Date  . Schizophrenia (HCC)   . Tibial plateau fracture, left    Past Surgical History:  Procedure Laterality Date  . LAPAROSCOPIC APPENDECTOMY N/A 07/16/2017   Procedure: APPENDECTOMY LAPAROSCOPIC;  Surgeon: Axel Filler, MD;  Location: [facility];  Service: General;  Laterality: N/A;  . LAPAROTOMY N/A 07/16/2017   Procedure: POSSIBLE OPEN  LAPAROTOMY;  Surgeon: Axel Filler, MD;  Location: [facility] OR;  Service: General;  Laterality: N/A;  . NO PAST SURGERIES     Social History   Socioeconomic History  . Marital status: Single    Spouse name: Not on file  . Number of children: Not on file  . Years of education: Not on file  . Highest education level: Not on file  Occupational History  . Not on file  Social Needs  . Financial resource strain: Not on file  . Food insecurity:    Worry: Not on file    Inability: Not on file  . Transportation needs:    Medical: Not on file    Non-medical: Not on file  Tobacco Use  . Smoking status: Former Smoker    Types: Cigarettes    Last attempt to quit: 09/21/2016    Years since quitting: 1.5  . Smokeless tobacco: Never Used  Substance and Sexual Activity  . Alcohol use: No    Frequency: Never  . Drug use: Yes    Types: Marijuana    Comment: denies use of marijuana   . Sexual activity: Not on file  Lifestyle  . Physical activity:    Days per week: Not on file    Minutes per session: Not on file  . Stress: Not on file  Relationships  . Social connections:    Talks on phone: Not on file    Gets together: Not on file    Attends religious service: Not on file    Active member of club or organization: Not on file    Attends meetings of clubs or  organizations: Not on file    Relationship status: Not on file  Other Topics Concern  . Not on file  Social History Narrative  . Not on file   Family History  Problem Relation Age of Onset  . Other Mother   . Other Brother    No Known Allergies Prior to Admission medications   Medication Sig Start Date End Date Taking? Authorizing Provider  oxyCODONE-acetaminophen (PERCOCET/ROXICET) 5-325 MG tablet Take 1-2 tablets by mouth every 6 (six) hours as needed for severe pain. 04/11/18  Yes Antony Madura, PA-C     Positive ROS: All other systems have been reviewed and were otherwise negative with the exception of those mentioned in the HPI and as above.  Physical Exam: General: Alert, no acute distress Cardiovascular: No pedal edema Respiratory: No cyanosis, no use of accessory musculature GI: abdomen soft Skin: No lesions in the area of chief complaint Neurologic: Sensation intact distally Psychiatric: Patient is competent for consent with normal mood and affect Lymphatic: no lymphedema  MUSCULOSKELETAL: exam stable  Assessment: left bicondylar tibial plateau fracture  Plan: Plan for Procedure(s): OPEN REDUCTION INTERNAL FIXATION (ORIF) LEFT BICONDYLAR TIBIAL PLATEAU  The risks benefits and alternatives were discussed with the patient including but not  limited to the risks of nonoperative treatment, versus surgical intervention including infection, bleeding, nerve injury,  blood clots, cardiopulmonary complications, morbidity, mortality, among others, and they were willing to proceed.   Glee ArvinMichael Sesilia Poucher, MD   04/15/2018 11:24 AM

## 2018-04-15 NOTE — Progress Notes (Signed)
1745 Received pt from PACU, A&O x4. Left leg with ace wrap dry and intact. Able to wiggle toes, cool to touch. Elevated LLE to pillows.

## 2018-04-15 NOTE — Transfer of Care (Signed)
Immediate Anesthesia Transfer of Care Note  Patient: Grayce SessionsMaurice Pauli  Procedure(s) Performed: OPEN REDUCTION INTERNAL FIXATION (ORIF) LEFT BICONDYLAR TIBIAL PLATEAU (Left Knee)  Patient Location: PACU  Anesthesia Type:General  Level of Consciousness: drowsy and patient cooperative  Airway & Oxygen Therapy: Patient Spontanous Breathing and Patient connected to nasal cannula oxygen  Post-op Assessment: Report given to RN, Post -op Vital signs reviewed and stable and Patient moving all extremities X 4  Post vital signs: Reviewed and stable  Last Vitals:  Vitals Value Taken Time  BP 139/91 04/15/2018  4:12 PM  Temp    Pulse 90 04/15/2018  4:13 PM  Resp 21 04/15/2018  4:13 PM  SpO2 100 % 04/15/2018  4:13 PM  Vitals shown include unvalidated device data.  Last Pain:  Vitals:   04/15/18 1114  TempSrc: Oral  PainSc:       Patients Stated Pain Goal: 3 (04/15/18 1113)  Complications: No apparent anesthesia complications

## 2018-04-15 NOTE — Anesthesia Preprocedure Evaluation (Signed)
Anesthesia Evaluation  Patient identified by MRN, date of birth, ID band Patient awake    Reviewed: Allergy & Precautions, NPO status , Patient's Chart, lab work & pertinent test results  Airway Mallampati: II  TM Distance: >3 FB     Dental   Pulmonary former smoker,    breath sounds clear to auscultation       Cardiovascular negative cardio ROS   Rhythm:Regular Rate:Normal     Neuro/Psych Schizophrenia    GI/Hepatic Neg liver ROS, GI history noted. CG   Endo/Other  negative endocrine ROS  Renal/GU negative Renal ROS     Musculoskeletal   Abdominal   Peds  Hematology   Anesthesia Other Findings   Reproductive/Obstetrics                             Anesthesia Physical  Anesthesia Plan  ASA: III  Anesthesia Plan: General   Post-op Pain Management:    Induction: Intravenous  PONV Risk Score and Plan: 2 and Ondansetron and Midazolam  Airway Management Planned: Oral ETT  Additional Equipment:   Intra-op Plan:   Post-operative Plan: Extubation in OR  Informed Consent: I have reviewed the patients History and Physical, chart, labs and discussed the procedure including the risks, benefits and alternatives for the proposed anesthesia with the patient or authorized representative who has indicated his/her understanding and acceptance.   Dental advisory given  Plan Discussed with: CRNA and Anesthesiologist  Anesthesia Plan Comments:         Anesthesia Quick Evaluation

## 2018-04-15 NOTE — Anesthesia Procedure Notes (Signed)
Procedure Name: Intubation Date/Time: 04/15/2018 2:04 PM Performed by: Fransisca KaufmannMeyer, Windie Marasco E, CRNA Pre-anesthesia Checklist: Patient identified, Emergency Drugs available, Suction available and Patient being monitored Patient Re-evaluated:Patient Re-evaluated prior to induction Oxygen Delivery Method: Circle System Utilized Preoxygenation: Pre-oxygenation with 100% oxygen Induction Type: IV induction Ventilation: Mask ventilation without difficulty Laryngoscope Size: Miller and 2 Grade View: Grade I Tube type: Oral Tube size: 7.5 mm Number of attempts: 1 Airway Equipment and Method: Stylet and Oral airway Placement Confirmation: ETT inserted through vocal cords under direct vision,  positive ETCO2 and breath sounds checked- equal and bilateral Secured at: 23 cm Tube secured with: Tape Dental Injury: Teeth and Oropharynx as per pre-operative assessment

## 2018-04-15 NOTE — Progress Notes (Signed)
[name] [name] is staying overnight for observation s/p ORIF left tibial plateau fracture due to lack of social support at home.  He will be evaluated by PT tomorrow for home safety and likely dc home tomorrow as long as he's safe and pain is controlled.  bledsoe brace has been ordered from [employer].  [name], MD [facility] [phone] 4:10 PM

## 2018-04-16 DIAGNOSIS — S82142A Displaced bicondylar fracture of left tibia, initial encounter for closed fracture: Secondary | ICD-10-CM | POA: Diagnosis not present

## 2018-04-16 LAB — CBC
HCT: 29.2 % — ABNORMAL LOW (ref 39.0–52.0)
Hemoglobin: 9.8 g/dL — ABNORMAL LOW (ref 13.0–17.0)
MCH: 28.9 pg (ref 26.0–34.0)
MCHC: 33.6 g/dL (ref 30.0–36.0)
MCV: 86.1 fL (ref 78.0–100.0)
Platelets: 282 10*3/uL (ref 150–400)
RBC: 3.39 MIL/uL — ABNORMAL LOW (ref 4.22–5.81)
RDW: 11.7 % (ref 11.5–15.5)
WBC: 8.3 10*3/uL (ref 4.0–10.5)

## 2018-04-16 LAB — BASIC METABOLIC PANEL
Anion gap: 8 (ref 5–15)
BUN: <5 mg/dL — ABNORMAL LOW (ref 6–20)
CO2: 28 mmol/L (ref 22–32)
Calcium: 8.7 mg/dL — ABNORMAL LOW (ref 8.9–10.3)
Chloride: 100 mmol/L (ref 98–111)
Creatinine, Ser: 0.56 mg/dL — ABNORMAL LOW (ref 0.61–1.24)
GFR calc Af Amer: >60 mL/min (ref >60)
GFR calc non Af Amer: >60 mL/min (ref >60)
Glucose, Bld: 122 mg/dL — ABNORMAL HIGH (ref 70–99)
Potassium: 4.1 mmol/L (ref 3.5–5.1)
Sodium: 136 mmol/L (ref 135–145)

## 2018-04-16 NOTE — Progress Notes (Signed)
Orthopedic Tech Progress Note Patient Details:  [name] [name] 1991-03-31 [id number]  Patient ID: [name] [name], male   DOB: 1991-03-31, 27 y.o.   MRN: [id number]   [name] 04/16/2018, 9:38 AMCalled [employer] for left Bledsoe brace.

## 2018-04-16 NOTE — Evaluation (Signed)
Physical Therapy Evaluation Patient Details Name: [name] [name] MRN: [id number] DOB: Sep 26, 1990 Today's Date: 04/16/2018   History of Present Illness  Pateint suffered a tibial plateau fx on 04/11/2018. He had an ORIF perfromed on 04/15/2018. He had been home non weight bearing abnd using cruthes. PMH: Schizophrenia   Clinical Impression  Patient required guarding and cuing for the stairs but he was able to get up and down while following precautions and safely. He has 3 flights of stairs to go up at home. He may benefit from home care therapy to work on stair training.  Acute therapy will continue to follow.     Follow Up Recommendations Home health PT    Equipment Recommendations       Recommendations for Other Services       Precautions / Restrictions Precautions Precautions: None Required Braces or Orthoses: Knee Immobilizer - Right Knee Immobilizer - Right: On when out of bed or walking Restrictions Weight Bearing Restrictions: Yes LLE Weight Bearing: Non weight bearing      Mobility  Bed Mobility Overal bed mobility: Independent             General bed mobility comments: slow to sit up 2nd to pain but able to perfrom. Had some difficulty getting his left leg back in bed.   Transfers Overall transfer level: Needs assistance Equipment used: Crutches Transfers: Sit to/from Stand Sit to Stand: Supervision         General transfer comment: supervision for initial balance. Trasfered sit to stand 4x with PT. Min cuing for proper technique with the crutches  Ambulation/Gait Ambulation/Gait assistance: Min guard Gait Distance (Feet): 50 Feet         General Gait Details: able to follwo non weight bearing percautions with the crutches> Min gaurd for balance   Stairs Stairs: Yes Stairs assistance: Min guard Stair Management: One rail Right;With crutches Number of Stairs: 10 General stair comments: required mod cuing his first time aup and down but only  min cuing the second time. Min gaurd with steps  Wheelchair Mobility    Modified Rankin (Stroke Patients Only)       Balance Overall balance assessment: Needs assistance Sitting-balance support: No upper extremity supported Sitting balance-Leahy Scale: Good     Standing balance support: Bilateral upper extremity supported Standing balance-Leahy Scale: Poor                               Pertinent Vitals/Pain Pain Assessment: 0-10 Pain Score: 4  Pain Location: left knee  Pain Descriptors / Indicators: Aching Pain Intervention(s): Limited activity within patient's tolerance;Monitored during session;Repositioned    Home Living Family/patient expects to be discharged to:: Private residence Living Arrangements: Other relatives Available Help at Discharge: Family Type of Home: House Home Access: Stairs to enter Entrance Stairs-Rails: Right Entrance Stairs-Number of Steps: 3 flights          Prior Function Level of Independence: Independent               Hand Dominance   Dominant Hand: Right    Extremity/Trunk Assessment   Upper Extremity Assessment Upper Extremity Assessment: Overall WFL for tasks assessed    Lower Extremity Assessment Lower Extremity Assessment: LLE deficits/detail LLE: Unable to fully assess due to immobilization;Unable to fully assess due to pain LLE Sensation: WNL LLE Coordination: WNL    Cervical / Trunk Assessment Cervical / Trunk Assessment: Normal  Communication   Communication: No  difficulties  Cognition Arousal/Alertness: Awake/alert Behavior During Therapy: WFL for tasks assessed/performed Overall Cognitive Status: Within Functional Limits for tasks assessed                                        General Comments      Exercises     Assessment/Plan    PT Assessment Patient needs continued PT services  PT Problem List Decreased strength;Decreased range of motion;Decreased activity  tolerance;Decreased balance       PT Treatment Interventions DME instruction;Gait training;Stair training;Functional mobility training;Therapeutic exercise;Therapeutic activities    PT Goals (Current goals can be found in the Care Plan section)  Acute Rehab PT Goals Patient Stated Goal: to go home  PT Goal Formulation: With patient Time For Goal Achievement: 04/23/18 Potential to Achieve Goals: Good    Frequency Min 4X/week   Barriers to discharge Inaccessible home environment;Decreased caregiver support Patient has a brotherat home but he is unsure how much he can help. He also has three flights of stairs but he was getting in and out prior to the surgery.     Co-evaluation               AM-PAC PT "6 Clicks" Daily Activity  Outcome Measure Difficulty turning over in bed (including adjusting bedclothes, sheets and blankets)?: A Little Difficulty moving from lying on back to sitting on the side of the bed? : A Little Difficulty sitting down on and standing up from a chair with arms (e.g., wheelchair, bedside commode, etc,.)?: A Little Help needed moving to and from a bed to chair (including a wheelchair)?: A Little Help needed walking in hospital room?: A Little Help needed climbing 3-5 steps with a railing? : A Little 6 Click Score: 18    End of Session Equipment Utilized During Treatment: Gait belt Activity Tolerance: Patient tolerated treatment well Patient left: in bed;with call bell/phone within reach Nurse Communication: Mobility status PT Visit Diagnosis: Unsteadiness on feet (R26.81);Pain;Difficulty in walking, not elsewhere classified (R26.2) Pain - Right/Left: Left Pain - part of body: Knee    Time: 1000-1021 PT Time Calculation (min) (ACUTE ONLY): 21 min   Charges:   PT Evaluation $PT Eval Low Complexity: 1 Low           Dessie ComaDavid J Yvonna Brun 04/16/2018, 1:10 PM

## 2018-04-17 DIAGNOSIS — S82142A Displaced bicondylar fracture of left tibia, initial encounter for closed fracture: Secondary | ICD-10-CM | POA: Diagnosis not present

## 2018-04-17 NOTE — Progress Notes (Addendum)
   Subjective: 2 Days Post-Op Procedure(s) (LRB): OPEN REDUCTION INTERNAL FIXATION (ORIF) LEFT BICONDYLAR TIBIAL PLATEAU (Left) Patient reports pain as mild.    Objective: Vital signs in last 24 hours: Temp:  [98.2 F (36.8 C)-98.7 F (37.1 C)] 98.7 F (37.1 C) (07/28 0457) Pulse Rate:  [95-110] 95 (07/28 0457) Resp:  [17-20] 17 (07/28 0457) BP: (119-128)/(78-86) 119/78 (07/28 0457) SpO2:  [99 %-100 %] 100 % (07/28 0457)  Intake/Output from previous day: 07/27 0701 - 07/28 0700 In: 130 [I.V.:130] Out: 1650 [Urine:1650] Intake/Output this shift: No intake/output data recorded.  Recent Labs    04/15/18 1136 04/16/18 0348  HGB 11.1* 9.8*   Recent Labs    04/15/18 1136 04/16/18 0348  WBC 5.5 8.3  RBC 3.80* 3.39*  HCT 33.8* 29.2*  PLT 288 282   Recent Labs    04/16/18 0348  NA 136  K 4.1  CL 100  CO2 28  BUN <5*  CREATININE 0.56*  GLUCOSE 122*  CALCIUM 8.7*   No results for input(s): LABPT, INR in the last 72 hours.  Neurologically intact No results found.  Assessment/Plan: 2 Days Post-Op Procedure(s) (LRB): OPEN REDUCTION INTERNAL FIXATION (ORIF) LEFT BICONDYLAR TIBIAL PLATEAU (Left) Plan: discharge home today, after he works with therapy  Eldred MangesMark C Sadiya Durand 04/17/2018, 8:36 AM

## 2018-04-17 NOTE — Progress Notes (Signed)
Pt discharged to home in stable condition.  All discharge instructions reviewed with and given to pt.  3N1 and crutches taken along with belongings.  Pt transported via wheelchair with transporters x 1 at chairside.  [id number]

## 2018-04-17 NOTE — Progress Notes (Signed)
Physical Therapy Treatment Patient Details Name: [name] [name] MRN: [id number] DOB: 1991/05/05 Today's Date: 04/17/2018    History of Present Illness Pateint suffered a tibial plateau fx on 04/11/2018. He had an ORIF perfromed on 04/15/2018. He had been home non weight bearing abnd using cruthes. PMH: Schizophrenia     PT Comments    Session focused extensively on stair training for safe home entry. Pt able to ambulate stairs with close supervision, however mod cues needed with questionable cary over for sequencing, written reminder given to patient to ensure safety. Pt states his brother can help guard him upon entry, feel HHPT will be beneficial to continue to reinforce safety and functional mobility after discharge.     Follow Up Recommendations  Home health PT     Equipment Recommendations  Crutches(pt has DME needs met)    Recommendations for Other Services       Precautions / Restrictions Precautions Precautions: None Required Braces or Orthoses: Knee Immobilizer - Left Knee Immobilizer - Right: On when out of bed or walking Restrictions Weight Bearing Restrictions: Yes LLE Weight Bearing: Non weight bearing    Mobility  Bed Mobility Overal bed mobility: Modified Independent                Transfers Overall transfer level: Needs assistance Equipment used: Crutches Transfers: Sit to/from Stand Sit to Stand: Supervision         General transfer comment: Pt transfering with crutches with supervision, no overt LOB  Ambulation/Gait Ambulation/Gait assistance: Supervision Gait Distance (Feet): 100 Feet Assistive device: Crutches Gait Pattern/deviations: Step-through pattern     General Gait Details: good adherence to NWB during gait with crutches, no overt LOB but mild unsteadyiness noted. readjusted height    Stairs Stairs: Yes Stairs assistance: Min guard;Supervision Stair Management: One rail Right;With crutches Number of Stairs: 24 General  stair comments: extensive focus on stairs and safety. mod cueing required for sequencing to reinfroce, written handout given with sequencing as patient apepared to struggle with cary over. Rec HHPT to cont to reinfroce stair safety as pt lives  up 3 stories   Wheelchair Mobility    Modified Rankin (Stroke Patients Only)       Balance Overall balance assessment: Needs assistance Sitting-balance support: No upper extremity supported Sitting balance-Leahy Scale: Good     Standing balance support: Bilateral upper extremity supported Standing balance-Leahy Scale: Poor                              Cognition Arousal/Alertness: Awake/alert Behavior During Therapy: WFL for tasks assessed/performed Overall Cognitive Status: Within Functional Limits for tasks assessed                                        Exercises      General Comments        Pertinent Vitals/Pain Pain Assessment: Faces Faces Pain Scale: Hurts little more Pain Location: left knee  Pain Descriptors / Indicators: Aching Pain Intervention(s): Limited activity within patient's tolerance;Monitored during session    Home Living                      Prior Function            PT Goals (current goals can now be found in the care plan section) Acute Rehab PT Goals Patient Stated  Goal: to go home  PT Goal Formulation: With patient Time For Goal Achievement: 04/23/18 Potential to Achieve Goals: Good Progress towards PT goals: Progressing toward goals    Frequency    Min 4X/week      PT Plan Current plan remains appropriate    Co-evaluation              AM-PAC PT "6 Clicks" Daily Activity  Outcome Measure  Difficulty turning over in bed (including adjusting bedclothes, sheets and blankets)?: A Little Difficulty moving from lying on back to sitting on the side of the bed? : A Little Difficulty sitting down on and standing up from a chair with arms (e.g.,  wheelchair, bedside commode, etc,.)?: A Little Help needed moving to and from a bed to chair (including a wheelchair)?: A Little Help needed walking in hospital room?: A Little Help needed climbing 3-5 steps with a railing? : A Little 6 Click Score: 18    End of Session Equipment Utilized During Treatment: Gait belt Activity Tolerance: Patient tolerated treatment well Patient left: in bed;with call bell/phone within reach Nurse Communication: Mobility status PT Visit Diagnosis: Unsteadiness on feet (R26.81);Pain;Difficulty in walking, not elsewhere classified (R26.2) Pain - Right/Left: Left Pain - part of body: Knee     Time: 1003-4961 PT Time Calculation (min) (ACUTE ONLY): 34 min  Charges:  $Gait Training: 8-22 mins                     Reinaldo Berber, PT, DPT Acute Rehab Services Pager: [phone]     Reinaldo Berber 04/17/2018, 11:22 AM

## 2018-04-17 NOTE — Progress Notes (Signed)
Clarified w Dr Ophelia CharterYates, No HH needed. OK to DC to home when 3/1 delivered to room.

## 2018-04-18 NOTE — Anesthesia Postprocedure Evaluation (Signed)
Anesthesia Post Note  Patient: [name] SessionsMaurice [name]  Procedure(s) Performed: OPEN REDUCTION INTERNAL FIXATION (ORIF) LEFT BICONDYLAR TIBIAL PLATEAU (Left Knee)     Patient location during evaluation: PACU Anesthesia Type: General Level of consciousness: awake and alert Pain management: pain level controlled Vital Signs Assessment: post-procedure vital signs reviewed and stable Respiratory status: spontaneous breathing, nonlabored ventilation and respiratory function stable Cardiovascular status: blood pressure returned to baseline and stable Postop Assessment: no apparent nausea or vomiting Anesthetic complications: no    Last Vitals:  Vitals:   04/17/18 0457 04/17/18 1431  BP: 119/78 107/67  Pulse: 95 88  Resp: 17 17  Temp: 37.1 C 37.1 C  SpO2: 100% 100%    Last Pain:  Vitals:   04/17/18 1907  TempSrc:   PainSc: 0-No pain                 Lowella CurbWarren Ray Agam Tuohy

## 2018-04-19 ENCOUNTER — Ambulatory Visit (INDEPENDENT_AMBULATORY_CARE_PROVIDER_SITE_OTHER): Payer: Self-pay | Admitting: Orthopaedic Surgery

## 2018-04-22 ENCOUNTER — Encounter (HOSPITAL_COMMUNITY): Payer: Self-pay | Admitting: Orthopaedic Surgery

## 2018-04-22 NOTE — Discharge Summary (Signed)
Patient ID: [name] [name] MRN: [id number] DOB/AGE: Jul 11, 1991 26 y.o.  Admit date: 04/15/2018 Discharge date: 04/22/2018  Admission Diagnoses:  Active Problems:   History of open reduction and internal fixation (ORIF) procedure   Discharge Diagnoses:  Same  Past Medical History:  Diagnosis Date  . Schizophrenia (HCC)   . Tibial plateau fracture, left     Surgeries: Procedure(s): OPEN REDUCTION INTERNAL FIXATION (ORIF) LEFT BICONDYLAR TIBIAL PLATEAU on 04/15/2018   Consultants:   Discharged Condition: Improved  Hospital Course: [name] SessionsMaurice Bobb is an 27 y.o. male who was admitted 04/15/2018 for operative treatment of<principal problem not specified>. Patient has severe unremitting pain that affects sleep, daily activities, and work/hobbies. After pre-op clearance the patient was taken to the operating room on 04/15/2018 and underwent  Procedure(s): OPEN REDUCTION INTERNAL FIXATION (ORIF) LEFT BICONDYLAR TIBIAL PLATEAU.    Patient was given perioperative antibiotics:  Anti-infectives (From admission, onward)   Start     Dose/Rate Route Frequency Ordered Stop   04/15/18 2000  ceFAZolin (ANCEF) IVPB 2g/100 mL premix     2 g 200 mL/hr over 30 Minutes Intravenous Every 6 hours 04/15/18 1722 04/16/18 0917   04/15/18 1538  vancomycin (VANCOCIN) powder  Status:  Discontinued       As needed 04/15/18 1538 04/15/18 1609   04/15/18 1100  ceFAZolin (ANCEF) IVPB 2g/100 mL premix     2 g 200 mL/hr over 30 Minutes Intravenous On call to O.R. 04/15/18 1049 04/15/18 1415       Patient was given sequential compression devices, early ambulation, and chemoprophylaxis to prevent DVT.  Patient benefited maximally from hospital stay and there were no complications.    Recent vital signs: No data found.   Recent laboratory studies: No results for input(s): WBC, HGB, HCT, PLT, NA, K, CL, CO2, BUN, CREATININE, GLUCOSE, INR, CALCIUM in the last 72 hours.  Invalid input(s): PT, 2   Discharge  Medications:   Allergies as of 04/17/2018   No Known Allergies     Medication List    STOP taking these medications   oxyCODONE-acetaminophen 5-325 MG tablet Commonly known as:  PERCOCET/ROXICET     TAKE these medications   aspirin EC 81 MG tablet Take 1 tablet (81 mg total) by mouth 2 (two) times daily.   calcium-vitamin D 500-200 MG-UNIT tablet Commonly known as:  OSCAL WITH D Take 1 tablet by mouth 3 (three) times daily.   methocarbamol 750 MG tablet Commonly known as:  ROBAXIN Take 1 tablet (750 mg total) by mouth 2 (two) times daily as needed for muscle spasms.   ondansetron 4 MG tablet Commonly known as:  ZOFRAN Take 1-2 tablets (4-8 mg total) by mouth every 8 (eight) hours as needed for nausea or vomiting.   oxyCODONE 5 MG immediate release tablet Commonly known as:  Oxy IR/ROXICODONE Take 1-3 tablets (5-15 mg total) by mouth every 4 (four) hours as needed.   promethazine 25 MG tablet Commonly known as:  PHENERGAN Take 1 tablet (25 mg total) by mouth every 6 (six) hours as needed for nausea.   senna-docusate 8.6-50 MG tablet Commonly known as:  SENOKOT S Take 1 tablet by mouth at bedtime as needed.   zinc sulfate 220 (50 Zn) MG capsule Take 1 capsule (220 mg total) by mouth daily.       Diagnostic Studies: Ct Knee Left Wo Contrast  Result Date: 04/11/2018 CLINICAL DATA:  Evaluate fracture seen on plain films. EXAM: CT OF THE left KNEE WITHOUT CONTRAST TECHNIQUE: Multidetector  CT imaging of the left knee was performed according to the standard protocol. Multiplanar CT image reconstructions were also generated. COMPARISON:  Left knee radiographs 04/11/2018 FINDINGS: Bones/Joint/Cartilage Comminuted fractures demonstrated in the proximal left tibia involving the medial and lateral tibial plateau with extension to the medial metaphysis. There is mild impaction of fracture fragments with mild posterior displacement of posterior tibial fragments. Displaced fragment of  the posterior tibial spine. Tiny intra-articular fragments demonstrated in the medial compartment. There is an approximately 11 mm cortical gap in the medial tibial plateau. There is approximately 5 mm depression of the lateral tibial plateau fragments. The visualized distal femur and proximal fibula appear intact. Large left knee effusion with fat fluid level consistent with hemarthrosis. There is a tiny amount of air within the suprapatellar bursa suggesting an open component to the fracture. Ligaments Suboptimally assessed by CT. Muscles and Tendons No intramuscular hematoma or collection identified. Soft tissues Calcifications in the anterior soft tissues probably represent phleboliths. Mild soft tissue edema anterior to the patella. IMPRESSION: Comminuted fractures of the proximal left tibia involving the medial and lateral tibial plateau with extension to the medial metaphysis. Tiny intra-articular fragments in the medial compartment. Large left knee effusion with hemarthrosis. Electronically Signed   By: Burman Nieves M.D.   On: 04/11/2018 02:42   Dg Knee Complete 4 Views Left  Result Date: 04/11/2018 CLINICAL DATA:  Left knee pain after jumping off a brick wall. Unable to bear weight. EXAM: LEFT KNEE - COMPLETE 4+ VIEW COMPARISON:  None. FINDINGS: Comminuted fractures demonstrated in the proximal left tibia involving medial and lateral tibial plateau and proximal tibial metaphysis. There is impaction of fracture fragments. There appears to be a displaced fracture fragment in the inter trochlear region. Large left knee effusion with hemarthrosis. IMPRESSION: Comminuted fractures of the proximal left tibia involving the medial and lateral tibial plateau and proximal tibial metaphysis. Displaced fracture fragment in the inter trochlear region. Large left knee effusion. Electronically Signed   By: Burman Nieves M.D.   On: 04/11/2018 01:10   Dg C-arm 1-60 Min  Result Date: 04/15/2018 CLINICAL DATA:   ORIF of left tibial fracture EXAM: LEFT KNEE - 3 VIEW; DG C-ARM 61-120 MIN COMPARISON:  04/11/2018 FLUOROSCOPY TIME:  Fluoroscopy Time:  Not available Radiation Exposure Index (if provided by the fluoroscopic device): Not available Number of Acquired Spot Images: 2 FINDINGS: Fixation sideplate is noted along the medial aspect of the proximal tibia. Multiple fixation screws are noted. Fracture fragments are in near anatomic alignment. IMPRESSION: Status post ORIF of proximal left tibial fracture Electronically Signed   By: Alcide Clever M.D.   On: 04/15/2018 15:37   Dg C-arm 1-60 Min  Result Date: 04/15/2018 CLINICAL DATA:  ORIF of left tibial fracture EXAM: LEFT KNEE - 3 VIEW; DG C-ARM 61-120 MIN COMPARISON:  04/11/2018 FLUOROSCOPY TIME:  Fluoroscopy Time:  Not available Radiation Exposure Index (if provided by the fluoroscopic device): Not available Number of Acquired Spot Images: 2 FINDINGS: Fixation sideplate is noted along the medial aspect of the proximal tibia. Multiple fixation screws are noted. Fracture fragments are in near anatomic alignment. IMPRESSION: Status post ORIF of proximal left tibial fracture Electronically Signed   By: Alcide Clever M.D.   On: 04/15/2018 15:37   Dg Knee 2 Views Left  Result Date: 04/15/2018 CLINICAL DATA:  ORIF of left tibial fracture EXAM: LEFT KNEE - 3 VIEW; DG C-ARM 61-120 MIN COMPARISON:  04/11/2018 FLUOROSCOPY TIME:  Fluoroscopy Time:  Not available  Radiation Exposure Index (if provided by the fluoroscopic device): Not available Number of Acquired Spot Images: 2 FINDINGS: Fixation sideplate is noted along the medial aspect of the proximal tibia. Multiple fixation screws are noted. Fracture fragments are in near anatomic alignment. IMPRESSION: Status post ORIF of proximal left tibial fracture Electronically Signed   By: Alcide Clever M.D.   On: 04/15/2018 15:37    Disposition:     Follow-up Information    Tarry Kos, MD In 2 weeks.   Specialty:  Orthopedic  Surgery Why:  For suture removal, For wound re-check Contact information: [address] [place] [address] [phone]        [facility] - Dme Follow up.   Why:  3/1 to be delivered to room prior to DC Contact information: [address] [place] [address] [phone]            Signed: Cristie Hem 04/22/2018, 7:59 AM

## 2018-04-29 ENCOUNTER — Encounter (INDEPENDENT_AMBULATORY_CARE_PROVIDER_SITE_OTHER): Payer: Self-pay | Admitting: Orthopaedic Surgery

## 2018-04-29 ENCOUNTER — Ambulatory Visit (INDEPENDENT_AMBULATORY_CARE_PROVIDER_SITE_OTHER): Payer: Self-pay

## 2018-04-29 ENCOUNTER — Ambulatory Visit (INDEPENDENT_AMBULATORY_CARE_PROVIDER_SITE_OTHER): Payer: Self-pay | Admitting: Orthopaedic Surgery

## 2018-04-29 DIAGNOSIS — S82142A Displaced bicondylar fracture of left tibia, initial encounter for closed fracture: Secondary | ICD-10-CM

## 2018-04-29 NOTE — Progress Notes (Signed)
   Post-Op Visit Note   Patient: [name] [name]           Date of Birth: 09-01-1991           MRN: [id number] Visit Date: 04/29/2018 PCP: System, Pcp Not In   Assessment & Plan:  Chief Complaint:  Chief Complaint  Patient presents with  . Left Knee - Pain, Routine Post Op   Visit Diagnoses:  1. Displaced bicondylar fracture of left tibia, initial encounter for closed fracture     Plan: [name] [name] is 2-week status post ORIF left bicondylar tibial plateau fracture.  Reports mild pain.  He is doing well overall.  He is ambulating with crutches.  Surgical incision has.  The sutures were removed today.  At this point continue with range of motion as tolerated.  Continue nonweightbearing with crutches.  Follow-up in 4 weeks with 2 view x-rays left knee.  Follow-Up Instructions: Return in about 4 weeks (around 05/27/2018).   Orders:  Orders Placed This Encounter  Procedures  . XR KNEE 3 VIEW LEFT   No orders of the defined types were placed in this encounter.   Imaging: Xr Knee 3 View Left  Result Date: 04/29/2018 Stable fixation and alignment of tibial plateau fracture without complications.   PMFS History: Patient Active Problem List   Diagnosis Date Noted  . History of open reduction and internal fixation (ORIF) procedure 04/15/2018  . Auditory hallucination   . S/P appendectomy 07/16/2017  . 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 Schizophrenia (HCC)   . Tibial plateau fracture, left     Family History  Problem Relation Age of Onset  . Other Mother   . Other Brother     Past Surgical History:  Procedure Laterality Date  . LAPAROSCOPIC APPENDECTOMY N/A 07/16/2017   Procedure: APPENDECTOMY LAPAROSCOPIC;  Surgeon: Axel Filleramirez, Armando, MD;  [facility];  Service: General;  Laterality: N/A;  . LAPAROTOMY N/A 07/16/2017   Procedure: POSSIBLE OPEN  LAPAROTOMY;   Surgeon: Axel Filleramirez, Armando, MD;  [facility];  Service: General;  Laterality: N/A;  . NO PAST SURGERIES    . ORIF TIBIA PLATEAU Left 04/15/2018   Procedure: OPEN REDUCTION INTERNAL FIXATION (ORIF) LEFT BICONDYLAR TIBIAL PLATEAU;  Surgeon: Tarry KosXu, Rajvir Ernster M, MD;  Location: [facility] OR;  Service: Orthopedics;  Laterality: Left;   Social History   Occupational History  . Not on file  Tobacco Use  . Smoking status: Former Smoker    Types: Cigarettes    Last attempt to quit: 09/21/2016    Years since quitting: 1.6  . Smokeless tobacco: Never Used  Substance and Sexual Activity  . Alcohol use: No    Frequency: Never  . Drug use: Yes    Types: Marijuana    Comment: denies use of marijuana   . Sexual activity: Not on file

## 2018-07-31 IMAGING — CT CT KNEE*L* W/O CM
3 series · 13 of 33 positions shown, 16 images · non-contrast
Comparison: Left knee radiographs 04/11/2018

CLINICAL DATA: Evaluate fracture seen on plain films.

EXAM:
CT OF THE left KNEE WITHOUT CONTRAST
TECHNIQUE: Multidetector CT imaging of the left knee was performed according to
the standard protocol. Multiplanar CT image reconstructions were
also generated.

[Series 4: axial st · axial · 0.38mm/px · z∈[+318,+452]mm · 5 of 132 slices shown, 7 images]
[im 21/132  soft-tissue]
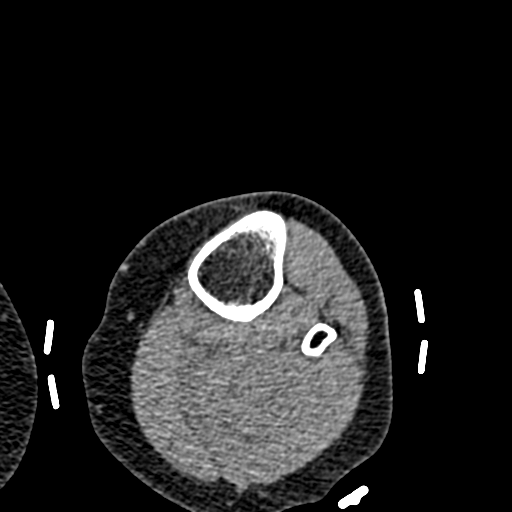
[im 21/132  bone]
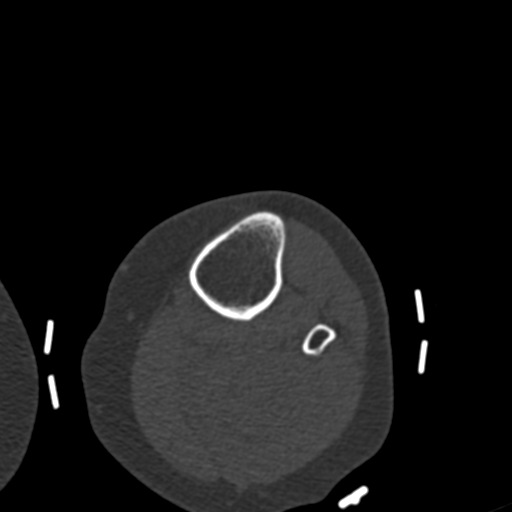
[im 41/132  bone]
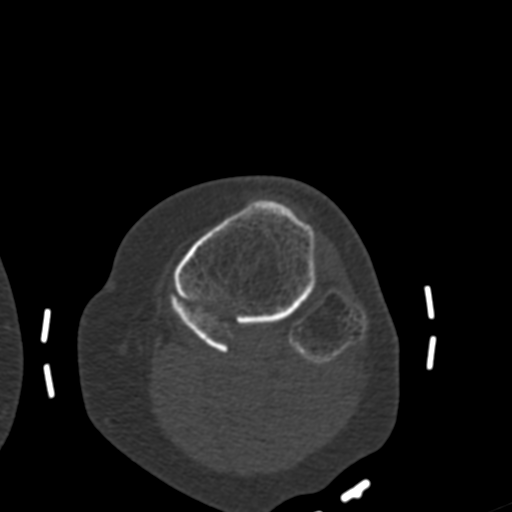
[im 71/132  bone]
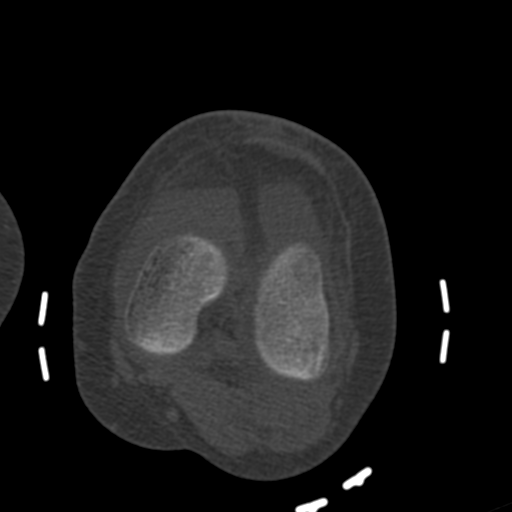
[im 91/132  bone]
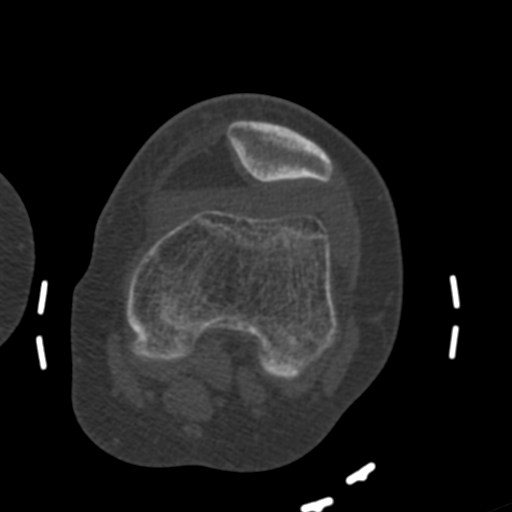
[im 111/132  soft-tissue]
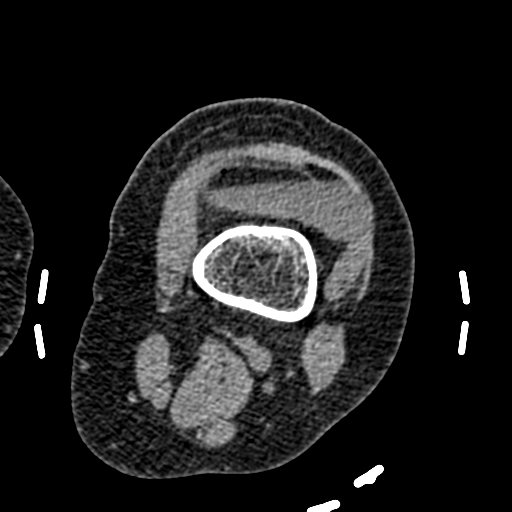
[im 111/132  bone]
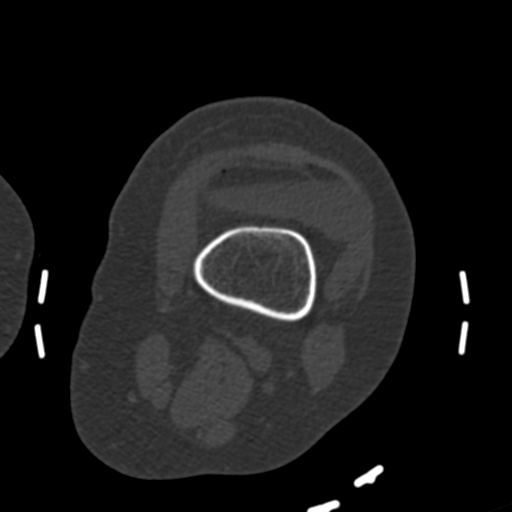

[Series 7: coronal st · coronal · 0.29mm/px · 3 of 103 slices shown]
[im 21/103  bone]
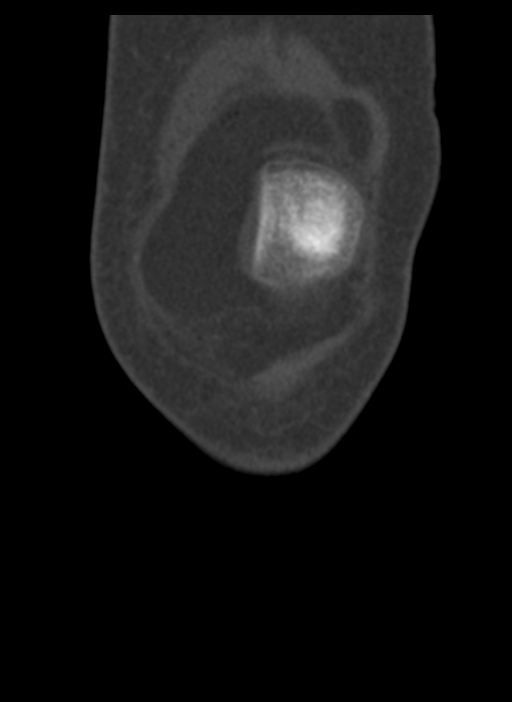
[im 41/103  bone]
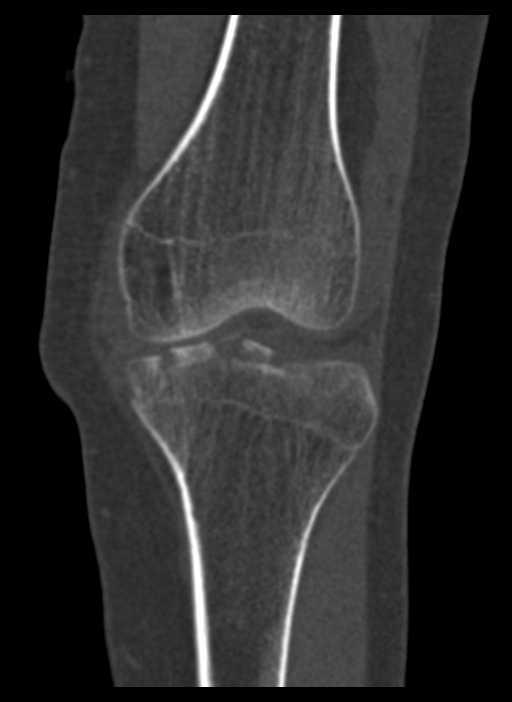
[im 62/103  bone]
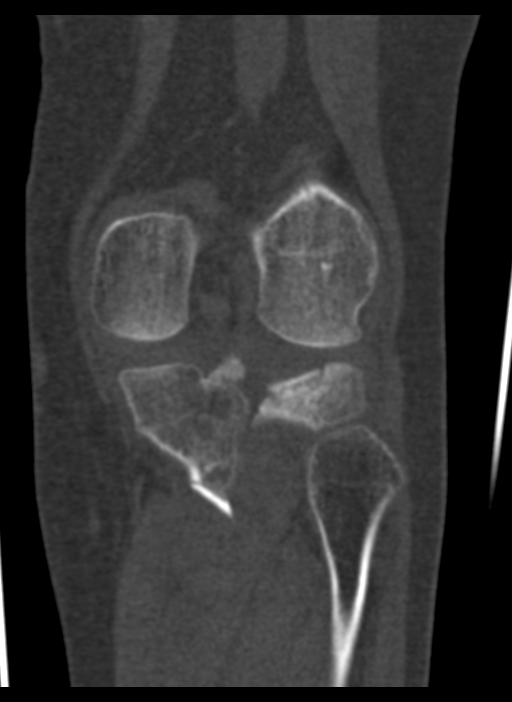

[Series 8: sagittal st · sagittal · 0.30mm/px · 5 of 75 slices shown, 6 images]
[im 25/75  bone]
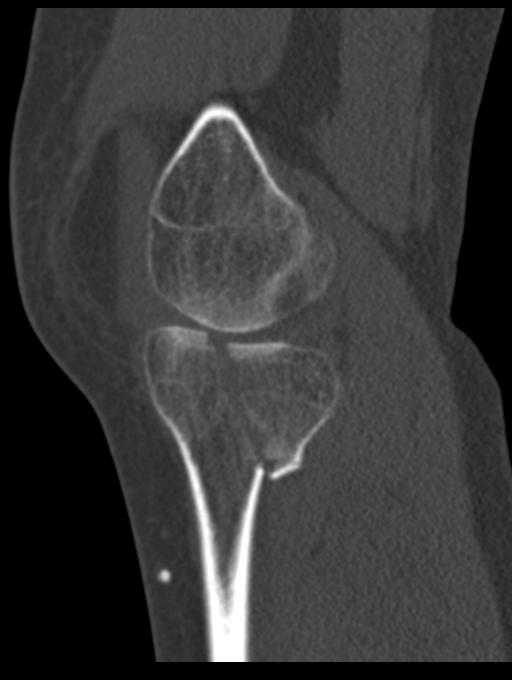
[im 31/75  bone]
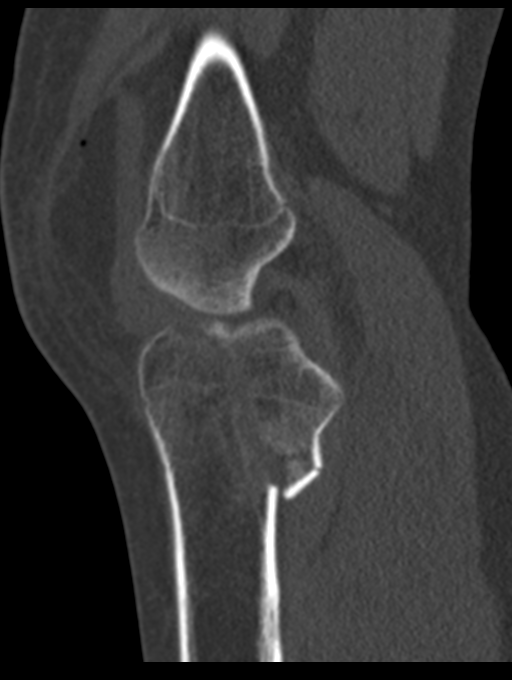
[im 38/75  soft-tissue]
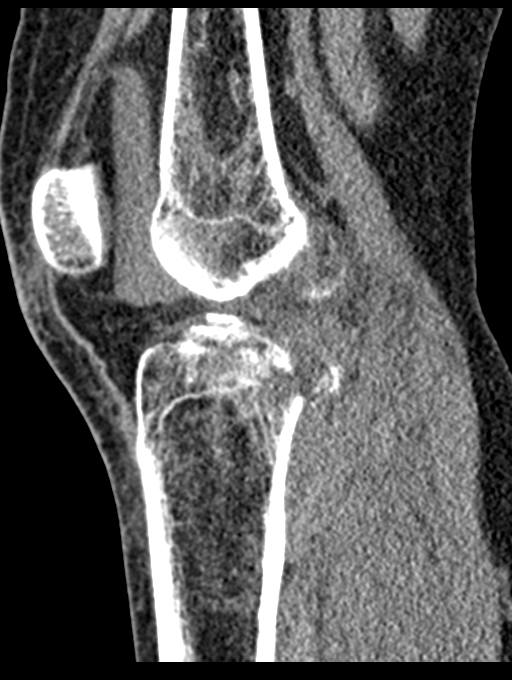
[im 38/75  bone]
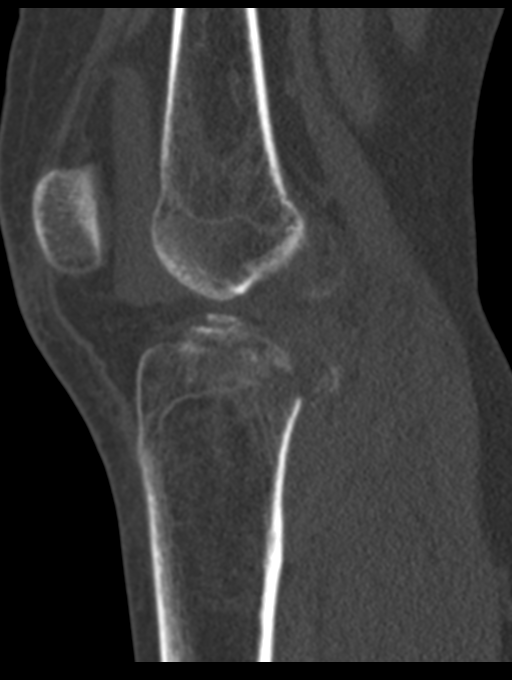
[im 44/75  bone]
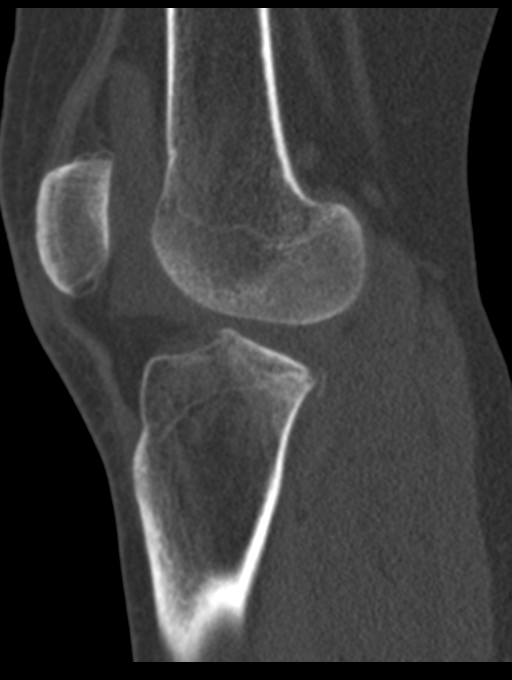
[im 50/75  bone]
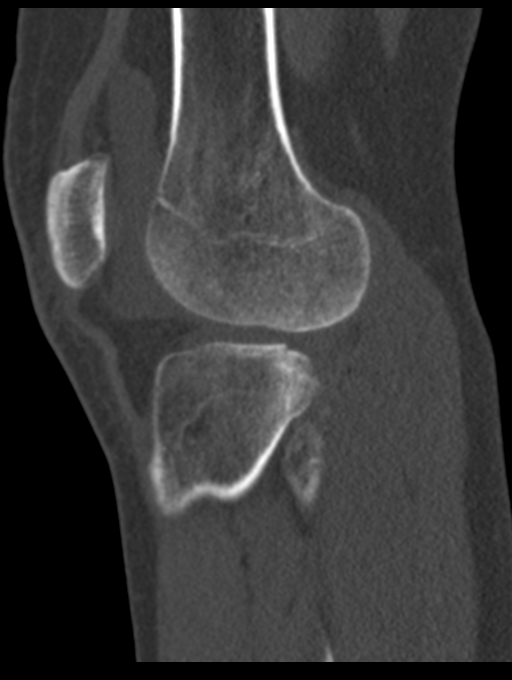

[13 of 33 positions shown; findings below may reference images not displayed]

FINDINGS: Bones/Joint/Cartilage

Comminuted fractures demonstrated in the proximal left tibia
involving the medial and lateral tibial plateau with extension to
the medial metaphysis. There is mild impaction of fracture fragments
with mild posterior displacement of posterior tibial fragments.
Displaced fragment of the posterior tibial spine. Tiny
intra-articular fragments demonstrated in the medial compartment.
There is an approximately 11 mm cortical gap in the medial tibial
plateau. There is approximately 5 mm depression of the lateral
tibial plateau fragments. The visualized distal femur and proximal
fibula appear intact. Large left knee effusion with fat fluid level
consistent with hemarthrosis. There is a tiny amount of air within
the suprapatellar bursa suggesting an open component to the
fracture.

Ligaments

Suboptimally assessed by CT.

Muscles and Tendons

No intramuscular hematoma or collection identified.

Soft tissues

Calcifications in the anterior soft tissues probably represent
phleboliths. Mild soft tissue edema anterior to the patella.
IMPRESSION: Comminuted fractures of the proximal left tibia involving the medial
and lateral tibial plateau with extension to the medial metaphysis.
Tiny intra-articular fragments in the medial compartment. Large left
knee effusion with hemarthrosis.

## 2018-09-03 ENCOUNTER — Emergency Department (HOSPITAL_COMMUNITY)
Admission: EM | Admit: 2018-09-03 | Discharge: 2018-09-03 | Disposition: A | Discharge status: Home / Self Care | Payer: Self-pay | Attending: Emergency Medicine | Admitting: Emergency Medicine

## 2018-09-03 DIAGNOSIS — R4689 Other symptoms and signs involving appearance and behavior: Secondary | ICD-10-CM | POA: Insufficient documentation

## 2018-09-03 DIAGNOSIS — F209 Schizophrenia, unspecified: Secondary | ICD-10-CM | POA: Insufficient documentation

## 2018-09-03 DIAGNOSIS — Z7982 Long term (current) use of aspirin: Secondary | ICD-10-CM | POA: Insufficient documentation

## 2018-09-03 DIAGNOSIS — Z79899 Other long term (current) drug therapy: Secondary | ICD-10-CM | POA: Insufficient documentation

## 2018-09-03 DIAGNOSIS — Z87891 Personal history of nicotine dependence: Secondary | ICD-10-CM | POA: Insufficient documentation

## 2018-09-03 LAB — ACETAMINOPHEN LEVEL: Acetaminophen (Tylenol), Serum: <10 ug/mL — ABNORMAL LOW (ref 10–30)

## 2018-09-03 LAB — COMPREHENSIVE METABOLIC PANEL
ALT: 19 U/L (ref 0–44)
AST: 20 U/L (ref 15–41)
Albumin: 4.1 g/dL (ref 3.5–5.0)
Alkaline Phosphatase: 165 U/L — ABNORMAL HIGH (ref 38–126)
Anion gap: 11 (ref 5–15)
BUN: 17 mg/dL (ref 6–20)
CO2: 23 mmol/L (ref 22–32)
Calcium: 9 mg/dL (ref 8.9–10.3)
Chloride: 102 mmol/L (ref 98–111)
Creatinine, Ser: 0.62 mg/dL (ref 0.61–1.24)
GFR calc Af Amer: >60 mL/min (ref >60)
GFR calc non Af Amer: >60 mL/min (ref >60)
Glucose, Bld: 97 mg/dL (ref 70–99)
Potassium: 4.2 mmol/L (ref 3.5–5.1)
Sodium: 136 mmol/L (ref 135–145)
Total Bilirubin: 0.4 mg/dL (ref 0.3–1.2)
Total Protein: 7 g/dL (ref 6.5–8.1)

## 2018-09-03 LAB — RAPID URINE DRUG SCREEN, HOSP PERFORMED
Amphetamines: NOT DETECTED
Barbiturates: NOT DETECTED
Benzodiazepines: NOT DETECTED
Cocaine: NOT DETECTED
Opiates: NOT DETECTED
Tetrahydrocannabinol: NOT DETECTED

## 2018-09-03 LAB — CBC
HCT: 37.8 % — ABNORMAL LOW (ref 39.0–52.0)
Hemoglobin: 12.1 g/dL — ABNORMAL LOW (ref 13.0–17.0)
MCH: 27.3 pg (ref 26.0–34.0)
MCHC: 32 g/dL (ref 30.0–36.0)
MCV: 85.3 fL (ref 80.0–100.0)
Platelets: 315 10*3/uL (ref 150–400)
RBC: 4.43 MIL/uL (ref 4.22–5.81)
RDW: 13.7 % (ref 11.5–15.5)
WBC: 6.6 10*3/uL (ref 4.0–10.5)
nRBC: 0 % (ref 0.0–0.2)

## 2018-09-03 LAB — SALICYLATE LEVEL: Salicylate Lvl: <7 mg/dL (ref 2.8–30.0)

## 2018-09-03 LAB — ETHANOL: Alcohol, Ethyl (B): <10 mg/dL (ref <10)

## 2018-09-03 NOTE — ED Provider Notes (Signed)
[facility] Provider Note   CSN: [id number] Arrival date & time: 09/03/18  0007     History   Chief Complaint Chief Complaint  Patient presents with  . Medical Clearance    HPI [name] [name] is a 27 y.o. male.  Patient states he got frustrated with a video game he was playing he punched a wall multiple times. His father suggested he come to the emergency room to get help with his anger issues. Has h/o hallucinations, but none now. No SI/HI. No obvious delusional or paranoid behavior.   The history is provided by the patient.    Past Medical History:  Diagnosis Date  . Schizophrenia (HCC)   . Tibial plateau fracture, left     Patient Active Problem List   Diagnosis Date Noted  . History of open reduction and internal fixation (ORIF) procedure 04/15/2018  . Auditory hallucination   . S/P appendectomy 07/16/2017  . 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 LAPAROSCOPIC APPENDECTOMY N/A 07/16/2017   Procedure: APPENDECTOMY LAPAROSCOPIC;  Surgeon: Axel Filleramirez, Armando, MD;  [facility];  Service: General;  Laterality: N/A;  . LAPAROTOMY N/A 07/16/2017   Procedure: POSSIBLE OPEN  LAPAROTOMY;  Surgeon: Axel Filleramirez, Armando, MD;  [facility];  Service: General;  Laterality: N/A;  . NO PAST SURGERIES    . ORIF TIBIA PLATEAU Left 04/15/2018   Procedure: OPEN REDUCTION INTERNAL FIXATION (ORIF) LEFT BICONDYLAR TIBIAL PLATEAU;  Surgeon: Tarry KosXu, Naiping M, MD;  Location: [facility] OR;  Service: Orthopedics;  Laterality: Left;        Home Medications    Prior to Admission medications   Medication Sig Start Date End Date Taking? Authorizing Provider  aspirin EC 81 MG tablet Take 1 tablet (81 mg total) by mouth 2 (two) times daily. 04/15/18   Tarry KosXu, Naiping M, MD  calcium-vitamin D (OSCAL WITH D) 500-200 MG-UNIT tablet Take 1 tablet  by mouth 3 (three) times daily. 04/15/18   Tarry KosXu, Naiping M, MD  methocarbamol (ROBAXIN) 750 MG tablet Take 1 tablet (750 mg total) by mouth 2 (two) times daily as needed for muscle spasms. 04/15/18   Tarry KosXu, Naiping M, MD  ondansetron (ZOFRAN) 4 MG tablet Take 1-2 tablets (4-8 mg total) by mouth every 8 (eight) hours as needed for nausea or vomiting. 04/15/18   Tarry KosXu, Naiping M, MD  oxyCODONE (OXY IR/ROXICODONE) 5 MG immediate release tablet Take 1-3 tablets (5-15 mg total) by mouth every 4 (four) hours as needed. 04/15/18   Tarry KosXu, Naiping M, MD  promethazine (PHENERGAN) 25 MG tablet Take 1 tablet (25 mg total) by mouth every 6 (six) hours as needed for nausea. 04/15/18   Tarry KosXu, Naiping M, MD  senna-docusate (SENOKOT S) 8.6-50 MG tablet Take 1 tablet by mouth at bedtime as needed. 04/15/18   Tarry KosXu, Naiping M, MD  zinc sulfate 220 (50 Zn) MG capsule Take 1 capsule (220 mg total) by mouth daily. 04/15/18   Tarry KosXu, Naiping M, MD    Family History Family History  Problem Relation Age of Onset  . Other Mother   . Other Brother     Social History Social History   Tobacco Use  . Smoking status: Former Smoker    Types: Cigarettes    Last attempt to quit: 09/21/2016    Years since quitting: 1.9  . Smokeless tobacco: Never Used  Substance Use Topics  . Alcohol  use: No    Frequency: Never  . Drug use: Yes    Types: Marijuana    Comment: denies use of marijuana      Allergies   Patient has no known allergies.   Review of Systems Review of Systems  All other systems reviewed and are negative.    Physical Exam Updated Vital Signs BP (!) 123/92 (BP Location: Left Arm)   Pulse 91   Temp 98.3 F (36.8 C) (Oral)   Resp 18   SpO2 100%   Physical Exam Vitals signs and nursing note reviewed.  Constitutional:      Appearance: He is well-developed.  HENT:     Head: Normocephalic and atraumatic.  Neck:     Musculoskeletal: Normal range of motion.  Cardiovascular:     Rate and Rhythm: Normal rate.  Pulmonary:      Effort: Pulmonary effort is normal. No respiratory distress.  Abdominal:     General: There is no distension.  Musculoskeletal: Normal range of motion.  Neurological:     Mental Status: He is alert.  Psychiatric:        Attention and Perception: Attention and perception normal.        Mood and Affect: Mood normal. Affect is not labile, flat, angry or inappropriate.        Speech: Speech normal.        Behavior: Behavior normal. Behavior is not agitated, aggressive or combative.        Thought Content: Thought content normal. Thought content is not paranoid or delusional. Thought content does not include homicidal or suicidal ideation. Thought content does not include homicidal or suicidal plan.      ED Treatments / Results  Labs (all labs ordered are listed, but only abnormal results are displayed) Labs Reviewed  COMPREHENSIVE METABOLIC PANEL - Abnormal; Notable for the following components:      Result Value   Alkaline Phosphatase 165 (*)    All other components within normal limits  CBC - Abnormal; Notable for the following components:   Hemoglobin 12.1 (*)    HCT 37.8 (*)    All other components within normal limits  ACETAMINOPHEN LEVEL - Abnormal; Notable for the following components:   Acetaminophen (Tylenol), Serum <10 (*)    All other components within normal limits  ETHANOL  RAPID URINE DRUG SCREEN, HOSP PERFORMED  SALICYLATE LEVEL    EKG None  Radiology No results found.  Procedures Procedures (including critical care time)  Medications Ordered in ED Medications - No data to display   Initial Impression / Assessment and Plan / ED Course  I have reviewed the triage vital signs and the nursing notes.  Pertinent labs & imaging results that were available during my care of the patient were reviewed by me and considered in my medical decision making (see chart for details).     Angry outburst. Calm and collected for multiple hours while in ED. No SI/HI  or obvious hallucinations/psychosis.  Wants help with anger management, no indication that he is a danger to self or others at this time so no indication for TTS consultation or further workup in ED at this time. Outpatient resources provided.   Final Clinical Impressions(s) / ED Diagnoses   Final diagnoses:  Aggressive behavior    ED Discharge Orders    None       Jeray Shugart, Barbara Cower, MD 09/03/18 2145

## 2018-09-03 NOTE — ED Triage Notes (Signed)
Pt in POV stating his dad made him come. Pt reports he was upstairs in his room playing a video game, got mad at the game and stared throwing stuff in his room. His dad sent him here "to be calmed down" Pt denies SI/HI.

## 2018-12-13 ENCOUNTER — Emergency Department (HOSPITAL_COMMUNITY)
Admission: EM | Admit: 2018-12-13 | Discharge: 2018-12-14 | Disposition: A | Discharge status: Home / Self Care | Payer: Self-pay | Attending: Emergency Medicine | Admitting: Emergency Medicine

## 2018-12-13 ENCOUNTER — Encounter (HOSPITAL_COMMUNITY): Payer: Self-pay

## 2018-12-13 DIAGNOSIS — F17210 Nicotine dependence, cigarettes, uncomplicated: Secondary | ICD-10-CM | POA: Insufficient documentation

## 2018-12-13 DIAGNOSIS — R440 Auditory hallucinations: Secondary | ICD-10-CM

## 2018-12-13 DIAGNOSIS — F209 Schizophrenia, unspecified: Secondary | ICD-10-CM | POA: Insufficient documentation

## 2018-12-13 DIAGNOSIS — D649 Anemia, unspecified: Secondary | ICD-10-CM | POA: Insufficient documentation

## 2018-12-13 DIAGNOSIS — Z7982 Long term (current) use of aspirin: Secondary | ICD-10-CM | POA: Insufficient documentation

## 2018-12-13 DIAGNOSIS — Z79899 Other long term (current) drug therapy: Secondary | ICD-10-CM | POA: Insufficient documentation

## 2018-12-13 LAB — CBC
HCT: 39.1 % (ref 39.0–52.0)
Hemoglobin: 12.8 g/dL — ABNORMAL LOW (ref 13.0–17.0)
MCH: 28.1 pg (ref 26.0–34.0)
MCHC: 32.7 g/dL (ref 30.0–36.0)
MCV: 85.7 fL (ref 80.0–100.0)
Platelets: 305 10*3/uL (ref 150–400)
RBC: 4.56 MIL/uL (ref 4.22–5.81)
RDW: 12.3 % (ref 11.5–15.5)
WBC: 7.6 10*3/uL (ref 4.0–10.5)
nRBC: 0 % (ref 0.0–0.2)

## 2018-12-13 LAB — COMPREHENSIVE METABOLIC PANEL
ALT: 38 U/L (ref 0–44)
AST: 31 U/L (ref 15–41)
Albumin: 4.1 g/dL (ref 3.5–5.0)
Alkaline Phosphatase: 178 U/L — ABNORMAL HIGH (ref 38–126)
Anion gap: 9 (ref 5–15)
BUN: <5 mg/dL — ABNORMAL LOW (ref 6–20)
CO2: 24 mmol/L (ref 22–32)
Calcium: 9.4 mg/dL (ref 8.9–10.3)
Chloride: 103 mmol/L (ref 98–111)
Creatinine, Ser: 0.48 mg/dL — ABNORMAL LOW (ref 0.61–1.24)
GFR calc Af Amer: >60 mL/min (ref >60)
GFR calc non Af Amer: >60 mL/min (ref >60)
Glucose, Bld: 86 mg/dL (ref 70–99)
Potassium: 4.2 mmol/L (ref 3.5–5.1)
Sodium: 136 mmol/L (ref 135–145)
Total Bilirubin: 0.9 mg/dL (ref 0.3–1.2)
Total Protein: 7.2 g/dL (ref 6.5–8.1)

## 2018-12-13 LAB — RAPID URINE DRUG SCREEN, HOSP PERFORMED
Amphetamines: NOT DETECTED
Barbiturates: NOT DETECTED
Benzodiazepines: NOT DETECTED
Cocaine: NOT DETECTED
Opiates: NOT DETECTED
Tetrahydrocannabinol: NOT DETECTED

## 2018-12-13 LAB — SALICYLATE LEVEL: Salicylate Lvl: <7 mg/dL (ref 2.8–30.0)

## 2018-12-13 LAB — ACETAMINOPHEN LEVEL: Acetaminophen (Tylenol), Serum: <10 ug/mL — ABNORMAL LOW (ref 10–30)

## 2018-12-13 LAB — ETHANOL: Alcohol, Ethyl (B): <10 mg/dL (ref <10)

## 2018-12-13 NOTE — ED Notes (Signed)
Patient given PO fluids and [place] sandwich.

## 2018-12-13 NOTE — ED Notes (Signed)
TTS in process 

## 2018-12-13 NOTE — ED Notes (Signed)
TTS Clinician attempted tele assessment, no answer.

## 2018-12-13 NOTE — ED Notes (Signed)
Wanded by security and changed into purple scrubs.

## 2018-12-13 NOTE — ED Provider Notes (Signed)
[name] EMERGENCY DEPARTMENT Provider Note   CSN: [id number] Arrival date & time: 12/13/18  2046    History   Chief Complaint Chief Complaint  Patient presents with  . Suicidal  . Hallucinations    HPI [name] [name] is a 28 y.o. male.  28 year old male here in the ED complaining of hearing voices and also with thoughts of hurting himself.  He denies a specific plan.  It sounds like he just got kicked out of his house and is on the street homeless.  He denies being on any regular medications.  He does not have a doctor or psychiatrist.  He said he is heard voices for a long time.  He admits to smoking cigarettes but he denies any alcohol or drugs.  He denies any medical complaints.     The history is provided by the patient.  Mental Health Problem  Presenting symptoms: hallucinations and suicidal thoughts   Degree of incapacity (severity):  Unable to specify Onset quality:  Gradual Timing:  Constant Progression:  Worsening Chronicity:  Recurrent Context: stressful life event   Treatment compliance:  Untreated Relieved by:  None tried Worsened by:  Nothing Ineffective treatments:  None tried Associated symptoms: feelings of worthlessness   Associated symptoms: no abdominal pain, no chest pain and no headaches   Risk factors: hx of mental illness     Past Medical History:  Diagnosis Date  . Schizophrenia (HCC)   . Tibial plateau fracture, left     Patient Active Problem List   Diagnosis Date Noted  . History of open reduction and internal fixation (ORIF) procedure 04/15/2018  . Auditory hallucination   . S/P appendectomy 07/16/2017  . 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 LAPAROSCOPIC APPENDECTOMY N/A 07/16/2017   Procedure: APPENDECTOMY LAPAROSCOPIC;  Surgeon: Axel Filler, MD;  Location: [name] OR;  Service: General;   Laterality: N/A;  . LAPAROTOMY N/A 07/16/2017   Procedure: POSSIBLE OPEN  LAPAROTOMY;  Surgeon: Axel Filler, MD;  Location: [facility] OR;  Service: General;  Laterality: N/A;  . NO PAST SURGERIES    . ORIF TIBIA PLATEAU Left 04/15/2018   Procedure: OPEN REDUCTION INTERNAL FIXATION (ORIF) LEFT BICONDYLAR TIBIAL PLATEAU;  Surgeon: Tarry Kos, MD;  Location: [facility] OR;  Service: Orthopedics;  Laterality: Left;        Home Medications    Prior to Admission medications   Medication Sig Start Date End Date Taking? Authorizing Provider  aspirin EC 81 MG tablet Take 1 tablet (81 mg total) by mouth 2 (two) times daily. 04/15/18   Tarry Kos, MD  calcium-vitamin D (OSCAL WITH D) 500-200 MG-UNIT tablet Take 1 tablet by mouth 3 (three) times daily. 04/15/18   Tarry Kos, MD  methocarbamol (ROBAXIN) 750 MG tablet Take 1 tablet (750 mg total) by mouth 2 (two) times daily as needed for muscle spasms. 04/15/18   Tarry Kos, MD  ondansetron (ZOFRAN) 4 MG tablet Take 1-2 tablets (4-8 mg total) by mouth every 8 (eight) hours as needed for nausea or vomiting. 04/15/18   Tarry Kos, MD  oxyCODONE (OXY IR/ROXICODONE) 5 MG immediate release tablet Take 1-3 tablets (5-15 mg total) by mouth every 4 (four) hours as needed. 04/15/18   Tarry Kos, MD  promethazine (PHENERGAN) 25 MG tablet Take 1 tablet (25 mg total) by mouth every 6 (six) hours as needed for  nausea. 04/15/18   Tarry Kos, MD  senna-docusate (SENOKOT S) 8.6-50 MG tablet Take 1 tablet by mouth at bedtime as needed. 04/15/18   Tarry Kos, MD  zinc sulfate 220 (50 Zn) MG capsule Take 1 capsule (220 mg total) by mouth daily. 04/15/18   Tarry Kos, MD    Family History Family History  Problem Relation Age of Onset  . Other Mother   . Other Brother     Social History Social History   Tobacco Use  . Smoking status: Current Some Day Smoker    Types: Cigarettes  . Smokeless tobacco: Never Used  Substance Use Topics  . Alcohol use: No     Frequency: Never  . Drug use: Yes    Types: Marijuana    Comment: denies use of marijuana      Allergies   Patient has no known allergies.   Review of Systems Review of Systems  Constitutional: Negative for fever.  HENT: Negative for sore throat.   Eyes: Negative for visual disturbance.  Respiratory: Negative for shortness of breath.   Cardiovascular: Negative for chest pain.  Gastrointestinal: Negative for abdominal pain.  Genitourinary: Negative for dysuria.  Skin: Negative for rash.  Neurological: Negative for headaches.  Psychiatric/Behavioral: Positive for hallucinations and suicidal ideas.     Physical Exam Updated Vital Signs BP 132/80 (BP Location: Right Arm)   Pulse 100   Temp 98.2 F (36.8 C) (Oral)   Resp 18   Ht 5\' 4"  (1.626 m)   SpO2 100%   BMI 24.03 kg/m   Physical Exam Vitals signs and nursing note reviewed.  Constitutional:      Appearance: He is well-developed.  HENT:     Head: Normocephalic and atraumatic.  Eyes:     Conjunctiva/sclera: Conjunctivae normal.  Neck:     Musculoskeletal: Neck supple.  Cardiovascular:     Rate and Rhythm: Normal rate and regular rhythm.     Heart sounds: No murmur.  Pulmonary:     Effort: Pulmonary effort is normal. No respiratory distress.     Breath sounds: Normal breath sounds.  Abdominal:     Palpations: Abdomen is soft.     Tenderness: There is no abdominal tenderness.  Musculoskeletal: Normal range of motion.     Right lower leg: No edema.     Left lower leg: No edema.  Skin:    General: Skin is warm and dry.  Neurological:     General: No focal deficit present.     Mental Status: He is alert.     Gait: Gait normal.  Psychiatric:        Attention and Perception: Attention normal.        Mood and Affect: Affect is flat.        Speech: Speech normal.        Behavior: Behavior is cooperative.      ED Treatments / Results  Labs (all labs ordered are listed, but only abnormal results are  displayed) Labs Reviewed  COMPREHENSIVE METABOLIC PANEL - Abnormal; Notable for the following components:      Result Value   BUN <5 (*)    Creatinine, Ser 0.48 (*)    Alkaline Phosphatase 178 (*)    All other components within normal limits  ACETAMINOPHEN LEVEL - Abnormal; Notable for the following components:   Acetaminophen (Tylenol), Serum <10 (*)    All other components within normal limits  CBC - Abnormal; Notable for the following components:  Hemoglobin 12.8 (*)    All other components within normal limits  ETHANOL  SALICYLATE LEVEL  RAPID URINE DRUG SCREEN, HOSP PERFORMED    EKG None  Radiology No results found.  Procedures Procedures (including critical care time)  Medications Ordered in ED Medications - No data to display   Initial Impression / Assessment and Plan / ED Course  I have reviewed the triage vital signs and the nursing notes.  Pertinent labs & imaging results that were available during my care of the patient were reviewed by me and considered in my medical decision making (see chart for details).  Clinical Course as of Dec 14 1234  Tue Dec 13, 2018  2203 Patient with very passive SI in the setting of new homelessness.  He is also complaining of auditory hallucinations which she has had for a while.  He is voluntary currently and I put him in for a behavioral health consult.  If he chooses to leave I do not think he meets IVC criteria.   [MB]    Clinical Course User Index [MB] Terrilee Files, MD       Final Clinical Impressions(s) / ED Diagnoses   Final diagnoses:  Auditory hallucinations  Normochromic normocytic anemia    ED Discharge Orders    None       Terrilee Files, MD 12/14/18 1237

## 2018-12-13 NOTE — BH Assessment (Addendum)
Tele Assessment Note   Patient Name: [name] [name] MRN: [id number] Referring Physician: Meridee [name] Location of Patient: [facility] Location of Provider: Behavioral Health TTS Department  [name] [name] is an 28 y.o. male presenting with SI with no plan and hallucinations. Patient reported riding the bus to the ED. Patient reported visual and auditory hallucinations. Patient reported seeing both people he knew and strangers. Patient reported hearing voices telling me to do things and how to do things, but not to hurt myself or anyone else. Patient reported having suicidal thoughts for 1 year and hearing voices for the past 2 years. Patient reported no past suicidal attempts and prior self harming behaviors. Patient reported in 2018 he stayed with uncle in [place], whom sent him to [facility] in [place] where he stayed for 3 weeks for hearing voices. In 2013, per patient record, he was inpatient at [facility] for mental illness. Patient denied SI with plan, HI, drug and alcohol usage.  Patient reported being homeless since yesterday. Patient reported living with his brother until yesterday when landlord accused them of playing television too loud, landlord stated he was not on the rental contract and needed leave because he was continually being seen at his brothers apartment, so patient left the apartment and is now homeless. Patient was cooperative during assessment and continued to rock back and forth during assessment.  UDS negative ETOH negative  Collateral Contact [name] [name], patients twin brother, [phone], by phone. [name] [name] reported no concerns regarding patient hurting himself or others. [name] [name] stated patient did not have access to guns or weapons. [name] [name] stated patient would not act on SI and doesn't have a plan.    Diagnosis: Schizophrenia  Past Medical History:  Past Medical History:  Diagnosis Date  . Schizophrenia (HCC)   . Tibial plateau fracture, left      Past Surgical History:  Procedure Laterality Date  . LAPAROSCOPIC APPENDECTOMY N/A 07/16/2017   Procedure: APPENDECTOMY LAPAROSCOPIC;  Surgeon: [name] Filler, MD;  Location: [facility] OR;  Service: General;  Laterality: N/A;  . LAPAROTOMY N/A 07/16/2017   Procedure: POSSIBLE OPEN  LAPAROTOMY;  Surgeon: [name] Filler, MD;  Location: [facility] OR;  Service: General;  Laterality: N/A;  . NO PAST SURGERIES    . ORIF TIBIA PLATEAU Left 04/15/2018   Procedure: OPEN REDUCTION INTERNAL FIXATION (ORIF) LEFT BICONDYLAR TIBIAL PLATEAU;  Surgeon: [name] Kos, MD;  Location: [facility] OR;  Service: Orthopedics;  Laterality: Left;    Family History:  Family History  Problem Relation Age of Onset  . Other Mother   . Other Brother     Social History:  reports that he has been smoking cigarettes. He has never used smokeless tobacco. He reports current drug use. Drug: Marijuana. He reports that he does not drink alcohol.  Additional Social History:  Alcohol / Drug Use Pain Medications: see MAR Prescriptions: see MAR Over the Counter: see MAR  CIWA: CIWA-Ar BP: 132/80 Pulse Rate: 100 COWS:    Allergies: No Known Allergies  Home Medications: (Not in a hospital admission)   OB/GYN Status:  No LMP for male patient.  General Assessment Data Assessment unable to be completed: Yes Reason for not completing assessment: (tele machine, no answer) Location of Assessment: [facility] ED TTS Assessment: In system Is this a Tele or Face-to-Face Assessment?: Tele Assessment Is this an Initial Assessment or a Re-assessment for this encounter?: Initial Assessment Patient Accompanied by:: N/A Language Other than English: No Living Arrangements: Homeless/Shelter What gender do you  identify as?: Male Marital status: Single Pregnancy Status: (n/a) Living Arrangements: (living with brother) Can pt return to current living arrangement?: No Admission Status: Voluntary Is patient capable of signing voluntary admission?:  Yes Referral Source: Self/Family/Friend     Crisis Care Plan Living Arrangements: (living with brother) Legal Guardian: (self) Name of Psychiatrist: (none) Name of Therapist: (none)  Education Status Is patient currently in school?: No Is the patient employed, unemployed or receiving disability?: Unemployed  Risk to self with the past 6 months Suicidal Ideation: Yes-Currently Present Has patient been a risk to self within the past 6 months prior to admission? : Yes Suicidal Intent: Yes-Currently Present Has patient had any suicidal intent within the past 6 months prior to admission? : Yes Is patient at risk for suicide?: Yes Suicidal Plan?: No Has patient had any suicidal plan within the past 6 months prior to admission? : No Access to Means: No What has been your use of drugs/alcohol within the last 12 months?: (denied) Previous Attempts/Gestures: No How many times?: (0) Other Self Harm Risks: (none) Triggers for Past Attempts: (n/a) Intentional Self Injurious Behavior: None Family Suicide History: No Recent stressful life event(s): (hearing voices) Persecutory voices/beliefs?: No Depression: Yes Depression Symptoms: Guilt Substance abuse history and/or treatment for substance abuse?: No Suicide prevention information given to non-admitted patients: Not applicable  Risk to Others within the past 6 months Homicidal Ideation: No Does patient have any lifetime risk of violence toward others beyond the six months prior to admission? : No Thoughts of Harm to Others: No Current Homicidal Intent: No Current Homicidal Plan: No Access to Homicidal Means: No Identified Victim: (n/a) History of harm to others?: No Assessment of Violence: None Noted Violent Behavior Description: (none reported) Does patient have access to weapons?: No Criminal Charges Pending?: No Does patient have a court date: No Is patient on probation?: No  Psychosis Hallucinations: Auditory,  Visual Delusions: None noted  Mental Status Report Appearance/Hygiene: Unremarkable Eye Contact: Fair Motor Activity: Restlessness(rocking back and forth) Speech: Unremarkable Level of Consciousness: Alert Mood: Anxious, Depressed, Helpless Affect: Anxious, Depressed Anxiety Level: Severe Thought Processes: Coherent, Relevant Judgement: Partial Orientation: Person, Place, Time, Situation Obsessive Compulsive Thoughts/Behaviors: None  Cognitive Functioning Concentration: Fair Memory: Recent Intact Is patient IDD: No Insight: Fair Impulse Control: Poor Appetite: Good Have you had any weight changes? : No Change Sleep: No Change Total Hours of Sleep: ("normal") Vegetative Symptoms: None  ADLScreening [facility] Assessment Services) Patient's cognitive ability adequate to safely complete daily activities?: Yes Patient able to express need for assistance with ADLs?: Yes Independently performs ADLs?: Yes (appropriate for developmental age)  Prior Inpatient Therapy Prior Inpatient Therapy: Yes Prior Therapy Dates: (2018) Prior Therapy Facilty/Provider(s): [facility] [place] Reason for Treatment: (hallucinations, hearing voices)  Prior Outpatient Therapy Prior Outpatient Therapy: No Does patient have an ACCT team?: No Does patient have Intensive In-House Services?  : No Does patient have [facility] services? : No Does patient have P4CC services?: No  ADL Screening (condition at time of admission) Patient's cognitive ability adequate to safely complete daily activities?: Yes Patient able to express need for assistance with ADLs?: Yes Independently performs ADLs?: Yes (appropriate for developmental age)  Merchant navy officerAdvance Directives (For Healthcare) Does Patient Have a Medical Advance Directive?: No Would patient like information on creating a medical advance directive?: No - Patient declined   Disposition:  Disposition Initial Assessment Completed for this Encounter: Yes   Donell SievertSpencer Simon, PA, patient does not meet inpatient criteria. TTS Clinician faxed  outpatient mental health resources to patient. RN informed of disposition.  This service was provided via telemedicine using a 2-way, interactive audio and video technology.  Names of all persons participating in this telemedicine service and their role in this encounter. Name: [name] Role: Patient  Name: Al Corpus, [facility] Role: TTS Clinician  Name:  Role:   Name:  Role:     Burnetta Sabin, [facility] 12/13/2018 11:58 PM

## 2018-12-13 NOTE — ED Triage Notes (Addendum)
Patient reports thoughts of SI without a current plan. Patient also reports auditory and visual hallucinations. Patient reports similar episodes in the past but was never diagnosed with anything. Denies chest pain, fever, cough, SOB, or any other symptoms at this time.   Patient was living with his brother but the landlord said he couldn't live there anymore. Currently homeless.

## 2018-12-14 NOTE — Discharge Instructions (Signed)
Please follow up with the resources given to you by TTS.

## 2018-12-14 NOTE — ED Notes (Signed)
ED Provider at bedside. 

## 2018-12-14 NOTE — ED Provider Notes (Signed)
TTS consultation appreciated.  Patient does not meet criteria for inpatient psychiatric care.  He is given outpatient resources and is discharged.   Dione Booze, MD 12/14/18 0157

## 2019-01-10 ENCOUNTER — Observation Stay (HOSPITAL_COMMUNITY)
Admission: RE | Admit: 2019-01-10 | Discharge: 2019-01-11 | Disposition: A | Discharge status: Home / Self Care | Payer: Self-pay | Attending: Endocrinology | Admitting: Endocrinology

## 2019-01-10 DIAGNOSIS — Z818 Family history of other mental and behavioral disorders: Secondary | ICD-10-CM | POA: Insufficient documentation

## 2019-01-10 DIAGNOSIS — F419 Anxiety disorder, unspecified: Secondary | ICD-10-CM | POA: Insufficient documentation

## 2019-01-10 DIAGNOSIS — F17210 Nicotine dependence, cigarettes, uncomplicated: Secondary | ICD-10-CM | POA: Insufficient documentation

## 2019-01-10 DIAGNOSIS — Z590 Homelessness: Secondary | ICD-10-CM | POA: Insufficient documentation

## 2019-01-10 DIAGNOSIS — R440 Auditory hallucinations: Secondary | ICD-10-CM

## 2019-01-10 DIAGNOSIS — F333 Major depressive disorder, recurrent, severe with psychotic symptoms: Principal | ICD-10-CM | POA: Insufficient documentation

## 2019-01-10 MED ORDER — LORAZEPAM 1 MG PO TABS: 1.0000 mg | ORAL_TABLET | ORAL | Status: DC | PRN

## 2019-01-10 MED ORDER — ACETAMINOPHEN 325 MG PO TABS: 650.0000 mg | ORAL_TABLET | Freq: Four times a day (QID) | ORAL | Status: DC | PRN

## 2019-01-10 MED ORDER — HYDROXYZINE HCL 25 MG PO TABS: 25.0000 mg | ORAL_TABLET | Freq: Four times a day (QID) | ORAL | Status: DC | PRN

## 2019-01-10 MED ORDER — MAGNESIUM HYDROXIDE 400 MG/5ML PO SUSP: 30.0000 mL | Freq: Every day | ORAL | Status: DC | PRN

## 2019-01-10 MED ORDER — ZIPRASIDONE MESYLATE 20 MG IM SOLR: 20.0000 mg | INTRAMUSCULAR | Status: DC | PRN

## 2019-01-10 MED ORDER — ALUM & MAG HYDROXIDE-SIMETH 200-200-20 MG/5ML PO SUSP: 30.0000 mL | ORAL | Status: DC | PRN

## 2019-01-10 MED ORDER — OLANZAPINE 5 MG PO TBDP
5.0000 mg | ORAL_TABLET | Freq: Three times a day (TID) | ORAL | Status: DC | PRN
  Administered 2019-01-11: 5 mg via ORAL
  Filled 2019-01-10: qty 1

## 2019-01-10 MED ORDER — TRAZODONE HCL 50 MG PO TABS: 50.0000 mg | ORAL_TABLET | Freq: Every evening | ORAL | Status: DC | PRN

## 2019-01-10 NOTE — H&P (Signed)
BH Observation Unit Provider Admission PAA/H&P  Patient Identification: [name] [name] MRN:  [id number] Date of Evaluation:  01/10/2019 Chief Complaint:  MDD Principal Diagnosis: <principal problem not specified> Diagnosis:  Active Problems:   * No active hospital problems. *  History of Present Illness: [name] [name] is a 28 y/o male presenting with increased anxiety for two years, denying panic attacks but notable paranoia, insomnia, agitation and AVH. Seeing shadowy figures and hearing intelligible voices whisper in his ear and give him commands to do things. He endorses despair, hopelessness, and feelings of isolation. He is denying any SI/SA or HI. He is currently homeless, unemployed and uses cannabis and tobacco, but denies alcohol, cocaine, or heroin use. Associated Signs/Symptoms: Depression Symptoms:  depressed mood, anxiety, (Hypo) Manic Symptoms:  Delusions, Hallucinations, Anxiety Symptoms:  Excessive Worry, Psychotic Symptoms:  Hallucinations: Auditory Visual PTSD Symptoms: NA Total Time spent with patient: 30 minutes  Past Psychiatric History: benign Is the patient at risk to self? No.  Has the patient been a risk to self in the past 6 months? No.  Has the patient been a risk to self within the distant past? No.  Is the patient a risk to others? No.  Has the patient been a risk to others in the past 6 months? No.  Has the patient been a risk to others within the distant past? No.   Prior Inpatient Therapy: Prior Inpatient Therapy: No Prior Outpatient Therapy: Prior Outpatient Therapy: No Does patient have an ACCT team?: No Does patient have Intensive In-House Services?  : No Does patient have [facility] services? : No Does patient have P4CC services?: No  Alcohol Screening:   Substance Abuse History in the last 12 months:  Yes.   Consequences of Substance Abuse: Family Consequences:  homeless Previous Psychotropic Medications: Yes  Psychological Evaluations: Yes   Past Medical History:  Past Medical History:  Diagnosis Date  . Schizophrenia (HCC)   . Tibial plateau fracture, left     Past Surgical History:  Procedure Laterality Date  . LAPAROSCOPIC APPENDECTOMY N/A 07/16/2017   Procedure: APPENDECTOMY LAPAROSCOPIC;  Surgeon: Axel Filleramirez, Armando, MD;  [facility];  Service: General;  Laterality: N/A;  . LAPAROTOMY N/A 07/16/2017   Procedure: POSSIBLE OPEN  LAPAROTOMY;  Surgeon: Axel Filleramirez, Armando, MD;  [facility];  Service: General;  Laterality: N/A;  . NO PAST SURGERIES    . ORIF TIBIA PLATEAU Left 04/15/2018   Procedure: OPEN REDUCTION INTERNAL FIXATION (ORIF) LEFT BICONDYLAR TIBIAL PLATEAU;  Surgeon: Tarry KosXu, Naiping M, MD;  Location: [facility] OR;  Service: Orthopedics;  Laterality: Left;   Family History:  Family History  Problem Relation Age of Onset  . Other Mother   . Other Brother    Family Psychiatric History: Schizophrenia Tobacco Screening:   Social History:  Social History   Substance and Sexual Activity  Alcohol Use No  . Frequency: Never     Social History   Substance and Sexual Activity  Drug Use Yes  . Types: Marijuana   Comment: denies use of marijuana     Additional Social History: Marital status: Single    Pain Medications: see MAR Prescriptions: see MAR Over the Counter: see MAR                    Allergies:  No Known Allergies Lab Results: No results found for this or any previous visit (from the past 48 hour(s)).  Blood Alcohol level:  Lab Results  Component Value Date   ETH <  10 12/13/2018   ETH <10 09/03/2018    Metabolic Disorder Labs:  No results found for: HGBA1C, MPG Lab Results  Component Value Date   PROLACTIN 5.6 01/14/2015   No results found for: CHOL, TRIG, HDL, CHOLHDL, VLDL, LDLCALC  Current Medications: No current facility-administered medications for this encounter.    PTA Medications: No medications prior to admission.    Musculoskeletal: Strength & Muscle Tone:  within normal limits Gait & Station: normal Patient leans: N/A  Psychiatric Specialty Exam: Physical Exam  Constitutional: He is oriented to person, place, and time. He appears well-developed and well-nourished. No distress.  HENT:  Head: Normocephalic.  Eyes: Pupils are equal, round, and reactive to light.  Respiratory: Effort normal and breath sounds normal. No respiratory distress.  Neurological: He is alert and oriented to person, place, and time. No cranial nerve deficit.  Skin: Skin is warm and dry. He is not diaphoretic.  Psychiatric: His speech is normal and behavior is normal. His mood appears anxious. Cognition and memory are impaired. He expresses inappropriate judgment. He exhibits a depressed mood. He expresses no homicidal and no suicidal ideation. He expresses no suicidal plans and no homicidal plans.    Review of Systems  Constitutional: Negative for chills, diaphoresis, fever, malaise/fatigue and weight loss.  Psychiatric/Behavioral: Positive for depression, hallucinations and substance abuse. Negative for memory loss and suicidal ideas. The patient is nervous/anxious. The patient does not have insomnia.   All other systems reviewed and are negative.   Blood pressure (!) 140/101, pulse (!) 114, temperature 98.6 F (37 C), temperature source Temporal, resp. rate 18, SpO2 100 %.There is no height or weight on file to calculate BMI.  General Appearance: Casual  Eye Contact:  Fair  Speech:  Clear and Coherent  Volume:  Normal  Mood:  Anxious  Affect:  Congruent  Thought Process:  Disorganized  Orientation:  Full (Time, Place, and Person)  Thought Content:  Hallucinations: Auditory Visual  Suicidal Thoughts:  No  Homicidal Thoughts:  No  Memory:  Immediate;   Fair  Judgement:  Poor  Insight:  Lacking  Psychomotor Activity:  Normal  Concentration:  Concentration: Fair  Recall:  [employer] of Knowledge:  Fair  Language:  Fair  Akathisia:  Negative  Handed:  Right   AIMS (if indicated):     Assets:  Desire for Improvement  ADL's:  Intact  Cognition:  WNL  Sleep:         Treatment Plan Summary: Daily contact with patient to assess and evaluate symptoms and progress in treatment  Observation Level/Precautions:  Continuous Observation Laboratory:  Chemistry Profile Psychotherapy:   Medications:   Consultations:   Discharge Concerns:   Estimated LOS: Other:      Kerry Hough, PA-C 4/21/202011:08 PM

## 2019-01-10 NOTE — BH Assessment (Signed)
Assessment Note  [name] [name] is an 28 y.o. male presenting with hallucinations and increased anxiety. Patient reported hearing voices during assessment. Patient stated "voices just stated I am not attracted to me". Patient stated to clinician "there is a face beside you looking at you I see it". Patient motioned to face beside clinician. Patient reported seeing both people he knew and strangers. Patient reported hearing voices telling me to do things and how to do things, but not to hurt myself or anyone else. Patient reported hearing voices for the past 2 years. Patient reported increased anxiety stating "I can't control thoughts, I have no control over anything, my heart is pounding, I have racing and intrusive thoughts". Patient reported no past suicidal attempts and prior self harming behaviors. Patient reported in 2018 he stayed with uncle in [place], whom sent him to [facility] in [place] where he stayed for 3 weeks for hearing voices. In 2013, per patient record, he was inpatient at [facility] for mental illness. Patient denied SI with plan, HI, drug and alcohol usage.  Patient reported being homeless. Patient reported living with his brother until last month when landlord accused them of playing television too loud, landlord stated he was not on the rental contract and needed leave because he was continually being seen at his brothers apartment, so patient left the apartment and has been homeless. Patient was cooperative during assessment and continued to rock back and forth during assessment.  Collateral Contact Mosie, patients twin brother, [phone], patient brother was not available. TTS will attempt at later time.   Diagnosis: Major depressive disorder and Schizophrenia  Past Medical History:  Past Medical History:  Diagnosis Date  . Schizophrenia (HCC)   . Tibial plateau fracture, left     Past Surgical History:  Procedure Laterality Date  . LAPAROSCOPIC  APPENDECTOMY N/A 07/16/2017   Procedure: APPENDECTOMY LAPAROSCOPIC;  Surgeon: Axel Filler, MD;  Location: [facility] OR;  Service: General;  Laterality: N/A;  . LAPAROTOMY N/A 07/16/2017   Procedure: POSSIBLE OPEN  LAPAROTOMY;  Surgeon: Axel Filler, MD;  Location: [facility] OR;  Service: General;  Laterality: N/A;  . NO PAST SURGERIES    . ORIF TIBIA PLATEAU Left 04/15/2018   Procedure: OPEN REDUCTION INTERNAL FIXATION (ORIF) LEFT BICONDYLAR TIBIAL PLATEAU;  Surgeon: Tarry Kos, MD;  Location: [facility] OR;  Service: Orthopedics;  Laterality: Left;    Family History:  Family History  Problem Relation Age of Onset  . Other Mother   . Other Brother     Social History:  reports that he has been smoking cigarettes. He has never used smokeless tobacco. He reports current drug use. Drug: Marijuana. He reports that he does not drink alcohol.  Additional Social History:  Alcohol / Drug Use Pain Medications: see MAR Prescriptions: see MAR Over the Counter: see MAR  CIWA: CIWA-Ar BP: (!) 140/101 Pulse Rate: (!) 114 COWS:    Allergies: No Known Allergies  Home Medications: (Not in a hospital admission)   OB/GYN Status:  No LMP for male patient.  General Assessment Data Location of Assessment: [facility] Assessment Services TTS Assessment: In system Is this a Tele or Face-to-Face Assessment?: Face-to-Face Is this an Initial Assessment or a Re-assessment for this encounter?: Initial Assessment Patient Accompanied by:: N/A Language Other than English: No Living Arrangements: Homeless/Shelter What gender do you identify as?: Male Marital status: Single Living Arrangements: (homeless) Can pt return to current living arrangement?: Yes Admission Status: Voluntary Is patient capable of signing  voluntary admission?: Yes Referral Source: Self/Family/Friend  Crisis Care Plan Living Arrangements: (homeless) Legal Guardian: (self) Name of Psychiatrist: (none) Name of Therapist: (none)  Education  Status Is patient currently in school?: No Is the patient employed, unemployed or receiving disability?: Unemployed  Risk to self with the past 6 months Suicidal Ideation: No Has patient been a risk to self within the past 6 months prior to admission? : No Suicidal Intent: No Has patient had any suicidal intent within the past 6 months prior to admission? : No Is patient at risk for suicide?: No Suicidal Plan?: No Has patient had any suicidal plan within the past 6 months prior to admission? : No Access to Means: No What has been your use of drugs/alcohol within the last 12 months?: (marijuana 2x weekly) Previous Attempts/Gestures: No How many times?: (0) Other Self Harm Risks: (n/a) Triggers for Past Attempts: (denied) Intentional Self Injurious Behavior: Cutting("long time ago") Family Suicide History: No Recent stressful life event(s): (increased anxiety) Persecutory voices/beliefs?: No Depression: Yes Depression Symptoms: Insomnia, Tearfulness, Isolating, Fatigue, Guilt, Loss of interest in usual pleasures, Feeling worthless/self pity, Feeling angry/irritable Substance abuse history and/or treatment for substance abuse?: No Suicide prevention information given to non-admitted patients: Not applicable  Risk to Others within the past 6 months Homicidal Ideation: No Does patient have any lifetime risk of violence toward others beyond the six months prior to admission? : No Thoughts of Harm to Others: No Current Homicidal Intent: No Current Homicidal Plan: No Access to Homicidal Means: No Identified Victim: (n/a) History of harm to others?: No Assessment of Violence: None Noted Violent Behavior Description: (n/a) Does patient have access to weapons?: No Criminal Charges Pending?: No Does patient have a court date: No Is patient on probation?: No  Psychosis Hallucinations: Auditory, Visual Delusions: None noted  Mental Status Report Appearance/Hygiene: Unremarkable Eye  Contact: Fair Motor Activity: Restlessness Speech: Pressured Level of Consciousness: Alert Mood: Depressed, Anxious Affect: Depressed, Anxious Anxiety Level: Severe Thought Processes: Relevant, Coherent Judgement: Impaired Orientation: Person, Place, Time, Situation Obsessive Compulsive Thoughts/Behaviors: None  Cognitive Functioning Concentration: Fair Memory: Recent Intact Is patient IDD: No Insight: Poor Impulse Control: Poor Appetite: Poor Have you had any weight changes? : No Change Sleep: Decreased Total Hours of Sleep: ("no sleep in 2 days") Vegetative Symptoms: None  ADLScreening [facility] Assessment Services) Patient's cognitive ability adequate to safely complete daily activities?: Yes Patient able to express need for assistance with ADLs?: Yes Independently performs ADLs?: Yes (appropriate for developmental age)  Prior Inpatient Therapy Prior Inpatient Therapy: No  Prior Outpatient Therapy Prior Outpatient Therapy: No Does patient have an ACCT team?: No Does patient have Intensive In-House Services?  : No Does patient have [facility] services? : No Does patient have P4CC services?: No  ADL Screening (condition at time of admission) Patient's cognitive ability adequate to safely complete daily activities?: Yes Patient able to express need for assistance with ADLs?: Yes Independently performs ADLs?: Yes (appropriate for developmental age)  Disposition:  Disposition Initial Assessment Completed for this Encounter: Yes  Donell SievertSpencer Simon, PA, recommends overnight observation for safety and stabilization. Hassie BruceKim, AC, and RN informed of disposition.   On Site Evaluation by:   Reviewed with Physician:    Burnetta SabinLatisha D Ellene Bloodsaw, [facility] 01/10/2019 10:30 PM

## 2019-01-11 ENCOUNTER — Encounter (HOSPITAL_COMMUNITY): Payer: Self-pay

## 2019-01-11 ENCOUNTER — Other Ambulatory Visit: Payer: Self-pay

## 2019-01-11 DIAGNOSIS — R440 Auditory hallucinations: Secondary | ICD-10-CM

## 2019-01-11 LAB — COMPREHENSIVE METABOLIC PANEL
ALT: 25 U/L (ref 0–44)
AST: 20 U/L (ref 15–41)
Albumin: 3.7 g/dL (ref 3.5–5.0)
Alkaline Phosphatase: 174 U/L — ABNORMAL HIGH (ref 38–126)
Anion gap: 7 (ref 5–15)
BUN: 14 mg/dL (ref 6–20)
CO2: 24 mmol/L (ref 22–32)
Calcium: 8.8 mg/dL — ABNORMAL LOW (ref 8.9–10.3)
Chloride: 107 mmol/L (ref 98–111)
Creatinine, Ser: 0.48 mg/dL — ABNORMAL LOW (ref 0.61–1.24)
GFR calc Af Amer: >60 mL/min (ref >60)
GFR calc non Af Amer: >60 mL/min (ref >60)
Glucose, Bld: 93 mg/dL (ref 70–99)
Potassium: 3.9 mmol/L (ref 3.5–5.1)
Sodium: 138 mmol/L (ref 135–145)
Total Bilirubin: 0.5 mg/dL (ref 0.3–1.2)
Total Protein: 6.8 g/dL (ref 6.5–8.1)

## 2019-01-11 LAB — CBC
HCT: 38.4 % — ABNORMAL LOW (ref 39.0–52.0)
Hemoglobin: 12.5 g/dL — ABNORMAL LOW (ref 13.0–17.0)
MCH: 29.1 pg (ref 26.0–34.0)
MCHC: 32.6 g/dL (ref 30.0–36.0)
MCV: 89.5 fL (ref 80.0–100.0)
Platelets: 285 10*3/uL (ref 150–400)
RBC: 4.29 MIL/uL (ref 4.22–5.81)
RDW: 12.9 % (ref 11.5–15.5)
WBC: 6.2 10*3/uL (ref 4.0–10.5)
nRBC: 0 % (ref 0.0–0.2)

## 2019-01-11 LAB — URINALYSIS, ROUTINE W REFLEX MICROSCOPIC
Bilirubin Urine: NEGATIVE
Glucose, UA: NEGATIVE mg/dL
Hgb urine dipstick: NEGATIVE
Ketones, ur: NEGATIVE mg/dL
Leukocytes,Ua: NEGATIVE
Nitrite: NEGATIVE
Protein, ur: NEGATIVE mg/dL
Specific Gravity, Urine: 1.028 (ref 1.005–1.030)
pH: 6 (ref 5.0–8.0)

## 2019-01-11 LAB — RAPID URINE DRUG SCREEN, HOSP PERFORMED
Amphetamines: NOT DETECTED
Barbiturates: NOT DETECTED
Benzodiazepines: NOT DETECTED
Cocaine: NOT DETECTED
Opiates: NOT DETECTED
Tetrahydrocannabinol: POSITIVE — AB

## 2019-01-11 LAB — TSH: TSH: 0.883 u[IU]/mL (ref 0.350–4.500)

## 2019-01-11 MED ORDER — TRAZODONE HCL 50 MG PO TABS: 50.0000 mg | ORAL_TABLET | Freq: Every evening | ORAL | 0 refills | Status: DC | PRN

## 2019-01-11 NOTE — BH Assessment (Addendum)
[facility] Assessment Progress Note  Per Juanetta Beets, DO, this pt does not require psychiatric hospitalization at this time.  Pt is to be discharged from [facility] Observation Unit with referral information for [facility] [place].  This has been included in pt's discharge instructions.  Pt's nurse, Jan, has been notified.  Doylene Canning, MA Triage Specialist [phone]   Addendum:  At Dr Kirstie Peri request, this writer added shelter information, through [employer], to pt's discharge instructions.  Because of its proximity to [facility], I also added [facility] as an option for behavioral health services.  Jan has been notified.  Doylene Canning, [place] Behavioral Health Coordinator [phone]

## 2019-01-11 NOTE — Plan of Care (Signed)
[facility]HH Observation Crisis Plan  Reason for Crisis Plan:  Crisis Stabilization   Plan of Care:  Referral for Telepsychiatry/Psychiatric Consult  Family Support:      Current Living Environment:  Living Arrangements: Other relatives  Insurance:   Hospital Account    Name Acct ID Class Status Primary Coverage   [name] [name], [name] [name] [id number] BEHAVIORAL HEALTH OBSERVATION Open [name] FOR MH/DD/SAS - [place] [employer]        Guarantor Account (for Hospital Account [id number])    Name Relation to Pt Service Area Active? Acct Type   [name] [name] Self [facility] Yes Behavioral Health   Address Phone       [place], [place] [address] [phone](H)          Coverage Information (for Hospital Account [id number])    F/O Payor/Plan Precert #   [facility] FOR MH/DD/SAS/[place] [employer]    Subscriber Subscriber #   [name] [name] [id number]   Address Phone   [address] [place], [place] [address] [phone]      Legal Guardian:  Legal Guardian: (self)  Primary Care Provider:  System, Pcp Not In  Current Outpatient Providers:  none  Psychiatrist:  Name of Psychiatrist: (none)  Counselor/Therapist:  Name of Therapist: (none)  Compliant with Medications:  No  Additional Information:   [name] [name] 4/22/202012:48 AM

## 2019-01-11 NOTE — Discharge Instructions (Addendum)
For your behavioral health needs, you are advised to follow up with one of the following service providers.  Call them at your earliest opportunity to schedule an intake appointment;       [facility] [place]      [address]      [place], [place] [address]      [phone]; select Option 5       [facility]      [address]      [place], [place] [address]       [phone]  For your shelter needs, contact [facility].  They have beds available today, but you must go there directly from Behavioral Health:       [facility]      [address]      [place], [place] [address]      [phone]

## 2019-01-11 NOTE — Progress Notes (Signed)
Pt has been asleep most of the morning. Pt reports that he hears the voice of a male and male and says that he has been hearing voices for a while. He denies si and hi.

## 2019-01-11 NOTE — Progress Notes (Signed)
[name] [name] is a 28 year old male being admitted voluntarily to [facility] OBS unit.  He walked in to [facility] complaining auditory hallucinations, intrusive thoughts and increasing anxiety.  Recent stressor is that he was kicked out of housing with his brother a month ago and is currently homeless.  During OBS admission, he was pleasant but bizarre.  Pacing around the unit talking to himself.  He denied SI/HI or visual hallucinations but does admit to hearing voices that are "talking down to me."  He denied any pain or discomfort and appeared to be in no physical distress.  Oriented him to the unit.  BH-OBS paperwork completed and signed.  Belongings secured in tamper resistant bag and placed in locker # 20.  No contraband found.  Skin assessment completed and no skin issues noted.  Q 15 minute checks initiated for safety.

## 2019-01-11 NOTE — BHH Suicide Risk Assessment (Signed)
[facility] Discharge Suicide Risk Assessment   Principal Problem: Auditory hallucination Discharge Diagnoses: Principal Problem:   Auditory hallucination Active Problems:   MDD (major depressive disorder), recurrent, severe, with psychosis (HCC)   Total Time spent with patient: 30 minutes  Musculoskeletal: Strength & Muscle Tone: within normal limits Gait & Station: UTA since patient is lying in bed. Patient leans: N/A  Psychiatric Specialty Exam: Review of Systems  Psychiatric/Behavioral: Positive for hallucinations (chronic AVH) and substance abuse. Negative for suicidal ideas.  All other systems reviewed and are negative.   Blood pressure 104/83, pulse (!) 59, temperature 98.9 F (37.2 C), resp. rate 16, SpO2 100 %.There is no height or weight on file to calculate BMI.  General Appearance: Fairly Groomed, young, African American male with short afro hair who is lying in bed under the covers. NAD.   Eye Contact::  Fair  Speech:  Clear and Coherent and Normal Rate  Volume:  Normal  Mood:  Anxious  Affect:  Constricted  Thought Process:  Goal Directed, Linear and Descriptions of Associations: Intact  Orientation:  Full (Time, Place, and Person)  Thought Content:  Logical  Suicidal Thoughts:  No  Homicidal Thoughts:  No  Memory:  Immediate;   Good Recent;   Good Remote;   Good  Judgement:  Fair  Insight:  Fair  Psychomotor Activity:  Normal  Concentration:  Good  Recall:  Good  Fund of Knowledge:Good  Language: Good  Akathisia:  No  Handed:  Right  AIMS (if indicated):   N/A  Assets:  Communication Skills Desire for Improvement Physical Health Resilience  Sleep:   Okay  Cognition: WNL  ADL's:  Intact   Mental Status Per Nursing Assessment::   On Admission:  NA  Demographic Factors:  Male, Adolescent or young adult, Low socioeconomic status, Living alone and Unemployed  Loss Factors: Financial problems/change in socioeconomic status  Historical  Factors: NA  Risk Reduction Factors:   NA  Continued Clinical Symptoms:  Chronic AVH  Cognitive Features That Contribute To Risk:  None    Suicide Risk:  Minimal: No identifiable suicidal ideation.  Patients presenting with no risk factors but with morbid ruminations; may be classified as minimal risk based on the severity of the depressive symptoms    Plan Of Care/Follow-up recommendations:  -Discharge home.  -Patient will be provided with outpatient mental health resources for therapy and medication management.   Cherly Beach, DO 01/11/2019, 1:08 PM

## 2019-01-11 NOTE — Progress Notes (Signed)
Pt d/c from the hospital. All items returned. D/C instructions given, prescriptions given and suicide information given.

## 2019-01-11 NOTE — Discharge Summary (Signed)
Physician Discharge Summary Note  Patient:  [name] [name] is an 28 y.o., male MRN:  [id number] DOB:  1990/11/03 Patient phone:  [phone] (home)  [place]   [place] [id number],  Total Time spent with patient: 30 minutes  Date of Admission:  01/10/2019 Date of Discharge: 01/11/19  Reason for Admission: Chronic AVH and anxiety.   Principal Problem: Auditory hallucination Discharge Diagnoses: Principal Problem:   Auditory hallucination Active Problems:   MDD (major depressive disorder), recurrent, severe, with psychosis (HCC)   Past Psychiatric History: Schizophrenia and MDD.   Past Medical History:  Past Medical History:  Diagnosis Date  . Schizophrenia (HCC)   . Tibial plateau fracture, left     Past Surgical History:  Procedure Laterality Date  . LAPAROSCOPIC APPENDECTOMY N/A 07/16/2017   Procedure: APPENDECTOMY LAPAROSCOPIC;  Surgeon: Axel Filleramirez, Armando, MD;  [facility];  Service: General;  Laterality: N/A;  . LAPAROTOMY N/A 07/16/2017   Procedure: POSSIBLE OPEN  LAPAROTOMY;  Surgeon: Axel Filleramirez, Armando, MD;  [facility];  Service: General;  Laterality: N/A;  . NO PAST SURGERIES    . ORIF TIBIA PLATEAU Left 04/15/2018   Procedure: OPEN REDUCTION INTERNAL FIXATION (ORIF) LEFT BICONDYLAR TIBIAL PLATEAU;  Surgeon: Tarry KosXu, Naiping M, MD;  [facility];  Service: Orthopedics;  Laterality: Left;   Family History:  Family History  Problem Relation Age of Onset  . Other Mother   . Other Brother    Family Psychiatric  History: Denies  Social History:  Social History   Substance and Sexual Activity  Alcohol Use No  . Frequency: Never     Social History   Substance and Sexual Activity  Drug Use Yes  . Types: Marijuana   Comment: denies use of marijuana     Social History   Socioeconomic History  . Marital status: Single    Spouse name: Not on file  . Number of children: Not on file  . Years of education: Not on file  . Highest  education level: Not on file  Occupational History  . Not on file  Social Needs  . Financial resource strain: Not on file  . Food insecurity:    Worry: Not on file    Inability: Not on file  . Transportation needs:    Medical: Not on file    Non-medical: Not on file  Tobacco Use  . Smoking status: Current Some Day Smoker    Types: Cigarettes  . Smokeless tobacco: Never Used  Substance and Sexual Activity  . Alcohol use: No    Frequency: Never  . Drug use: Yes    Types: Marijuana    Comment: denies use of marijuana   . Sexual activity: Not on file  Lifestyle  . Physical activity:    Days per week: Not on file    Minutes per session: Not on file  . Stress: Not on file  Relationships  . Social connections:    Talks on phone: Not on file    Gets together: Not on file    Attends religious service: Not on file    Active member of club or organization: Not on file    Attends meetings of clubs or organizations: Not on file    Relationship status: Not on file  Other Topics Concern  . Not on file  Social History Narrative  . Not on file    Hospital Course:   Mr. Ross MarcusSherwood presented to the hospital with chronic AVH and worsening anxiety. Today,  he reports worsening anxiety for the past 2 years. He also endorses ongoing AVH. He denies CAH to harm himself or others but mostly derogatory comments. He has a diagnosis of schizophrenia. He uses marijuana 1-2 times weekly. He does not currently take medications or see a psychiatrist. He reports that Zyprexa and Haldol have been ineffective for hallucinations or anxiety in the past. He is agreeable to establishing care with a provider. He denies SI or HI. He denies problems with sleep or appetite. He has been homeless for the past month. He was previously living with his twin brother but he was asked to leave since he is not on the lease. He will be provided with outpatient mental health resources and shelter resources. He feels safe for  discharge with these resources.   Physical Findings: AIMS: Facial and Oral Movements Muscles of Facial Expression: None, normal Lips and Perioral Area: None, normal Jaw: None, normal Tongue: None, normal,Extremity Movements Upper (arms, wrists, hands, fingers): None, normal Lower (legs, knees, ankles, toes): None, normal, Trunk Movements Neck, shoulders, hips: None, normal, Overall Severity Severity of abnormal movements (highest score from questions above): None, normal Incapacitation due to abnormal movements: None, normal Patient's awareness of abnormal movements (rate only patient's report): No Awareness, Dental Status Current problems with teeth and/or dentures?: No Does patient usually wear dentures?: No  CIWA:   N/A COWS:   N/A  Musculoskeletal: Strength & Muscle Tone: within normal limits Gait & Station: UTA since patient is lying in bed. Patient leans: N/A  Psychiatric Specialty Exam: Physical Exam  Nursing note and vitals reviewed. Constitutional: He is oriented to person, place, and time. He appears well-developed and well-nourished.  HENT:  Head: Normocephalic and atraumatic.  Neck: Normal range of motion.  Respiratory: Effort normal.  Musculoskeletal: Normal range of motion.  Neurological: He is alert and oriented to person, place, and time.  Psychiatric: His speech is normal and behavior is normal. Judgment and thought content normal. Cognition and memory are normal. He exhibits a depressed mood.    Review of Systems  Psychiatric/Behavioral: Positive for depression, hallucinations (Chronic AVH) and substance abuse. Negative for suicidal ideas.  All other systems reviewed and are negative.   Blood pressure 104/83, pulse (!) 59, temperature 98.9 F (37.2 C), resp. rate 16, SpO2 100 %.There is no height or weight on file to calculate BMI.  General Appearance: Fairly Groomed, young, African American male with short afro hair who is lying in bed under the covers.  NAD.   Eye Contact:  Fair  Speech:  Clear and Coherent and Normal Rate  Volume:  Normal  Mood:  Anxious  Affect:  Constricted  Thought Process:  Goal Directed, Linear and Descriptions of Associations: Intact  Orientation:  Full (Time, Place, and Person)  Thought Content:  Logical  Suicidal Thoughts:  No  Homicidal Thoughts:  No  Memory:  Immediate;   Good Recent;   Good Remote;   Good  Judgement:  Fair  Insight:  Fair  Psychomotor Activity:  Normal  Concentration:  Concentration: Good and Attention Span: Good  Recall:  Good  Fund of Knowledge:  Good  Language:  Good  Akathisia:  No  Handed:  Right  AIMS (if indicated):   N/A  Assets:  Communication Skills Desire for Improvement Physical Health Resilience  ADL's:  Intact  Cognition:  WNL  Sleep:   Okay     Have you used any form of tobacco in the last 30 days? (Cigarettes, Smokeless Tobacco, Cigars,  and/or Pipes): Yes  Has this patient used any form of tobacco in the last 30 days? (Cigarettes, Smokeless Tobacco, Cigars, and/or Pipes) Yes, N/A  Blood Alcohol level:  Lab Results  Component Value Date   ETH <10 12/13/2018   ETH <10 09/03/2018    Metabolic Disorder Labs:  No results found for: HGBA1C, MPG Lab Results  Component Value Date   PROLACTIN 5.6 01/14/2015   No results found for: CHOL, TRIG, HDL, CHOLHDL, VLDL, LDLCALC  See Psychiatric Specialty Exam and Suicide Risk Assessment completed by Attending Physician prior to discharge.  Discharge destination:  Home  Is patient on multiple antipsychotic therapies at discharge:  No   Has Patient had three or more failed trials of antipsychotic monotherapy by history:  No  Recommended Plan for Multiple Antipsychotic Therapies: NA  Discharge Instructions    Diet - low sodium heart healthy   Complete by:  As directed    Increase activity slowly   Complete by:  As directed      Allergies as of 01/11/2019   No Known Allergies     Medication List     TAKE these medications     Indication  traZODone 50 MG tablet Commonly known as:  DESYREL Take 50 mg by mouth at bedtime as needed for sleep.  Indication:  Trouble Sleeping        Follow-up recommendations: Patient will be provided with outpatient mental health services for medication management and therapy.  Comments:  N/A  Signed: Cherly Beach, DO 01/11/2019, 1:15 PM

## 2019-10-06 ENCOUNTER — Other Ambulatory Visit: Payer: Self-pay | Admitting: Critical Care Medicine

## 2019-10-06 DIAGNOSIS — Z20822 Contact with and (suspected) exposure to covid-19: Secondary | ICD-10-CM

## 2019-10-07 LAB — NOVEL CORONAVIRUS, NAA: SARS-CoV-2, NAA: NOT DETECTED

## 2019-10-09 ENCOUNTER — Ambulatory Visit: Payer: Self-pay | Admitting: Family Medicine

## 2019-10-26 ENCOUNTER — Ambulatory Visit: Payer: Self-pay | Attending: Nurse Practitioner | Admitting: Nurse Practitioner

## 2019-10-26 ENCOUNTER — Encounter: Payer: Self-pay | Admitting: Nurse Practitioner

## 2019-10-26 ENCOUNTER — Other Ambulatory Visit: Payer: Self-pay

## 2019-10-26 DIAGNOSIS — F209 Schizophrenia, unspecified: Secondary | ICD-10-CM

## 2019-10-26 DIAGNOSIS — F17210 Nicotine dependence, cigarettes, uncomplicated: Secondary | ICD-10-CM

## 2019-10-26 DIAGNOSIS — R440 Auditory hallucinations: Secondary | ICD-10-CM

## 2019-10-26 DIAGNOSIS — F17200 Nicotine dependence, unspecified, uncomplicated: Secondary | ICD-10-CM

## 2019-10-26 DIAGNOSIS — Z7689 Persons encountering health services in other specified circumstances: Secondary | ICD-10-CM

## 2019-10-26 NOTE — Progress Notes (Signed)
Virtual Visit via Telephone Note Due to national recommendations of social distancing due to [place] 19, telehealth visit is felt to be most appropriate for this patient at this time.  I discussed the limitations, risks, security and privacy concerns of performing an evaluation and management service by telephone and the availability of in person appointments. I also discussed with the patient that there may be a patient responsible charge related to this service. The patient expressed understanding and agreed to proceed.    I connected with [name] [name] [name] on 10/27/19  at   2:10 PM EST  EDT by telephone and verified that I am speaking with the correct person using two identifiers.   Consent I discussed the limitations, risks, security and privacy concerns of performing an evaluation and management service by telephone and the availability of in person appointments. I also discussed with the patient that there may be a patient responsible charge related to this service. The patient expressed understanding and agreed to proceed.   Location of Patient: Private  Residence   Location of Provider: [place] participating in Telemedicine visit: [name] Rankins FNP-BC [place]    History of Present Illness: Telemedicine visit for: Establish Care  has a past medical history of Schizophrenia ([name]) and Tibial plateau fracture, left. Schizophrenia and MDD  History of Schizophrenia. His case manager [name] [name] [name] is on the phone assisting him today with HPI. [name] [name] [name] is Requesting abilify however [name] [name] [name] states another Tourist information centre manager took him to [place] and his abilify is ready at their f\pharmacy. He endorses auditory hallucinations which are occurring mostly at night. Will need to continue to see psychiatry through [facility]. He has had multiple admissions to the ED for his mental health disorder which also includes aggressive behaviors, psychosis, and  suicideal thoughts/ideation. Denies any history of DM, HTN or thyroid disorder.       Past Medical History:  Diagnosis Date  . Schizophrenia ([place])   . Tibial plateau fracture, left     Past Surgical History:  Procedure Laterality Date  . APPENDECTOMY    . LAPAROSCOPIC APPENDECTOMY N/A 07/16/2017   Procedure: APPENDECTOMY LAPAROSCOPIC;  Surgeon: Ralene Ok, MD;  Location: [place];  Service: General;  Laterality: N/A;  . LAPAROTOMY N/A 07/16/2017   Procedure: POSSIBLE OPEN  LAPAROTOMY;  Surgeon: Ralene Ok, MD;  Location: [place];  Service: General;  Laterality: N/A;  . NO PAST SURGERIES    . ORIF TIBIA PLATEAU Left 04/15/2018   Procedure: OPEN REDUCTION INTERNAL FIXATION (ORIF) LEFT BICONDYLAR TIBIAL PLATEAU;  Surgeon: Leandrew Koyanagi, MD;  Location: [place];  Service: Orthopedics;  Laterality: Left;    Family History  Problem Relation Age of Onset  . Other Mother   . Throat cancer Mother   . Other Brother     Social History   Socioeconomic History  . Marital status: Single    Spouse name: Not on file  . Number of children: Not on file  . Years of education: Not on file  . Highest education level: Not on file  Occupational History  . Not on file  Tobacco Use  . Smoking status: Current Some Day Smoker    Types: Cigarettes  . Smokeless tobacco: Never Used  Substance and Sexual Activity  . Alcohol use: Yes  . Drug use: Not Currently    Types: Marijuana    Comment: denies use of marijuana   . Sexual activity: Not Currently  Other  Topics Concern  . Not on file  Social History Narrative  . Not on file   Social Determinants of Health   Financial Resource Strain:   . Difficulty of Paying Living Expenses: Not on file  Food Insecurity:   . Worried About Programme researcher, broadcasting/film/video in the Last Year: Not on file  . Ran Out of Food in the Last Year: Not on file  Transportation Needs:   . Lack of Transportation (Medical): Not on file  . Lack of Transportation (Non-Medical):  Not on file  Physical Activity:   . Days of Exercise per Week: Not on file  . Minutes of Exercise per Session: Not on file  Stress:   . Feeling of Stress : Not on file  Social Connections:   . Frequency of Communication with Friends and Family: Not on file  . Frequency of Social Gatherings with Friends and Family: Not on file  . Attends Religious Services: Not on file  . Active Member of Clubs or Organizations: Not on file  . Attends Banker Meetings: Not on file  . Marital Status: Not on file     Observations/Objective: Awake, alert and oriented x 3   Review of Systems  Constitutional: Negative for fever, malaise/fatigue and weight loss.  HENT: Negative.  Negative for nosebleeds.   Eyes: Negative.  Negative for blurred vision, double vision and photophobia.  Respiratory: Negative.  Negative for cough and shortness of breath.   Cardiovascular: Negative.  Negative for chest pain, palpitations and leg swelling.  Gastrointestinal: Negative.  Negative for heartburn, nausea and vomiting.  Musculoskeletal: Negative.  Negative for myalgias.  Neurological: Negative.  Negative for dizziness, focal weakness, seizures and headaches.  Psychiatric/Behavioral: Positive for hallucinations. Negative for suicidal ideas.       SEE HPI    Assessment and Plan: [name] [name] was seen today for establish care.  Diagnoses and all orders for this visit:  Encounter to establish care  Tobacco dependence [name] [name] was counseled on the dangers of tobacco use, and was advised to quit. Reviewed strategies to maximize success, including removing cigarettes and smoking materials from environment, stress management and support of family/friends as well as pharmacological alternatives including: Wellbutrin, Chantix, Nicotine patch, Nicotine gum or lozenges. Smoking cessation support: smoking cessation hotline: 1-800-QUIT-NOW.  Smoking cessation classes are also available through [facility]. Call [phone] or visit our website at [email or web address].   A total of 2 minutes was spent on counseling for smoking cessation and [name] [name] is not ready to quit.   Schizophrenia, unspecified type [name]) Auditory hallucination Continue follow up with psychiatry at [facility]    Follow Up Instructions Return in about 3 months (around 01/23/2020).     I discussed the assessment and treatment plan with the patient. The patient was provided an opportunity to ask questions and all were answered. The patient agreed with the plan and demonstrated an understanding of the instructions.   The patient was advised to call back or seek an in-person evaluation if the symptoms worsen or if the condition fails to improve as anticipated.  I provided 15 minutes of non-face-to-face time during this encounter including median intraservice time, reviewing previous notes, labs, imaging, medications and explaining diagnosis and management.  Claiborne Rigg, FNP-BC

## 2019-10-27 ENCOUNTER — Encounter: Payer: Self-pay | Admitting: Nurse Practitioner

## 2019-11-01 ENCOUNTER — Other Ambulatory Visit: Payer: Self-pay

## 2019-11-01 ENCOUNTER — Ambulatory Visit: Payer: Self-pay | Attending: Nurse Practitioner

## 2019-11-01 ENCOUNTER — Other Ambulatory Visit: Payer: Self-pay | Admitting: Nurse Practitioner

## 2019-11-01 DIAGNOSIS — R748 Abnormal levels of other serum enzymes: Secondary | ICD-10-CM

## 2019-11-01 DIAGNOSIS — Z131 Encounter for screening for diabetes mellitus: Secondary | ICD-10-CM

## 2019-11-01 DIAGNOSIS — E785 Hyperlipidemia, unspecified: Secondary | ICD-10-CM

## 2019-11-01 DIAGNOSIS — Z130 Encounter for screening for diseases of the blood and blood-forming organs and certain disorders involving the immune mechanism: Secondary | ICD-10-CM

## 2019-11-02 LAB — CBC
Hematocrit: 39.2 % (ref 37.5–51.0)
Hemoglobin: 13 g/dL (ref 13.0–17.7)
MCH: 28 pg (ref 26.6–33.0)
MCHC: 33.2 g/dL (ref 31.5–35.7)
MCV: 84 fL (ref 79–97)
Platelets: 309 10*3/uL (ref 150–450)
RBC: 4.65 x10E6/uL (ref 4.14–5.80)
RDW: 13.8 % (ref 11.6–15.4)
WBC: 7.6 10*3/uL (ref 3.4–10.8)

## 2019-11-02 LAB — CMP14+EGFR
ALT: 37 IU/L (ref 0–44)
AST: 22 IU/L (ref 0–40)
Albumin/Globulin Ratio: 1.6 (ref 1.2–2.2)
Albumin: 4.2 g/dL (ref 4.1–5.2)
Alkaline Phosphatase: 260 IU/L — ABNORMAL HIGH (ref 39–117)
BUN/Creatinine Ratio: 17 (ref 9–20)
BUN: 10 mg/dL (ref 6–20)
Bilirubin Total: 0.3 mg/dL (ref 0.0–1.2)
CO2: 19 mmol/L — ABNORMAL LOW (ref 20–29)
Calcium: 9.1 mg/dL (ref 8.7–10.2)
Chloride: 107 mmol/L — ABNORMAL HIGH (ref 96–106)
Creatinine, Ser: 0.58 mg/dL — ABNORMAL LOW (ref 0.76–1.27)
GFR calc Af Amer: 162 mL/min/{1.73_m2} (ref >59)
GFR calc non Af Amer: 140 mL/min/{1.73_m2} (ref >59)
Globulin, Total: 2.6 g/dL (ref 1.5–4.5)
Glucose: 95 mg/dL (ref 65–99)
Potassium: 4.9 mmol/L (ref 3.5–5.2)
Sodium: 140 mmol/L (ref 134–144)
Total Protein: 6.8 g/dL (ref 6.0–8.5)

## 2019-11-02 LAB — LIPID PANEL
Chol/HDL Ratio: 4.7 ratio (ref 0.0–5.0)
Cholesterol, Total: 178 mg/dL (ref 100–199)
HDL: 38 mg/dL — ABNORMAL LOW (ref >39)
LDL Chol Calc (NIH): 117 mg/dL — ABNORMAL HIGH (ref 0–99)
Triglycerides: 126 mg/dL (ref 0–149)
VLDL Cholesterol Cal: 23 mg/dL (ref 5–40)

## 2019-11-02 LAB — HEMOGLOBIN A1C
Est. average glucose Bld gHb Est-mCnc: 111 mg/dL
Hgb A1c MFr Bld: 5.5 % (ref 4.8–5.6)

## 2019-11-05 ENCOUNTER — Other Ambulatory Visit: Payer: Self-pay | Admitting: Nurse Practitioner

## 2019-11-05 DIAGNOSIS — R7401 Elevation of levels of liver transaminase levels: Secondary | ICD-10-CM

## 2019-11-13 ENCOUNTER — Other Ambulatory Visit: Payer: Medicaid Other

## 2019-11-27 ENCOUNTER — Ambulatory Visit: Payer: Self-pay | Attending: Nurse Practitioner | Admitting: Nurse Practitioner

## 2019-11-27 ENCOUNTER — Other Ambulatory Visit: Payer: Self-pay

## 2019-11-27 ENCOUNTER — Encounter: Payer: Self-pay | Admitting: Nurse Practitioner

## 2019-11-27 ENCOUNTER — Ambulatory Visit (HOSPITAL_BASED_OUTPATIENT_CLINIC_OR_DEPARTMENT_OTHER): Payer: Self-pay | Admitting: Pharmacist

## 2019-11-27 VITALS — BP 133/76 | HR 94 | Temp 95.5°F | Ht 66.0 in | Wt 264.0 lb

## 2019-11-27 DIAGNOSIS — Z114 Encounter for screening for human immunodeficiency virus [HIV]: Secondary | ICD-10-CM

## 2019-11-27 DIAGNOSIS — R030 Elevated blood-pressure reading, without diagnosis of hypertension: Secondary | ICD-10-CM

## 2019-11-27 DIAGNOSIS — Z23 Encounter for immunization: Secondary | ICD-10-CM

## 2019-11-27 NOTE — Progress Notes (Signed)
Assessment & Plan:  [name] [name] was seen today for blood pressure check.  Diagnoses and all orders for this visit:  Encounter for screening for HIV -     HIV antibody (with reflex)    Patient has been counseled on age-appropriate routine health concerns for screening and prevention. These are reviewed and up-to-date. Referrals have been placed accordingly. Immunizations are up-to-date or declined.    Subjective:   Chief Complaint  Patient presents with  . Blood Pressure Check    Pt. is here for blood pressure check.    HPI [name] [name] 29 y.o. male presents to office today for follow up to Blood pressure check. Well controlled today. Denies chest pain, shortness of breath, palpitations, lightheadedness, dizziness, headaches or BLE edema. He does not have a device at home to monitor his blood pressure BP Readings from Last 3 Encounters:  11/27/19 133/76  12/14/18 121/74  09/03/18 (!) 123/92    He has been taking abilify for the past several weeks. Still with auditory and visual hallucinations however they are not as profound compared to a few weeks ago. Has an upcoming appt with [facility] for follow up to Abilify.     Review of Systems  Constitutional: Negative for fever, malaise/fatigue and weight loss.  HENT: Negative.  Negative for nosebleeds.   Eyes: Negative.  Negative for blurred vision, double vision and photophobia.  Respiratory: Negative.  Negative for cough and shortness of breath.   Cardiovascular: Negative.  Negative for chest pain, palpitations and leg swelling.  Gastrointestinal: Negative.  Negative for heartburn, nausea and vomiting.  Musculoskeletal: Negative.  Negative for myalgias.  Neurological: Negative.  Negative for dizziness, focal weakness, seizures and headaches.  Psychiatric/Behavioral: Positive for hallucinations. Negative for suicidal ideas.    Past Medical History:  Diagnosis Date  . Schizophrenia ([place])   . Tibial plateau fracture, left      Past Surgical History:  Procedure Laterality Date  . APPENDECTOMY    . LAPAROSCOPIC APPENDECTOMY N/A 07/16/2017   Procedure: APPENDECTOMY LAPAROSCOPIC;  Surgeon: Ralene Ok, MD;  Location: [place];  Service: General;  Laterality: N/A;  . LAPAROTOMY N/A 07/16/2017   Procedure: POSSIBLE OPEN  LAPAROTOMY;  Surgeon: Ralene Ok, MD;  Location: [place];  Service: General;  Laterality: N/A;  . NO PAST SURGERIES    . ORIF TIBIA PLATEAU Left 04/15/2018   Procedure: OPEN REDUCTION INTERNAL FIXATION (ORIF) LEFT BICONDYLAR TIBIAL PLATEAU;  Surgeon: Leandrew Koyanagi, MD;  Location: [place];  Service: Orthopedics;  Laterality: Left;    Family History  Problem Relation Age of Onset  . Other Mother   . Throat cancer Mother   . Other Brother     Social History Reviewed with no changes to be made today.   Outpatient Medications Prior to Visit  Medication Sig Dispense Refill  . ARIPiprazole (ABILIFY) 5 MG tablet Take 5 mg by mouth daily. Take 1/2 a day for 7 days, then increase to one whole tablet    . traZODone (DESYREL) 50 MG tablet Take 50 mg by mouth at bedtime as needed for sleep.    . traZODone (DESYREL) 50 MG tablet Take 1 tablet (50 mg total) by mouth at bedtime and may repeat dose one time if needed. (Patient not taking: Reported on 10/26/2019) 14 tablet 0   No facility-administered medications prior to visit.    No Known Allergies     Objective:    BP 133/76 (BP Location: Right Arm, Patient Position: Sitting, Cuff Size: Large)  Pulse 94   Temp (!) 95.5 F (35.3 C) (Temporal)   Ht 5\' 6"  (1.676 m)   Wt 264 lb (119.7 kg)   SpO2 98%   BMI 42.61 kg/m  Wt Readings from Last 3 Encounters:  11/27/19 264 lb (119.7 kg)  04/15/18 140 lb (63.5 kg)  04/11/18 140 lb (63.5 kg)    Physical Exam Vitals and nursing note reviewed.  Constitutional:      Appearance: He is well-developed.  HENT:     Head: Normocephalic and atraumatic.  Cardiovascular:     Rate and Rhythm: Normal rate  and regular rhythm.     Heart sounds: Normal heart sounds. No murmur. No friction rub. No gallop.   Pulmonary:     Effort: Pulmonary effort is normal. No tachypnea or respiratory distress.     Breath sounds: Normal breath sounds. No decreased breath sounds, wheezing, rhonchi or rales.  Chest:     Chest wall: No tenderness.  Abdominal:     General: Bowel sounds are normal.     Palpations: Abdomen is soft.  Musculoskeletal:        General: Normal range of motion.     Cervical back: Normal range of motion.  Skin:    General: Skin is warm and dry.  Neurological:     Mental Status: He is alert and oriented to person, place, and time.     Coordination: Coordination normal.  Psychiatric:        Behavior: Behavior normal. Behavior is cooperative.        Thought Content: Thought content normal.        Judgment: Judgment normal.          Patient has been counseled extensively about nutrition and exercise as well as the importance of adherence with medications and regular follow-up. The patient was given clear instructions to go to ER or return to medical center if symptoms don't improve, worsen or new problems develop. The patient verbalized understanding.   Follow-up: Return in about 3 months (around 02/27/2020).   04/28/2020, FNP-BC [facility] [place], [place]   11/27/2019, 12:58 PM

## 2019-11-27 NOTE — Progress Notes (Signed)
Patient presents for vaccination against influenza per orders of Zelda. Consent given. Counseling provided. No contraindications exists. Vaccine administered without incident.   

## 2019-11-28 LAB — HIV ANTIBODY (ROUTINE TESTING W REFLEX): HIV Screen 4th Generation wRfx: NONREACTIVE

## 2019-12-08 ENCOUNTER — Ambulatory Visit: Payer: Medicaid Other | Admitting: Nurse Practitioner

## 2020-01-23 ENCOUNTER — Ambulatory Visit: Payer: Medicaid Other | Admitting: Nurse Practitioner

## 2020-02-27 ENCOUNTER — Ambulatory Visit: Payer: Medicaid Other | Admitting: Nurse Practitioner

## 2020-02-27 ENCOUNTER — Ambulatory Visit: Payer: Medicaid Other | Attending: Nurse Practitioner | Admitting: Nurse Practitioner

## 2020-02-27 ENCOUNTER — Other Ambulatory Visit: Payer: Self-pay

## 2020-03-14 ENCOUNTER — Telehealth (HOSPITAL_COMMUNITY): Payer: Self-pay | Admitting: Psychiatric/Mental Health

## 2020-03-14 ENCOUNTER — Other Ambulatory Visit: Payer: Self-pay

## 2020-03-14 DIAGNOSIS — F251 Schizoaffective disorder, depressive type: Secondary | ICD-10-CM | POA: Insufficient documentation

## 2020-03-20 ENCOUNTER — Ambulatory Visit (HOSPITAL_COMMUNITY)
Admission: EM | Admit: 2020-03-20 | Discharge: 2020-03-20 | Disposition: A | Discharge status: Home / Self Care | Payer: No Payment, Other | Attending: Psychiatry | Admitting: Psychiatry

## 2020-03-20 ENCOUNTER — Other Ambulatory Visit: Payer: Self-pay

## 2020-03-20 DIAGNOSIS — F333 Major depressive disorder, recurrent, severe with psychotic symptoms: Secondary | ICD-10-CM | POA: Insufficient documentation

## 2020-03-20 DIAGNOSIS — F419 Anxiety disorder, unspecified: Secondary | ICD-10-CM | POA: Insufficient documentation

## 2020-03-20 DIAGNOSIS — F19959 Other psychoactive substance use, unspecified with psychoactive substance-induced psychotic disorder, unspecified: Secondary | ICD-10-CM | POA: Insufficient documentation

## 2020-03-20 NOTE — ED Notes (Signed)
Patient belongings are secure in locker 32.

## 2020-03-20 NOTE — BH Assessment (Signed)
Comprehensive Clinical Assessment (CCA) Note  03/20/2020 [name] [name] [id number]  Visit Diagnosis:      ICD-10-CM   1. Severe episode of recurrent major depressive disorder, with psychotic features (HCC)  F33.3       CCA Screening, Triage and Referral (STR)  Patient Reported Information How did you hear about [place]? Other (Comment)  Referral name: PST Community Support Team -- [employer]  Referral phone number: [phone]   Whom do you see for routine medical problems? Primary Care  Practice/Facility Name: No data recorded Practice/Facility Phone Number: No data recorded Name of Contact: No data recorded Contact Number: No data recorded Contact Fax Number: No data recorded Prescriber Name: No data recorded Prescriber Address (if known): No data recorded  What Is the Reason for Your Visit/Call Today? Pt seeks enrollment in outpatient med management and therapy services; stepping down from CST  How Long Has This Been Causing You Problems? > than 6 months  What Do You Feel Would Help You the Most Today? Therapy;Medication   Have You Recently Been in Any Inpatient Treatment (Hospital/Detox/Crisis Center/28-Day Program)? No  Name/Location of Program/Hospital:No data recorded How Long Were You There? No data recorded When Were You Discharged? No data recorded  Have You Ever Received Services From [facility] Before? No  Who Do You See at [facility]? No data recorded  Have You Recently Had Any Thoughts About Hurting Yourself? No  Are You Planning to Commit Suicide/Harm Yourself At This time? No   Have you Recently Had Thoughts About Hurting Someone [name]? No  Explanation: No data recorded  Have You Used Any Alcohol or Drugs in the Past 24 Hours? No  How Long Ago Did You Use Drugs or Alcohol? No data recorded What Did You Use and How Much? No data recorded  Do You Currently Have a Therapist/Psychiatrist? Yes  Name of Therapist/Psychiatrist: [name] [name] ceased  outpatient services)   Have You Been Recently Discharged From Any Public relations account executive or Programs? Yes  Explanation of Discharge From Practice/Program: Discharging from PST Community Support Team     CCA Screening Triage Referral Assessment Type of Contact: Face-to-Face  Is this Initial or Reassessment? No data recorded Date Telepsych consult ordered in CHL:  No data recorded Time Telepsych consult ordered in CHL:  No data recorded  Patient Reported Information Reviewed? Yes  Patient Left Without Being Seen? No data recorded Reason for Not Completing Assessment: No data recorded  Collateral Involvement: No data recorded  Does Patient Have a Court Appointed Legal Guardian? No data recorded Name and Contact of Legal Guardian: No data recorded If Minor and Not Living with Parent(s), Who has Custody? No data recorded Is CPS involved or ever been involved? Never  Is APS involved or ever been involved? Never   Patient Determined To Be At Risk for Harm To Self or Others Based on Review of Patient Reported Information or Presenting Complaint? No  Method: No data recorded Availability of Means: No data recorded Intent: No data recorded Notification Required: No data recorded Additional Information for Danger to Others Potential: No data recorded Additional Comments for Danger to Others Potential: No data recorded Are There Guns or Other Weapons in Your Home? No data recorded Types of Guns/Weapons: No data recorded Are These Weapons Safely Secured?                            No data recorded Who Could Verify You Are Able To  Have These Secured: No data recorded Do You Have any Outstanding Charges, Pending Court Dates, Parole/Probation? No data recorded Contacted To Inform of Risk of Harm To Self or Others: No data recorded  [facility]   Does Patient Present under Involuntary Commitment? No  IVC Papers Initial File Date: No data  recorded  [place]   Patient Currently Receiving the Following Services: CST Media planner(Community Support Team) (PST CST; recently with [facility])   Determination of Need: Urgent (48 hours)   Options For Referral: Medication Management;Outpatient Therapy     CCA Biopsychosocial  Intake/Chief Complaint:   Pt is a 29 year old male [facility] (and accompanied by [name], his case Production designer, theatre/television/filmmanager for PST [facility] Support [facility]) for [facility] and [facility].  Pt is stepping down from CST services.  Until recently, [facility]  As [facility] stopped those services, Pt has been off of his medication.  Pt is employed.  Pt reported that he would like to begin outpatient psychiatric and therapy services.  He denied suicidal ideation, homicidal ideation, substance use, and self-injurious behavior.  Pt endorsed ongoing auditory and visual hallucinations, which is baseline for him.  Pt described his mood was anxious, and he appeared anxious.  Pt stated that he would be safe if he left [facility].  PT was last assessed by TTS in April 2020.  At that time, Pt presented to the ED with complaint of hallucination. [facility]  Mental Health Symptoms Depression:  Depression: None  Mania:  Mania: None  Anxiety:   Anxiety: Restlessness, Tension, Worrying  Psychosis:  Psychosis: Hallucinations  Trauma:  Trauma: None  Obsessions:  Obsessions: None  Compulsions:  Compulsions: N/A  Inattention:  Inattention: None  Hyperactivity/Impulsivity:  Hyperactivity/Impulsivity: N/A  Oppositional/Defiant Behaviors:  Oppositional/Defiant Behaviors: None  Emotional Irregularity:  Emotional Irregularity: None  Other Mood/Personality Symptoms:      Mental Status Exam Appearance and self-care  Stature:  Stature: Average  Weight:  Weight: Overweight  Clothing:  Clothing: Casual   Grooming:  Grooming: Normal  Cosmetic use:  Cosmetic Use: Age appropriate  Posture/gait:  Posture/Gait: Normal  Motor activity:  Motor Activity: Not Remarkable  Sensorium  Attention:  Attention: Normal  Concentration:  Concentration: Normal  Orientation:  Orientation: X5  Recall/memory:  Recall/Memory: Normal  Affect and Mood  Affect:  Affect: Anxious  Mood:  Mood: Anxious  Relating  Eye contact:  Eye Contact: Normal  Facial expression:  Facial Expression: Tense  Attitude toward examiner:     Thought and Language  Speech flow: Speech Flow: Clear and Coherent  Thought content:  Thought Content: Appropriate to Mood and Circumstances  Preoccupation:     Hallucinations:  Hallucinations: Auditory, Visual  Organization:     Company secretaryxecutive Functions  Fund of Knowledge:  Fund of Knowledge: Average  Intelligence:  Intelligence: Average  Abstraction:  Abstraction: Development worker, international aidConcrete  Judgement:  Judgement: Fair  Dance movement psychotherapisteality Testing:  Reality Testing: Adequate  Insight:  Insight: Fair  Decision Making:  Decision Making: Only simple  Social Functioning  Social Maturity:  Social Maturity: Isolates  Social Judgement:  Social Judgement: Normal  Stress  Stressors:     Coping Ability:  Coping Ability: Normal  Skill Deficits:  Skill Deficits: Responsibility, Self-care  Supports:  Supports: Other (Comment) (PST CST)     Religion: Religion/Spirituality Are You A Religious  Person?: No  Leisure/Recreation: Leisure / Recreation Do You Have Hobbies?: Yes Leisure and Hobbies: Skateboarding, gaming  Exercise/Diet: Exercise/Diet Do You Exercise?: No Have You Gained or Lost A Significant Amount of Weight in the Past Six Months?: No Do You Follow a Special Diet?: No Do You Have Any Trouble Sleeping?: No   CCA Employment/Education  Employment/Work Situation: Employment / Work Situation Employment situation: Employed Has patient ever been in the [employer]?: No  Education: Education Is Patient  Currently Attending School?: No   CCA Family/Childhood History  Family and Relationship History: Family history Marital status: Single Does patient have children?: No  Childhood History:  Childhood History By whom was/is the patient raised?: Mother, Father Additional childhood history information: Lived with mother thru 8th grade, then he and twind moved in with father here for HS Description of patient's relationship with caregiver when they were a child: Good Did patient suffer any verbal/emotional/physical/sexual abuse as a child?: No Did patient suffer from severe childhood neglect?: No Has patient ever been sexually abused/assaulted/raped as an adolescent or adult?: No Was the patient ever a victim of a crime or a disaster?: No Witnessed domestic violence?: No Has patient been affected by domestic violence as an adult?: No  Child/Adolescent Assessment:     CCA Substance Use  Alcohol/Drug Use: Alcohol / Drug Use Pain Medications: see MAR Prescriptions: see MAR Over the Counter: see MAR History of alcohol / drug use?: No history of alcohol / drug abuse                         ASAM's:  Six Dimensions of Multidimensional Assessment  Dimension 1:  Acute Intoxication and/or Withdrawal Potential:      Dimension 2:  Biomedical Conditions and Complications:      Dimension 3:  Emotional, Behavioral, or Cognitive Conditions and Complications:     Dimension 4:  Readiness to Change:     Dimension 5:  Relapse, Continued use, or Continued Problem Potential:     Dimension 6:  Recovery/Living Environment:     ASAM Severity Score:    ASAM Recommended Level of Treatment:     Substance use Disorder (SUD)    Recommendations for Services/Supports/Treatments:    DSM5 Diagnoses: Patient Active Problem List   Diagnosis Date Noted  . 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 History of open reduction and internal fixation  (ORIF) procedure 04/15/2018  . Auditory hallucination   . S/P appendectomy 07/16/2017  . 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 Substance-induced psychotic disorder with delusions (HCC) 08/30/2012    Patient Centered Plan: Patient is on the following Treatment Plan(s):    Referrals to Alternative Service(s): Referred to Alternative Service(s):   Place:   Date:   Time:    Referred to Alternative Service(s):   Place:   Date:   Time:    Referred to Alternative Service(s):   Place:   Date:   Time:    Referred to Alternative Service(s):   Place:   Date:   Time:     MSE:  Pt presented as alert and oriented.  He had good eye contact and was cooperative.  Pt was dressed in street clothes, and he appeared appropriately groomed.  Pt's mood and affect were anxious.  Pt's speech was normal in rate, rhythm, and volume.  Thought processes were within normal range, and thought content was logical and goal-oriented.  There was no evidence of delusion.  Pt endorsed  hallucination.  Pt's memory and concentration were fair.  Insight, judgment, and impulse control were fair.  DISPOSITION:  Consulted with Ander Slade, NP, who determined that Pt does not meet inpatient criteria and is psych-cleared.  Pt to be discharged with outpatient appointments.  Dorris Fetch Raelynn Corron

## 2020-03-20 NOTE — ED Provider Notes (Signed)
Behavioral Health Medical Screening Exam  [name] [name] is a 29 y.o. male with history of major depressive disorder with psychosis, auditory hallucinations, and substance-induced psychosis.  Patient presents today as a walk-in to behavioral health urgent care to establish care.  Patient is recently stepping down from PST to CST through [facility] [place].  Patient QP is present and states patient is just here to establish services and coordination of care from [place].  Patient denies suicidal ideations, homicidal ideations, however endorses auditory and visual hallucinations that are chronic in nature.  He does report the voices have improved since initiating medication.  Patient will be psychiatrically cleared at this time with follow-up scheduled please see below in AVS.  Total Time spent with patient: 15 minutes  Psychiatric Specialty Exam  Presentation  General Appearance:Appropriate for Environment;Casual;Fairly Groomed  Eye Contact:Good  Speech:Clear and Coherent;Normal Rate  Speech Volume:Normal  Handedness:Right   Mood and Affect  Mood:Anxious  Affect:Appropriate;Congruent   Thought Process  Thought Processes:Coherent;Goal Directed  Descriptions of Associations:Intact  Orientation:Full (Time, Place and Person)  Thought Content:Logical  Hallucinations:No data recorded Ideas of Reference:None  Suicidal Thoughts:No  Homicidal Thoughts:No   Sensorium  Memory:Immediate Good;Recent Good;Remote Fair  Judgment:Fair  Insight:Fair   Executive Functions  Concentration:Fair  Attention Span:Fair  Recall:Fair  Fund of Knowledge:Fair  Language:Fair   Psychomotor Activity  Psychomotor Activity:Normal   Assets  Assets:Communication Skills;Desire for Improvement;Financial Resources/Insurance;Housing;Resilience;Social Support   Sleep  Sleep:Fair  Number of hours: No data recorded  Physical Exam: Physical Exam ROS Blood pressure 136/79, pulse (!) 108,  temperature (!) 97.3 F (36.3 C), temperature source Temporal, resp. rate 18, height 5\' 4"  (1.626 m), weight 260 lb (117.9 kg), SpO2 100 %. Body mass index is 44.63 kg/m.  Musculoskeletal: Strength & Muscle Tone: within normal limits Gait & Station: normal Patient leans: N/A   Recommendations:  Based on my evaluation the patient does not appear to have an emergency medical condition. Patient will be psychiatrically cleared.  Patient is to return home with support person.  Patient recently stepped down from PST to CST through [facility] and is present to establish care.  Will provide outpatient resources and attempt to schedule an appointment for him therapy and medication management.  However due to the timing and is now 5 PM an we will be unable to schedule an appointment.  [facility], FNP 03/20/2020, 4:57 PM

## 2020-03-21 ENCOUNTER — Telehealth (INDEPENDENT_AMBULATORY_CARE_PROVIDER_SITE_OTHER): Payer: Self-pay | Admitting: Psychiatric/Mental Health

## 2020-03-21 DIAGNOSIS — R440 Auditory hallucinations: Secondary | ICD-10-CM

## 2020-03-21 DIAGNOSIS — F333 Major depressive disorder, recurrent, severe with psychotic symptoms: Secondary | ICD-10-CM

## 2020-03-25 ENCOUNTER — Encounter (HOSPITAL_COMMUNITY): Payer: Self-pay | Admitting: Psychiatric/Mental Health

## 2020-03-25 MED ORDER — ARIPIPRAZOLE 5 MG PO TABS: 5.0000 mg | ORAL_TABLET | Freq: Every day | ORAL | 2 refills | Status: DC

## 2020-03-25 NOTE — Progress Notes (Signed)
Psychiatric Initial Adult Assessment   Patient Identification: [name] [name] MRN:  [id number] Date of Evaluation:  03/25/2020 Referral Source: [name] Chief Complaint:   Visit Diagnosis:    ICD-10-CM   1. Auditory hallucination  R44.0 ARIPiprazole (ABILIFY) 5 MG tablet  2. MDD (major depressive disorder), recurrent, severe, with psychosis (HCC)  F33.3     History of Present Illness: [name] [name] is 29 year-old male seen today for initial psych evaluation.  Patient was referred to outpatient psychiatry by [facility] for medication management.  [name] [name] reports that he has been having AH of a voice narrating and whispering. reports that this happens daily. this has been going on for 2+ years. He reports that he has also bee having VH will see a man standing beside him on occasion. happens nearly everyday this has been going on for 2+ years. He reports that he feels paranoid as if someone is watching or hovering over over him on occasion. happens nearly everyday this has been going on for 2+ years. He reports that he has been sleeping "pretty well" and that is appetite is "ok". He reports no mamajuana use in approximately one month. He reports no nightmares at this time. He denies current AH/VH/SI/HI.  [name] [name] states that he is reluctant to start any medication because he is very concerned about possible side effects.  He admits to not being on his medication in over a year due to this fact.  However, today patient is agreeable to start Abilify 5 mg daily.  He has been instructed to call [name] [name] if side effects become unmanageable.  He has also been educated on what side effects are typical to starting any medications.  Patient is in agreement to start medications and and mom writer of intolerable side effects before discontinuing medication.  At this time patient agrees to follow-up in 8 weeks.  No further concerns at this time Associated Signs/Symptoms: Depression Symptoms:  depressed  mood, anxiety, (Hypo) Manic Symptoms:  Delusions, Hallucinations, Anxiety Symptoms:  Social Anxiety, Psychotic Symptoms:  Delusions, Paranoia, PTSD Symptoms: NA  Past Psychiatric History: Bipolar disorder rule out   Previous Psychotropic Medications: Yes   Substance Abuse History in the last 12 months:  No.  Consequences of Substance Abuse: NA  Past Medical History:  Past Medical History:  Diagnosis Date  . Schizophrenia (HCC)   . Tibial plateau fracture, left     Past Surgical History:  Procedure Laterality Date  . APPENDECTOMY    . LAPAROSCOPIC APPENDECTOMY N/A 07/16/2017   Procedure: APPENDECTOMY LAPAROSCOPIC;  Surgeon: Axel Filler, MD;  Location: [facility] OR;  Service: General;  Laterality: N/A;  . LAPAROTOMY N/A 07/16/2017   Procedure: POSSIBLE OPEN  LAPAROTOMY;  Surgeon: Axel Filler, MD;  Location[facility] OR;  Service: General;  Laterality: N/A;  . NO PAST SURGERIES    . ORIF TIBIA PLATEAU Left 04/15/2018   Procedure: OPEN REDUCTION INTERNAL FIXATION (ORIF) LEFT BICONDYLAR TIBIAL PLATEAU;  Surgeon: Tarry Kos, MD;  Location: [facility] OR;  Service: Orthopedics;  Laterality: Left;    Family Psychiatric History: unknown  Family History:  Family History  Problem Relation Age of Onset  . Other Mother   . Throat cancer Mother   . Other Brother     Social History:   Social History   Socioeconomic History  . Marital status: Single    Spouse name: Not on file  . Number of children: Not on file  . Years of education: Not on file  . Highest education level: Not  on file  Occupational History  . Not on file  Tobacco Use  . Smoking status: Current Some Day Smoker    Types: Cigarettes  . Smokeless tobacco: Never Used  Vaping Use  . Vaping Use: Never used  Substance and Sexual Activity  . Alcohol use: Yes  . Drug use: Not Currently    Types: Marijuana    Comment: denies use of marijuana   . Sexual activity: Not Currently  Other Topics Concern  . Not on file   Social History Narrative  . Not on file   Social Determinants of Health   Financial Resource Strain:   . Difficulty of Paying Living Expenses:   Food Insecurity:   . Worried About Programme researcher, broadcasting/film/video in the Last Year:   . Barista in the Last Year:   Transportation Needs:   . Freight forwarder (Medical):   Marland Kitchen Lack of Transportation (Non-Medical):   Physical Activity:   . Days of Exercise per Week:   . Minutes of Exercise per Session:   Stress:   . Feeling of Stress :   Social Connections:   . Frequency of Communication with Friends and Family:   . Frequency of Social Gatherings with Friends and Family:   . Attends Religious Services:   . Active Member of Clubs or Organizations:   . Attends Banker Meetings:   Marland Kitchen Marital Status:     Additional Social History: unknown  Allergies:  No Known Allergies  Metabolic Disorder Labs: Lab Results  Component Value Date   HGBA1C 5.5 11/01/2019   Lab Results  Component Value Date   PROLACTIN 5.6 01/14/2015   Lab Results  Component Value Date   CHOL 178 11/01/2019   TRIG 126 11/01/2019   HDL 38 (L) 11/01/2019   CHOLHDL 4.7 11/01/2019   LDLCALC 117 (H) 11/01/2019   Lab Results  Component Value Date   TSH 0.883 01/11/2019    Therapeutic Level Labs: No results found for: LITHIUM No results found for: CBMZ No results found for: VALPROATE  Current Medications: Current Outpatient Medications  Medication Sig Dispense Refill  . ARIPiprazole (ABILIFY) 5 MG tablet Take 1 tablet (5 mg total) by mouth daily. 30 tablet 2   No current facility-administered medications for this visit.    Musculoskeletal: Strength & Muscle Tone: within normal limits Gait & Station: normal Patient leans: N/A  Psychiatric Specialty Exam: Review of Systems  There were no vitals taken for this visit.There is no height or weight on file to calculate BMI.  General Appearance: Casual  Eye Contact:  Fair  Speech:  Normal  Rate  Volume:  Normal  Mood:  Anxious  Affect:  Appropriate  Thought Process:  Coherent and Descriptions of Associations: Intact  Orientation:  Full (Time, Place, and Person)  Thought Content:  WDL and Logical  Suicidal Thoughts:  No  Homicidal Thoughts:  No  Memory:  Immediate;   Fair  Judgement:  Good  Insight:  Fair  Psychomotor Activity:  Normal  Concentration:  Attention Span: Fair  Recall:  [employer] of Knowledge:Fair  Language: Fair  Akathisia:  NA  Handed:  Right  AIMS (if indicated):  not done  Assets:  Communication Skills  ADL's:  Intact  Cognition: WNL  Sleep:  Fair   Screenings: AIMS     Admission (Discharged) from OP Visit from 01/10/2019 in BE[facility]AVIORAL HEALTH OBSERVATION UNIT  AIMS Total Score 0    AUDIT  Admission (Discharged) from OP Visit from 01/10/2019 in BE[facility]AL [facility]ALTH OBSERVATION UNIT [facility] ([facility]  Alcohol Use Disorder Identification Test Final Score (AUDIT) 0 1    PHQ2-9     ED from 03/20/2020 in [facility] Office Visit from 10/26/2019 in [facility]  PHQ-2 Total Score 2 0  PHQ-9 Total Score 7 0      Assessment and Plan: Patient  is agreeable to start medications as prescribed. Patient agrees to follow up in 4 weeks.       1. Auditory hallucination  R44.0 ARIPiprazole (ABILIFY) 5 MG tablet  2. MDD (major depressive disorder), recurrent, severe, with psychosis (HCC)  F33.3     Jearld Lesch, NP 7/5/20213:43 AM

## 2020-03-26 MED FILL — ARIPiprazole 5 MG TABS: 5 | 30 days supply | Qty: 30 | Fill #0

## 2020-04-05 MED FILL — ARIPiprazole 5 MG TABS: 5 | 30 days supply | Qty: 30 | Fill #0

## 2020-04-18 ENCOUNTER — Telehealth (HOSPITAL_COMMUNITY): Payer: Self-pay | Admitting: Psychiatric/Mental Health

## 2020-04-18 ENCOUNTER — Other Ambulatory Visit: Payer: Self-pay

## 2020-07-20 IMAGING — RF DG KNEE 3 VIEWS*L*
1 series · 2 of 2 positions shown · non-contrast
Comparison: 04/11/2018

CLINICAL DATA: ORIF of left tibial fracture

EXAM:
LEFT KNEE - 3 VIEW; DG C-ARM 61-120 MIN

[Series 1: run · 2 of 2 slices shown]
[im 1/2]
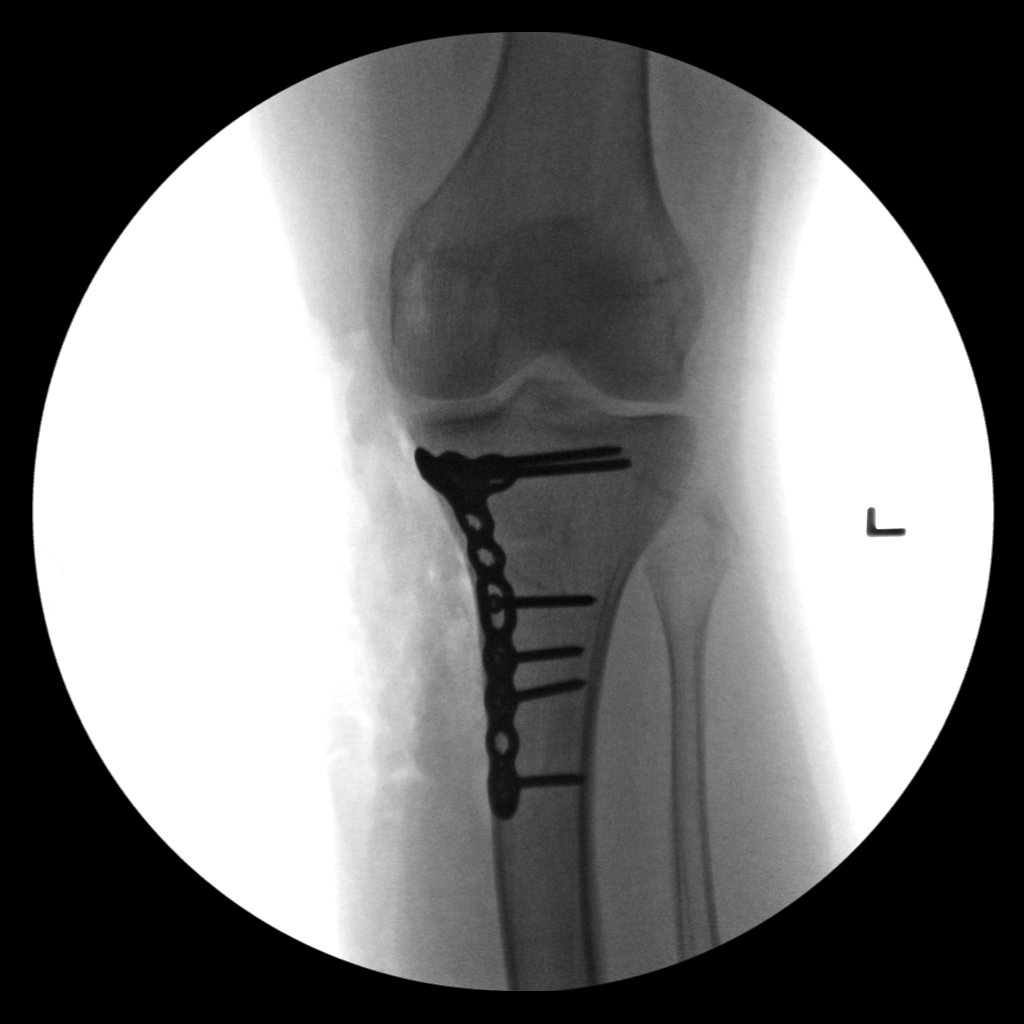
[im 2/2]
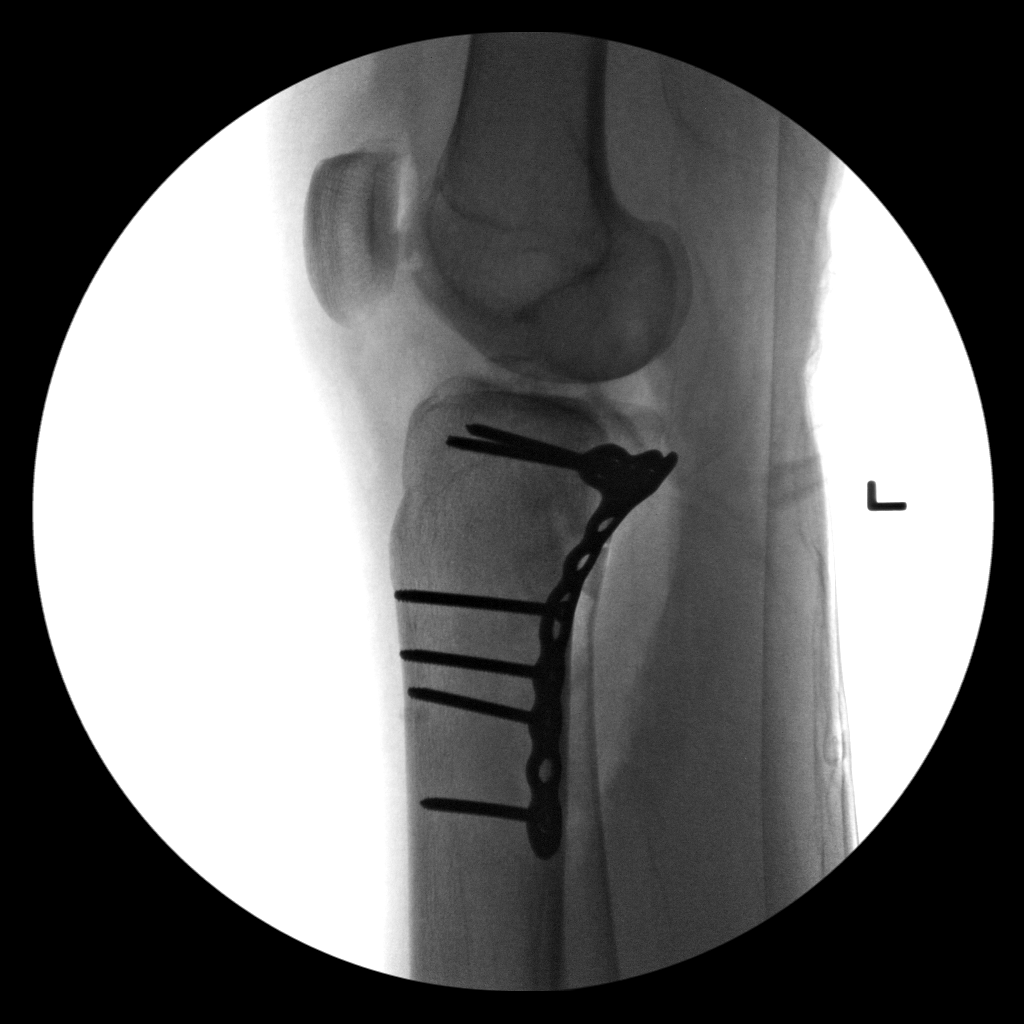

[2 of 2 positions shown; findings below may reference images not displayed]

FLUOROSCOPY TIME:  Fluoroscopy Time:  Not available

Radiation Exposure Index (if provided by the fluoroscopic device):
Not available

Number of Acquired Spot Images: 2
FINDINGS: Fixation sideplate is noted along the medial aspect of the proximal
tibia. Multiple fixation screws are noted. Fracture fragments are in
near anatomic alignment.
IMPRESSION: Status post ORIF of proximal left tibial fracture

## 2021-01-07 ENCOUNTER — Other Ambulatory Visit: Payer: Self-pay

## 2021-01-07 ENCOUNTER — Emergency Department (HOSPITAL_COMMUNITY)
Admission: EM | Admit: 2021-01-07 | Discharge: 2021-01-07 | Disposition: A | Discharge status: Other Acute Inpatient Hospital | Payer: Medicaid Other | Attending: Emergency Medicine | Admitting: Emergency Medicine

## 2021-01-07 ENCOUNTER — Encounter (HOSPITAL_COMMUNITY): Payer: Self-pay | Admitting: Emergency Medicine

## 2021-01-07 ENCOUNTER — Inpatient Hospital Stay (HOSPITAL_COMMUNITY)
Admission: AD | Admit: 2021-01-07 | Discharge: 2021-01-17 | DRG: 885 | Disposition: A | Discharge status: Home / Self Care | Payer: Federal, State, Local not specified - Other | Source: Intra-hospital | Attending: Psychiatry | Admitting: Psychiatry

## 2021-01-07 DIAGNOSIS — Z1339 Encounter for screening examination for other mental health and behavioral disorders: Secondary | ICD-10-CM | POA: Insufficient documentation

## 2021-01-07 DIAGNOSIS — R45851 Suicidal ideations: Secondary | ICD-10-CM | POA: Diagnosis present

## 2021-01-07 DIAGNOSIS — F19950 Other psychoactive substance use, unspecified with psychoactive substance-induced psychotic disorder with delusions: Secondary | ICD-10-CM | POA: Diagnosis present

## 2021-01-07 DIAGNOSIS — F203 Undifferentiated schizophrenia: Secondary | ICD-10-CM | POA: Diagnosis present

## 2021-01-07 DIAGNOSIS — F333 Major depressive disorder, recurrent, severe with psychotic symptoms: Secondary | ICD-10-CM | POA: Diagnosis present

## 2021-01-07 DIAGNOSIS — I10 Essential (primary) hypertension: Secondary | ICD-10-CM | POA: Diagnosis present

## 2021-01-07 DIAGNOSIS — E291 Testicular hypofunction: Secondary | ICD-10-CM | POA: Diagnosis present

## 2021-01-07 DIAGNOSIS — F17210 Nicotine dependence, cigarettes, uncomplicated: Secondary | ICD-10-CM | POA: Diagnosis present

## 2021-01-07 DIAGNOSIS — Z20822 Contact with and (suspected) exposure to covid-19: Secondary | ICD-10-CM | POA: Insufficient documentation

## 2021-01-07 DIAGNOSIS — R000 Tachycardia, unspecified: Secondary | ICD-10-CM | POA: Diagnosis not present

## 2021-01-07 DIAGNOSIS — F209 Schizophrenia, unspecified: Secondary | ICD-10-CM | POA: Diagnosis present

## 2021-01-07 DIAGNOSIS — F12150 Cannabis abuse with psychotic disorder with delusions: Secondary | ICD-10-CM | POA: Diagnosis present

## 2021-01-07 DIAGNOSIS — F1290 Cannabis use, unspecified, uncomplicated: Secondary | ICD-10-CM | POA: Diagnosis present

## 2021-01-07 DIAGNOSIS — R45850 Homicidal ideations: Secondary | ICD-10-CM | POA: Diagnosis present

## 2021-01-07 DIAGNOSIS — Z5900 Homelessness unspecified: Secondary | ICD-10-CM

## 2021-01-07 DIAGNOSIS — G4700 Insomnia, unspecified: Secondary | ICD-10-CM | POA: Diagnosis present

## 2021-01-07 DIAGNOSIS — F419 Anxiety disorder, unspecified: Secondary | ICD-10-CM | POA: Diagnosis present

## 2021-01-07 DIAGNOSIS — F19259 Other psychoactive substance dependence with psychoactive substance-induced psychotic disorder, unspecified: Secondary | ICD-10-CM | POA: Insufficient documentation

## 2021-01-07 LAB — RESP PANEL BY RT-PCR (FLU A&B, COVID) ARPGX2
Influenza A by PCR: NEGATIVE
Influenza B by PCR: NEGATIVE
SARS Coronavirus 2 by RT PCR: NEGATIVE

## 2021-01-07 LAB — RAPID URINE DRUG SCREEN, HOSP PERFORMED
Amphetamines: NOT DETECTED
Barbiturates: NOT DETECTED
Benzodiazepines: NOT DETECTED
Cocaine: NOT DETECTED
Opiates: NOT DETECTED
Tetrahydrocannabinol: POSITIVE — AB

## 2021-01-07 LAB — CBC
HCT: 40.6 % (ref 39.0–52.0)
Hemoglobin: 13.7 g/dL (ref 13.0–17.0)
MCH: 28.7 pg (ref 26.0–34.0)
MCHC: 33.7 g/dL (ref 30.0–36.0)
MCV: 84.9 fL (ref 80.0–100.0)
Platelets: 387 10*3/uL (ref 150–400)
RBC: 4.78 MIL/uL (ref 4.22–5.81)
RDW: 12.7 % (ref 11.5–15.5)
WBC: 13.7 10*3/uL — ABNORMAL HIGH (ref 4.0–10.5)
nRBC: 0 % (ref 0.0–0.2)

## 2021-01-07 LAB — COMPREHENSIVE METABOLIC PANEL
ALT: 35 U/L (ref 0–44)
AST: 27 U/L (ref 15–41)
Albumin: 4.2 g/dL (ref 3.5–5.0)
Alkaline Phosphatase: 155 U/L — ABNORMAL HIGH (ref 38–126)
Anion gap: 10 (ref 5–15)
BUN: 7 mg/dL (ref 6–20)
CO2: 24 mmol/L (ref 22–32)
Calcium: 9.3 mg/dL (ref 8.9–10.3)
Chloride: 105 mmol/L (ref 98–111)
Creatinine, Ser: 0.73 mg/dL (ref 0.61–1.24)
GFR, Estimated: >60 mL/min (ref >60)
Glucose, Bld: 95 mg/dL (ref 70–99)
Potassium: 3.8 mmol/L (ref 3.5–5.1)
Sodium: 139 mmol/L (ref 135–145)
Total Bilirubin: 0.8 mg/dL (ref 0.3–1.2)
Total Protein: 7.3 g/dL (ref 6.5–8.1)

## 2021-01-07 LAB — ETHANOL: Alcohol, Ethyl (B): <10 mg/dL (ref <10)

## 2021-01-07 LAB — ACETAMINOPHEN LEVEL: Acetaminophen (Tylenol), Serum: <10 ug/mL — ABNORMAL LOW (ref 10–30)

## 2021-01-07 LAB — SALICYLATE LEVEL: Salicylate Lvl: <7 mg/dL — ABNORMAL LOW (ref 7.0–30.0)

## 2021-01-07 MED ORDER — ONDANSETRON HCL 4 MG PO TABS: 4.0000 mg | ORAL_TABLET | Freq: Three times a day (TID) | ORAL | Status: DC | PRN

## 2021-01-07 MED ORDER — HYDROXYZINE HCL 25 MG PO TABS: 25.0000 mg | ORAL_TABLET | Freq: Three times a day (TID) | ORAL | Status: DC | PRN

## 2021-01-07 MED ORDER — ACETAMINOPHEN 325 MG PO TABS
650.0000 mg | ORAL_TABLET | Freq: Four times a day (QID) | ORAL | Status: DC | PRN
  Administered 2021-01-08 – 2021-01-15 (×5): 650 mg via ORAL
  Filled 2021-01-07 (×5): qty 2

## 2021-01-07 MED ORDER — ALUM & MAG HYDROXIDE-SIMETH 200-200-20 MG/5ML PO SUSP: 30.0000 mL | Freq: Four times a day (QID) | ORAL | Status: DC | PRN

## 2021-01-07 MED ORDER — HYDROXYZINE HCL 25 MG PO TABS
25.0000 mg | ORAL_TABLET | Freq: Three times a day (TID) | ORAL | Status: DC | PRN
  Administered 2021-01-07 – 2021-01-10 (×5): 25 mg via ORAL
  Filled 2021-01-07 (×6): qty 1

## 2021-01-07 MED ORDER — LORAZEPAM 1 MG PO TABS: 1.0000 mg | ORAL_TABLET | ORAL | Status: DC | PRN

## 2021-01-07 MED ORDER — ACETAMINOPHEN 325 MG PO TABS: 650.0000 mg | ORAL_TABLET | ORAL | Status: DC | PRN

## 2021-01-07 MED ORDER — OLANZAPINE 5 MG PO TBDP
10.0000 mg | ORAL_TABLET | Freq: Three times a day (TID) | ORAL | Status: DC | PRN
Start: 2021-01-07 — End: 2021-01-07

## 2021-01-07 MED ORDER — ALUM & MAG HYDROXIDE-SIMETH 200-200-20 MG/5ML PO SUSP: 30.0000 mL | ORAL | Status: DC | PRN

## 2021-01-07 MED ORDER — OLANZAPINE 10 MG PO TBDP
10.0000 mg | ORAL_TABLET | Freq: Three times a day (TID) | ORAL | Status: DC | PRN
  Administered 2021-01-09: 10 mg via ORAL
  Filled 2021-01-07: qty 1

## 2021-01-07 MED ORDER — ACETAMINOPHEN 325 MG PO TABS: 650.0000 mg | ORAL_TABLET | Freq: Four times a day (QID) | ORAL | Status: DC | PRN

## 2021-01-07 MED ORDER — MAGNESIUM HYDROXIDE 400 MG/5ML PO SUSP: 30.0000 mL | Freq: Every day | ORAL | Status: DC | PRN

## 2021-01-07 MED ORDER — ZIPRASIDONE MESYLATE 20 MG IM SOLR: 20.0000 mg | INTRAMUSCULAR | Status: DC | PRN

## 2021-01-07 MED ORDER — ZIPRASIDONE MESYLATE 20 MG IM SOLR
20.0000 mg | Freq: Once | INTRAMUSCULAR | Status: AC | PRN
  Administered 2021-01-08: 20 mg via INTRAMUSCULAR
  Filled 2021-01-07: qty 20

## 2021-01-07 MED ORDER — ZOLPIDEM TARTRATE 5 MG PO TABS: 5.0000 mg | ORAL_TABLET | Freq: Every evening | ORAL | Status: DC | PRN

## 2021-01-07 MED ORDER — TRAZODONE HCL 50 MG PO TABS
50.0000 mg | ORAL_TABLET | Freq: Every evening | ORAL | Status: DC | PRN
  Administered 2021-01-07 – 2021-01-13 (×4): 50 mg via ORAL
  Filled 2021-01-07 (×6): qty 1

## 2021-01-07 MED ORDER — NICOTINE 21 MG/24HR TD PT24: 21.0000 mg | MEDICATED_PATCH | Freq: Every day | TRANSDERMAL | Status: DC | PRN

## 2021-01-07 MED ORDER — OLANZAPINE 5 MG PO TBDP
10.0000 mg | ORAL_TABLET | Freq: Every day | ORAL | Status: DC
  Administered 2021-01-07: 10 mg via ORAL
  Filled 2021-01-07 (×2): qty 2

## 2021-01-07 MED ORDER — TRAZODONE HCL 50 MG PO TABS
50.0000 mg | ORAL_TABLET | Freq: Every evening | ORAL | Status: DC | PRN
Start: 2021-01-07 — End: 2021-01-07

## 2021-01-07 MED ORDER — LORAZEPAM 1 MG PO TABS: 1.0000 mg | ORAL_TABLET | Freq: Four times a day (QID) | ORAL | Status: DC | PRN

## 2021-01-07 MED ORDER — MAGNESIUM HYDROXIDE 400 MG/5ML PO SUSP
30.0000 mL | Freq: Every day | ORAL | Status: DC | PRN
  Administered 2021-01-09 – 2021-01-15 (×3): 30 mL via ORAL
  Filled 2021-01-07 (×3): qty 30

## 2021-01-07 NOTE — ED Notes (Signed)
Pt sleeping in bed. NAD noted. Equal RR noted.

## 2021-01-07 NOTE — Progress Notes (Signed)
Pt accepted to [facility] room 403-1   Patient meets inpatient criteria per Assunta Found, NP.   Dr. Jola Babinski is the attending provider.    Call report to [phone]    [facility] ED notified.     Pt scheduled  to arrive at [facility] at 2000 PM.  Signed:  Corky Crafts, MSW, LCSWA, LCASA 01/07/2021 1:02 PM
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
## 2021-01-07 NOTE — ED Triage Notes (Signed)
Patient reports suicidal ideation plans to jump over a bridge , auditory hallucinations and paranoid .

## 2021-01-07 NOTE — BH Assessment (Signed)
TTS Disposition: Per Assunta Found, NP, patient meets criteria for inpatient psychiatric treatment. CSW to seek appropriate placement.
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
## 2021-01-08 ENCOUNTER — Encounter (HOSPITAL_COMMUNITY): Payer: Self-pay | Admitting: Registered Nurse

## 2021-01-08 DIAGNOSIS — F1290 Cannabis use, unspecified, uncomplicated: Secondary | ICD-10-CM | POA: Diagnosis present

## 2021-01-08 DIAGNOSIS — F203 Undifferentiated schizophrenia: Principal | ICD-10-CM

## 2021-01-08 LAB — LIPID PANEL
Cholesterol: 161 mg/dL (ref 0–200)
HDL: 33 mg/dL — ABNORMAL LOW (ref >40)
LDL Cholesterol: 109 mg/dL — ABNORMAL HIGH (ref 0–99)
Total CHOL/HDL Ratio: 4.9 RATIO
Triglycerides: 95 mg/dL (ref <150)
VLDL: 19 mg/dL (ref 0–40)

## 2021-01-08 LAB — TSH: TSH: 0.496 u[IU]/mL (ref 0.350–4.500)

## 2021-01-08 LAB — HEMOGLOBIN A1C
Hgb A1c MFr Bld: 5.6 % (ref 4.8–5.6)
Mean Plasma Glucose: 114.02 mg/dL
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
## 2021-01-08 NOTE — Progress Notes (Signed)
Recreation Therapy Notes  Date: 4.20.22 Time: 0930 Location: 300 Hall Dayroom  Group Topic: Stress Management   Goal Area(s) Addresses:  Patient will actively participate in stress management techniques presented during session.  Patient will successfully identify benefit of practicing stress management post d/c.   Behavioral Response: Appropriate  Intervention: Guided exercise with ambient sound and script  Activity :Guided Imagery  LRT read a script that encouraged patients to envision being in their peaceful place.  Patients were to imagine the sights, sounds and smells of the place they are envisioning.    Education:  Stress Management, Discharge Planning.   Education Outcome: Acknowledges education  Clinical Observations/Feedback: Patient attended and participated in group session.   Oberon Hehir, LRT/CTRS         Keysean Savino A 01/08/2021 11:33 AM 

## 2021-01-08 NOTE — Tx Team (Signed)
Initial Treatment Plan 01/08/2021 3:59 AM [name] [name] HQU:[id number]    PATIENT STRESSORS: Marital or family conflict Medication change or noncompliance   PATIENT STRENGTHS: General fund of knowledge Motivation for treatment/growth   PATIENT IDENTIFIED PROBLEMS: Psychosis  Risk for SI  "get self together, get mind right"                 DISCHARGE CRITERIA:  Improved stabilization in mood, thinking, and/or behavior Verbal commitment to aftercare and medication compliance  PRELIMINARY DISCHARGE PLAN: Attend aftercare/continuing care group Attend PHP/IOP Outpatient therapy  PATIENT/FAMILY INVOLVEMENT: This treatment plan has been presented to and reviewed with the patient, [name] [name].  The patient and family have been given the opportunity to ask questions and make suggestions.  Delos Haring, RN 01/08/2021, 3:59 AM

## 2021-01-08 NOTE — Plan of Care (Signed)
Nurse discussed anxiety, depression and coping skills with patient.  

## 2021-01-08 NOTE — BHH Group Notes (Signed)
The focus of this group is to help patients establish daily goals to achieve during treatment and discuss how the patient can incorporate goal setting into their daily lives to aide in recovery.  Pt attended and participated in group 
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
## 2021-01-08 NOTE — Progress Notes (Signed)
   01/08/21 0500  Sleep  Number of Hours 5.25

## 2021-01-08 NOTE — BHH Group Notes (Signed)
Type of Therapy and Topic:  Group Therapy - Healthy vs Unhealthy Coping Skills  Participation Level:  Active   Description of Group The focus of this group was to determine what unhealthy coping techniques typically are used by group members and what healthy coping techniques would be helpful in coping with various problems. Patients were guided in becoming aware of the differences between healthy and unhealthy coping techniques. Patients were asked to identify 2-3 healthy coping skills they would like to learn to use more effectively.  Therapeutic Goals 1. Patients learned that coping is what human beings do all day long to deal with various situations in their lives 2. Patients defined and discussed healthy vs unhealthy coping techniques 3. Patients identified their preferred coping techniques and identified whether these were healthy or unhealthy 4. Patients determined 2-3 healthy coping skills they would like to become more familiar with and use more often. 5. Patients provided support and ideas to each other   Summary of Patient Progress:  During group Jakaiden spent time engaging with his peers and discussing various coping skills that he could use outside of the hospital.  One coping skills that was discussed amongst the group was spending time with friends and loved ones.  Another major theme of the group was various physical activities such as playing basketball and picking flowers. Voshon was appropriate and participated in all group activities.    Therapeutic Modalities Cognitive Behavioral Therapy Motivational Interviewing
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
## 2021-01-08 NOTE — [facility] Suicide Risk Assessment (Signed)
[facility] Admission Suicide Risk Assessment   Nursing information obtained from:  Patient Demographic factors:  Living alone Current Mental Status:  Suicidal ideation indicated by patient,Plan to harm others Loss Factors:  Financial problems / change in socioeconomic status Historical Factors:  Impulsivity Risk Reduction Factors:  Positive social support  Total Time spent with patient: 1 hour Principal Problem: Substance-induced psychotic disorder with delusions (HCC) Diagnosis:  Principal Problem:   Substance-induced psychotic disorder with delusions (HCC) Active Problems:   Hypogonadism male   MDD (major depressive disorder), recurrent, severe, with psychosis (HCC)   Marijuana use  Subjective Data: "I need to get back on medicine, and learn to get my anger under control."  History of Present Illness: This is an admission assessment for this 30 year old AA male with previous hx of mental illness. He is being admitted to the [facility] from [facility] with complaints of increased stress with hallucinations, suicidal & homicidal ideations. Planned to jump off a bridge. States he fought with his brother and the cops are after him which have been the triggers. During this evaluation, [name] [name] reports,  "I walked to the [facility] on Sunday night. I was feeling like I'm gonna hurt myself or other people. I had a fight with my brother over an X-box. Prior to that, I was having a problem with a girl, she kicked me out of the apartment. After I had the fight with my brother, it worsened my suicidal thoughts. That was why I walked to the hospital. Right now I don't have any where to go or live. I smoke weed everyday to cope with my problems. Weed helps me. I smoke it everyday. But it is illegal here in [place], so I want to relocate to [facility] where weed is legal, that way, I won't have to hide to buy it or smoke it. I feel a lot better today. I'm comfortable here. Right now, my depression is  #2, anxiety #6. I'm no longer having suicidal/homicidal thoughts".     Associated Signs/Symptoms:  Depression Symptoms:  hopelessness, anxiety,  Duration of Depression Symptoms: Greater than two weeks  (Hypo) Manic Symptoms:  Denies  Anxiety Symptoms:  Excessive Worry,  Psychotic Symptoms:  Hallucinations: Auditory  PTSD Symptoms: NA   Past Psychiatric History: Hx psychosis, substance use (marijuana).  Is the patient at risk to self? No.  Has the patient been a risk to self in the past 6 months? Yes.    Has the patient been a risk to self within the distant past? Yes.    Is the patient a risk to others? No.  Has the patient been a risk to others in the past 6 months? No.  Has the patient been a risk to others within the distant past? No.   Prior Inpatient Therapy: Yes Prior Outpatient Therapy: No.    Continued Clinical Symptoms:  Alcohol Use Disorder Identification Test Final Score (AUDIT): 0 The "Alcohol Use Disorders Identification Test", Guidelines for Use in Primary Care, Second Edition.  World Science writer [facility]). Score between 0-7:  no or low risk or alcohol related problems. Score between 8-15:  moderate risk of alcohol related problems. Score between 16-19:  high risk of alcohol related problems. Score 20 or above:  warrants further diagnostic evaluation for alcohol dependence and treatment.   CLINICAL FACTORS:   Severe Anxiety and/or Agitation Depression:   Comorbid alcohol abuse/dependence Hopelessness Impulsivity Insomnia Alcohol/Substance Abuse/Dependencies Currently Psychotic   Musculoskeletal: Strength & Muscle Tone: within  normal limits Gait & Station: normal Patient leans: N/A  Psychiatric Specialty Exam:  Presentation  General Appearance: Appropriate for Environment; Casual; Fairly Groomed  Eye Contact:Good  Speech:Clear and Coherent; Normal Rate  Speech Volume:Normal  Handedness:Right   Mood and Affect   Mood:Anxious  Affect:Appropriate; Congruent  Thought Process  Thought Processes:Coherent; Goal Directed  Duration of Psychotic Symptoms: Greater than six months  Past Diagnosis of Schizophrenia or Psychoactive disorder: Yes  Descriptions of Associations:Intact  Orientation:Full (Time, Place and Person)  Thought Content:Logical  Hallucinations:No data recorded Ideas of Reference:None  Suicidal Thoughts:No data recorded Homicidal Thoughts:No data recorded  Sensorium  Memory:Immediate Good; Recent Good; Remote Fair  Judgment:Fair  Insight:Fair   Executive Functions  Concentration:Fair  Attention Span:Fair  Recall:Fair  Fund of Knowledge:Fair  Language:Fair  Psychomotor Activity  Psychomotor Activity:No data recorded  Assets  Assets:Communication Skills; Desire for Improvement; Financial Resources/Insurance; Housing; Resilience; Social Support  Sleep  Sleep: New admit  Physical Exam: Physical Exam Vitals and nursing note reviewed.  HENT:     Head: Normocephalic.     Nose: Nose normal.     Mouth/Throat:     Pharynx: Oropharynx is clear.  Eyes:     Pupils: Pupils are equal, round, and reactive to light.  Cardiovascular:     Rate and Rhythm: Normal rate.  Pulmonary:     Effort: Pulmonary effort is normal.  Genitourinary:    Comments: Deferred Musculoskeletal:        General: Normal range of motion.     Cervical back: Normal range of motion.  Skin:    General: Skin is warm and dry.  Neurological:     General: No focal deficit present.     Mental Status: He is alert and oriented to person, place, and time. Mental status is at baseline.    Review of Systems  Constitutional: Negative.   Eyes: Negative.   Respiratory: Negative.   Cardiovascular: Negative.   Gastrointestinal: Negative.   Genitourinary: Negative.   Musculoskeletal: Negative.   Skin: Negative.   Neurological: Negative for dizziness, tingling, tremors,  sensory change, speech change, focal weakness, seizures, loss of consciousness, weakness and headaches.  Endo/Heme/Allergies: Negative for environmental allergies and polydipsia. Does not bruise/bleed easily.       Allergies: NKDA  Psychiatric/Behavioral: Positive for depression (Reported mild depression due to homelessness.) and substance abuse (Hx. UDS (+) for THC). Negative for hallucinations and suicidal ideas. The patient is not nervous/anxious and does not have insomnia.    Psychiatric Specialty Exam:  Presentation  General Appearance: Appropriate for Environment; Casual; Fairly Groomed  Eye Contact:Good  Speech:Clear and Coherent; Normal Rate  Speech Volume:Normal  Handedness:Right   Mood and Affect  Mood:Anxious  Affect:Appropriate; Congruent   Thought Process  Thought Processes:Coherent; Goal Directed  Descriptions of Associations:Intact  Orientation:Full (Time, Place and Person)  Thought Content:Logical  History of Schizophrenia/Schizoaffective disorder:Yes  Duration of Psychotic Symptoms:Greater than six months  Hallucinations:No data recorded Ideas of Reference:None  Suicidal Thoughts:No data recorded Homicidal Thoughts:No data recorded  Sensorium  Memory:Immediate Good; Recent Good; Remote Fair  Judgment:Fair  Insight:Fair   Executive Functions  Concentration:Fair  Attention Span:Fair  Recall:Fair  Fund of Knowledge:Fair  Language:Fair   Psychomotor Activity  Psychomotor Activity:No data recorded  Assets  Assets:Communication Skills; Desire for Improvement; Financial Resources/Insurance; Housing; Resilience; Social Support   Sleep  Sleep:No data recorded  Blood pressure 116/78, pulse 75, temperature 98.5 F (36.9 C), temperature source Oral, resp. rate 16, height 5\' 6"  (1.676 m), weight 101.2  kg, SpO2 100 %. Body mass index is 35.99 kg/m.   COGNITIVE FEATURES THAT CONTRIBUTE TO RISK:  Closed-mindedness, Polarized thinking  and Thought constriction (tunnel vision)    SUICIDE RISK:   Moderate:  Frequent suicidal ideation with limited intensity, and duration, some specificity in terms of plans, no associated intent, good self-control, limited dysphoria/symptomatology, some risk factors present, and identifiable protective factors, including available and accessible social support.  PLAN OF CARE:  Treatment Plan Summary: Daily contact with patient to assess and evaluate symptoms and progress in treatment and Medication management.  Treatment Plan/Recommendations: 1. Admit for crisis management and stabilization, estimated length of stay 3-5 days.  2. Medication management to reduce current symptoms to base line and improve the patient's overall level of functioning: See [facility] for plan of care. 3. Treat health problems as indicated.  4. Develop treatment plan to decrease risk of relapse upon discharge and the need for readmission.  5. Psycho-social education regarding relapse prevention and self care.  6. Health care follow up as needed for medical problems.  7. Review, reconcile, and reinstate any pertinent home medications for other health issues where appropriate. 8. Call for consults with hospitalist for any additional specialty patient care services as needed.  Observation Level/Precautions:  15 minute checks  Laboratory:  Per ED  Psychotherapy: Group sessions   Medications: See [facility]    Consultations: As needed   Discharge Concerns: Safety, mood stability, housing   Estimated LOS: 2-4 days  Other: Admit to the 400-hall   Physician Treatment Plan for Primary Diagnosis: <principal problem not specified> Long Term Goal(s): Improvement in symptoms so as ready for discharge  Short Term Goals: Ability to identify changes in lifestyle to reduce recurrence of condition will improve, Ability to verbalize feelings will improve and Ability to disclose and discuss suicidal ideas  Physician Treatment Plan for  Secondary Diagnosis: Active Problems:   Schizophrenia (HCC)  Long Term Goal(s): Improvement in symptoms so as ready for discharge  Short Term Goals: Ability to identify and develop effective coping behaviors will improve, Compliance with prescribed medications will improve and Ability to identify triggers associated with substance abuse/mental health issues will improve     I certify that inpatient services furnished can reasonably be expected to improve the patient's condition.   Mariel Craft, MD 01/08/2021, 8:07 PM

## 2021-01-08 NOTE — Progress Notes (Signed)
At the beginning of the shift, pt was at the nursing station yelling at staff. Pt loud and delusional. Per Dr Jola Babinski pt given 20 mg Geodon IM and pt moved to 500 hall. Pt continued to pace the hall at times , pt complained about the voices in his head.

## 2021-01-08 NOTE — Tx Team (Signed)
Interdisciplinary Treatment and Diagnostic Plan Update  01/08/2021 Time of Session: 9:25am  [name] [name] MRN: [id number]  Principal Diagnosis: <principal problem not specified>  Secondary Diagnoses: Active Problems:   Schizophrenia ([place])   Current Medications:  Current Facility-Administered Medications  Medication Dose Route Frequency Provider Last Rate Last Admin  . acetaminophen (TYLENOL) tablet 650 mg  650 mg Oral Q6H PRN White, Patrice L, NP   650 mg at 01/08/21 0818  . alum & mag hydroxide-simeth (MAALOX/MYLANTA) 200-200-20 MG/5ML suspension 30 mL  30 mL Oral Q4H PRN White, Patrice L, NP      . hydrOXYzine (ATARAX/VISTARIL) tablet 25 mg  25 mg Oral TID PRN White, Patrice L, NP   25 mg at 01/08/21 0818  . OLANZapine zydis (ZYPREXA) disintegrating tablet 10 mg  10 mg Oral Q8H PRN White, Patrice L, NP       And  . LORazepam (ATIVAN) tablet 1 mg  1 mg Oral Q6H PRN White, Patrice L, NP       And  . ziprasidone (GEODON) injection 20 mg  20 mg Intramuscular Once PRN White, Patrice L, NP      . magnesium hydroxide (MILK OF MAGNESIA) suspension 30 mL  30 mL Oral Daily PRN White, Patrice L, NP      . traZODone (DESYREL) tablet 50 mg  50 mg Oral QHS PRN White, Patrice L, NP   50 mg at 01/07/21 2234   PTA Medications: No medications prior to admission.    Patient Stressors: Marital or family conflict Medication change or noncompliance  Patient Strengths: Technical sales engineer for treatment/growth  Treatment Modalities: Medication Management, Group therapy, Case management,  1 to 1 session with clinician, Psychoeducation, Recreational therapy.   Physician Treatment Plan for Primary Diagnosis: <principal problem not specified> Long Term Goal(s):     Short Term Goals:    Medication Management: Evaluate patient's response, side effects, and tolerance of medication regimen.  Therapeutic Interventions: 1 to 1 sessions, Unit Group sessions and Medication  administration.  Evaluation of Outcomes: Not Met  Physician Treatment Plan for Secondary Diagnosis: Active Problems:   Schizophrenia ([place])  Long Term Goal(s):     Short Term Goals:       Medication Management: Evaluate patient's response, side effects, and tolerance of medication regimen.  Therapeutic Interventions: 1 to 1 sessions, Unit Group sessions and Medication administration.  Evaluation of Outcomes: Not Met   RN Treatment Plan for Primary Diagnosis: <principal problem not specified> Long Term Goal(s): Knowledge of disease and therapeutic regimen to maintain health will improve  Short Term Goals: Ability to remain free from injury will improve, Ability to demonstrate self-control, Ability to participate in decision making will improve, Ability to verbalize feelings will improve, Ability to disclose and discuss suicidal ideas and Ability to identify and develop effective coping behaviors will improve  Medication Management: RN will administer medications as ordered by provider, will assess and evaluate patient's response and provide education to patient for prescribed medication. RN will report any adverse and/or side effects to prescribing provider.  Therapeutic Interventions: 1 on 1 counseling sessions, Psychoeducation, Medication administration, Evaluate responses to treatment, Monitor vital signs and CBGs as ordered, Perform/monitor CIWA, COWS, AIMS and Fall Risk screenings as ordered, Perform wound care treatments as ordered.  Evaluation of Outcomes: Not Met   LCSW Treatment Plan for Primary Diagnosis: <principal problem not specified> Long Term Goal(s): Safe transition to appropriate next level of care at discharge, Engage patient in therapeutic group addressing interpersonal  concerns.  Short Term Goals: Engage patient in aftercare planning with referrals and resources, Increase social support, Increase emotional regulation, Facilitate acceptance of mental health  diagnosis and concerns, Identify triggers associated with mental health/substance abuse issues and Increase skills for wellness and recovery  Therapeutic Interventions: Assess for all discharge needs, 1 to 1 time with Social worker, Explore available resources and support systems, Assess for adequacy in community support network, Educate family and significant other(s) on suicide prevention, Complete Psychosocial Assessment, Interpersonal group therapy.  Evaluation of Outcomes: Not Met   Progress in Treatment: Attending groups: Yes. Participating in groups: Yes. Taking medication as prescribed: Yes. Toleration medication: Yes. Family/Significant other contact made: No, will contact:  If consents are provided  Patient understands diagnosis: No. Discussing patient identified problems/goals with staff: Yes. Medical problems stabilized or resolved: Yes. Denies suicidal/homicidal ideation: Yes. Issues/concerns per patient self-inventory: No.   New problem(s) identified: No, Describe:  None   New Short Term/Long Term Goal(s): medication stabilization, elimination of SI thoughts, development of comprehensive mental wellness plan.   Patient Goals:  "To make my mental health better"  Discharge Plan or Barriers: Patient recently admitted. CSW will continue to follow and assess for appropriate referrals and possible discharge planning.   Reason for Continuation of Hospitalization: Anxiety Depression Hallucinations Medication stabilization Suicidal ideation  Estimated Length of Stay: 3 to 5 days   Attendees: Patient: [name] [name] 01/08/2021   Physician: [name], MD 01/08/2021   Nursing:  01/08/2021   RN Care Manager: 01/08/2021   Social Worker: Verdis Frederickson, [place] 01/08/2021   Recreational Therapist:  01/08/2021   Other:  01/08/2021   Other:  01/08/2021   Other: 01/08/2021     Scribe for Treatment Team: Darleen Crocker, [place] 01/08/2021 11:16 AM

## 2021-01-08 NOTE — Progress Notes (Signed)
D:  Patient's self inventory sheet, patient sleeps good, sleep medication is helpful.  Good appetite, high energy level, good concentration.  Rated depression 3, hopeless 1, anxiety 2.  Denied withdrawals, checked agitation, irritability.  Denied SI.  Denied physical problems.  Denied physical pain.  Goal is keep a clear mind.  Plans to use little walks and try to distract himself.  No discharge plans. A:  Medications administered per MD orders.  Emotional support and encouragement given patient. R:  Denied SI and HI, contracts for safety.  Denied A/V hallucinations.   Safety maintained with 15 minute checks.

## 2021-01-08 NOTE — Progress Notes (Signed)
Adult Psychoeducational Group Note  Date:  01/08/2021 Time:  9:33 PM  Group Topic/Focus:  Wrap-Up Group:   The focus of this group is to help patients review their daily goal of treatment and discuss progress on daily workbooks.  Participation Level:  Did Not Attend  Participation Quality:  Did Not Attend  Affect:  Did Not Attend  Cognitive:  Did Not Attend  Insight: None  Engagement in Group:  Did Not Attend  Modes of Intervention:  Did Not Attend  Additional Comments:  Pt did not attend evening wrap up group tonight.  Felipa Furnace 01/08/2021, 9:33 PM

## 2021-01-09 DIAGNOSIS — F203 Undifferentiated schizophrenia: Secondary | ICD-10-CM | POA: Diagnosis not present

## 2021-01-09 MED ORDER — LORAZEPAM 1 MG PO TABS: 1.0000 mg | ORAL_TABLET | ORAL | Status: DC | PRN

## 2021-01-09 MED ORDER — RISPERIDONE 2 MG PO TBDP: 2.0000 mg | ORAL_TABLET | Freq: Three times a day (TID) | ORAL | Status: DC | PRN

## 2021-01-09 MED ORDER — RISPERIDONE 1 MG PO TABS
1.0000 mg | ORAL_TABLET | Freq: Every day | ORAL | Status: DC
  Administered 2021-01-09: 1 mg via ORAL
  Filled 2021-01-09 (×3): qty 1

## 2021-01-09 MED ORDER — ZIPRASIDONE MESYLATE 20 MG IM SOLR
20.0000 mg | Freq: Four times a day (QID) | INTRAMUSCULAR | Status: DC | PRN
  Administered 2021-01-09: 20 mg via INTRAMUSCULAR
  Filled 2021-01-09: qty 20

## 2021-01-09 MED ORDER — OLANZAPINE 10 MG PO TBDP: 10.0000 mg | ORAL_TABLET | Freq: Three times a day (TID) | ORAL | Status: DC | PRN

## 2021-01-09 MED ORDER — ZIPRASIDONE MESYLATE 20 MG IM SOLR: 20.0000 mg | INTRAMUSCULAR | Status: DC | PRN

## 2021-01-09 MED ORDER — OLANZAPINE 10 MG PO TBDP
10.0000 mg | ORAL_TABLET | Freq: Three times a day (TID) | ORAL | Status: DC | PRN
  Administered 2021-01-10 – 2021-01-13 (×5): 10 mg via ORAL
  Filled 2021-01-09 (×5): qty 1

## 2021-01-09 MED ORDER — LORAZEPAM 1 MG PO TABS
1.0000 mg | ORAL_TABLET | Freq: Four times a day (QID) | ORAL | Status: DC | PRN
  Administered 2021-01-10 – 2021-01-16 (×5): 1 mg via ORAL
  Filled 2021-01-09 (×5): qty 1

## 2021-01-09 MED ORDER — RISPERIDONE 1 MG PO TABS
1.0000 mg | ORAL_TABLET | Freq: Every day | ORAL | Status: DC
  Filled 2021-01-09: qty 1
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
## 2021-01-09 NOTE — Progress Notes (Signed)
Pt was moved to the 500 hall due to pt responding to Hallucinations and receiving 20 mg Geodon IM. Pt was at the nursing station on 400 hall yelling , threatening staff. Pt was de escalated by staff and moved to the 500 hall due to less stimulation.     01/08/21 1800  Psych Admission Type (Psych Patients Only)  Admission Status Voluntary  Psychosocial Assessment  Patient Complaints Anxiety;Depression;Hopelessness  Eye Contact Fair  Facial Expression Worried  Affect Anxious;Preoccupied  Speech Slow  Interaction Assertive  Motor Activity Slow  Appearance/Hygiene Body odor  Behavior Characteristics Anxious  Mood Anxious;Depressed  Aggressive Behavior  Effect No apparent injury  Thought Process  Coherency Concrete thinking  Content Blaming others;Phobias  Delusions Paranoid  Perception Hallucinations  Hallucination Auditory  Judgment Impaired  Confusion WDL  Danger to Self  Current suicidal ideation? Denies  Self-Injurious Behavior No self-injurious ideation or behavior indicators observed or expressed   Agreement Not to Harm Self Yes  Description of Agreement verbal contract for safety  Danger to Others  Danger to Others None reported or observed

## 2021-01-09 NOTE — Progress Notes (Signed)
Pt feels like he is still agitated and will escalate without more medication. "The voices are still there and I just have to let it out when it goes up. I feel like I'm trapped or something. That's when I get aggressive." Contacted provider. Received agitation protocol with standing order for Geodon 20 mg IM. Pt agreeable to receiving Geodon tonight.
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
## 2021-01-09 NOTE — BHH Group Notes (Signed)
Occupational Therapy Group Note Date: 01/09/2021 Group Topic/Focus: Brain Fitness  Group Description: Group encouraged increased social engagement and participation through discussion/activity focused on brain fitness. Patients were provided education on various brain fitness activities/strategies, with explanation provided on the qualifying factors including: one, that is has to be challenging/hard and two, it has to be something that you do not do every day. Patients engaged actively during group session in various brain fitness activities to increase attention, concentration, and problem-solving skills. Discussion followed with a focus on identifying the benefits of brain fitness activities as use for adaptive coping strategies and distraction.    Therapeutic Goal(s): Identify benefit(s) of brain fitness activities as use for adaptive coping and healthy distraction. Identify specific brain fitness activities to engage in as use for adaptive coping and healthy distraction. Participation Level: Pt was initially present in group and introduced himself, however as discussion began, pt abruptly left. Pt did not return to group space.   Plan: Continue to engage patient in OT groups 2 - 3x/week.  01/09/2021  Donne Hazel, MOT, OTR/L

## 2021-01-09 NOTE — BHH Group Notes (Signed)
BHH Group Notes:  (Nursing/MHT/Case Management/Adjunct)  Date:  01/09/2021  Time:  10:41 AM  Type of Therapy:  Group Therapy  Participation Level:  Minimal  Participation Quality:  Resistant  Affect:  Flat and Resistant  Cognitive:  Disorganized  Insight:  Lacking  Engagement in Group:  Limited  Modes of Intervention:  Orientation  Summary of Progress/Problems: His goal is to remain positive today.   [name] [name] [name] 01/09/2021, 10:41 AM

## 2021-01-09 NOTE — Progress Notes (Signed)
Dar Note: Patient presents with irritable affect and mood after dinner.  Patient heard slamming the door to his room multiple times.  He was heard talking to himself and arguing with no one there.  Staff checked on him and asked if he was okay.  Patient responded, "tell this person to stop following me and telling me what to do." Vistaril 25 mg and Zyprexa 10 mg given for agitation/ anxiety with fair effect.  Routine safety checks maintained.  Patient is safe on the unit.

## 2021-01-09 NOTE — Progress Notes (Signed)
Recreation Therapy Notes  Date: 4.21.22 Time: 1000 Location: 500 Hall Dayroom   Group Topic: Self Esteem    Goal Area(s) Addresses:  Patient will appropriately identify what self esteem is.  Patient will create a shield of armor describing themselves.  Patient will successfully identify positive attributes about themselves.    Behavioral Response: Engaged  Intervention / Activity: Self-Esteem Shield. Patient attended a recreation therapy group session focused on self esteem. Patient identified what self esteem is, and why it is important to have high self esteem during group discussion. LRT described to patients that their shield was to reflect the things that make them unique.  Patients could highlight accomplishments, people/pets that are important to them, things they have yet to accomplish, etc.   Patients were provided sheets with the shield printed on them and colored pencils, markers and crayons to complete the activity. LRT also played music for patients as they worked on their shields.  Education: Self esteem, Communication, Positive self-talk, Discharge Planning   Education Outcome: Acknowledges education/Verbalizes understanding of Education/In group clarification offered/Needs further education   Comments: Pt was quiet for the most part but would socialize with peers at times.  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  Pt was appropriate and able to focus during the activity.    Caroll Rancher, LRT/CTRS     Caroll Rancher A 01/09/2021 11:19 AM

## 2021-01-09 NOTE — Progress Notes (Signed)
   01/09/21 0100  Psych Admission Type (Psych Patients Only)  Admission Status Voluntary  Psychosocial Assessment  Patient Complaints Anxiety;Depression;Suspiciousness  Eye Contact Fair  Facial Expression Worried  Affect Anxious;Preoccupied  Speech Slow  Interaction Assertive  Motor Activity Slow  Appearance/Hygiene Body odor  Behavior Characteristics Agressive verbally  Mood Preoccupied;Suspicious  Aggressive Behavior  Effect No apparent injury  Thought Process  Coherency Concrete thinking  Content Blaming others;Phobias  Delusions Paranoid  Perception Hallucinations  Hallucination Auditory  Judgment Impaired  Confusion WDL  Danger to Self  Current suicidal ideation? Denies  Self-Injurious Behavior No self-injurious ideation or behavior indicators observed or expressed   Agreement Not to Harm Self Yes  Description of Agreement verbal contract for safety  Danger to Others  Danger to Others None reported or observed
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
## 2021-01-09 NOTE — Progress Notes (Addendum)
   01/09/21 2024  Psych Admission Type (Psych Patients Only)  Admission Status Voluntary  Psychosocial Assessment  Patient Complaints Agitation;Anxiety  Eye Contact Fair  Facial Expression Worried;Anxious  Affect Anxious;Preoccupied  Speech Loud;Slow;Logical/coherent  Interaction Assertive  Motor Activity Slow  Appearance/Hygiene Body odor;In scrubs  Behavior Characteristics Cooperative;Agitated  Mood Anxious;Preoccupied;Irritable  Thought Process  Coherency Concrete thinking  Content Blaming self;Preoccupation  Delusions Paranoid  Perception Hallucinations  Hallucination Auditory;Command  Judgment Impaired  Confusion None  Danger to Self  Current suicidal ideation? Denies  Danger to Others  Danger to Others None reported or observed   Pt sitting in dayroom. Denies SI, HI, and pain. Says his voices come on without warning and are commanding. They cause him to be aggressive and he becomes abusive. Pt given Geodon 20 mg IM because he felt he could escalate. Pt playing card game with a peer now. Pt has insight into his condition. Pt says he wants to get better so he can return to work. 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

## 2021-01-10 DIAGNOSIS — F333 Major depressive disorder, recurrent, severe with psychotic symptoms: Secondary | ICD-10-CM

## 2021-01-10 MED ORDER — BENZTROPINE MESYLATE 0.5 MG PO TABS: 0.5000 mg | ORAL_TABLET | Freq: Four times a day (QID) | ORAL | Status: DC | PRN

## 2021-01-10 MED ORDER — RISPERIDONE 2 MG PO TBDP
2.0000 mg | ORAL_TABLET | Freq: Two times a day (BID) | ORAL | Status: DC
  Administered 2021-01-10 – 2021-01-12 (×5): 2 mg via ORAL
  Filled 2021-01-10 (×9): qty 1

## 2021-01-10 MED ORDER — BENZTROPINE MESYLATE 1 MG/ML IJ SOLN: 1.0000 mg | Freq: Four times a day (QID) | INTRAMUSCULAR | Status: DC | PRN

## 2021-01-10 NOTE — Progress Notes (Signed)
Pt HR elevated. Manual pulse check was 120 bpm on right radial. Provider notified. Pt says he feels like his heart is beating fast sometimes and it comes on all of a sudden. Pt c/o headache 3/10 and was given Tylenol 650 mg. Will continue to monitor.     01/10/21 2158  Vital Signs  Pulse Rate (!) 140  Pulse Rate Source Monitor  BP (!) 144/89  BP Location Left Arm  BP Method Automatic  Patient Position (if appropriate) Standing  Oxygen Therapy  SpO2 98 %

## 2021-01-10 NOTE — Progress Notes (Addendum)
Pt was getting nighttime PRNs when he sneezed and his nose started to bleed from the left nostril. Pt says that he has nose bleeds all the time. Pt given a washcloth and walked to his room and told to sit for ten minutes holding pressure.  Checked on pt about 10 minutes later and he was standing over the sink in the bathroom and his nosebleed was a steady drip. Pt given new washcloth and told to sit and hold his head down and this writer pinched the upper bridge of his nose while he held pressure. VS were assessed at that time. Manual pulse was taken and it was 116 bpm. Pt states he had a headache earlier. After approximately 5 minutes more, a clot formed and his nose ceased to bleed. Pt said he felt lightheaded. Pt assisted to stand. After a couple minutes he said he felt better. EBL about 15 cc. Plan was to assess pt VS again at 2130. Pt decided to make a phone call. Will assess after pt is off the telephone. Pt gait steady. Will notify provider of the situation.     01/10/21 2113  Vital Signs  Pulse Rate (!) 124  Pulse Rate Source Monitor  Resp (!) 21  BP (!) 117/94  BP Location Left Arm  BP Method Automatic  Patient Position (if appropriate) Sitting  Oxygen Therapy  SpO2 95 %
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
## 2021-01-10 NOTE — Progress Notes (Signed)
Recreation Therapy Notes  Date: 4.22.22 Time: 1000 Location: 500 Hall Dayroom  Group Topic: Communication, Team Building, Problem Solving   Goal Area(s) Addresses:  Patient will effectively work with peer towards shared goal.  Patient will identify skill used to make activity successful.  Patient will identify how skills used during activity can be used to reach post d/c goals.   Behavioral Response: Engaged  Intervention: Cup Merchant navy officer bands with attached strings enough for each group member, 10 or more cups  Activity: Patient(s) were given a set of solo cups, a rubber band, and some tied strings. The objective is to build a pyramid with the cups by only using the rubber band and string to move the cups. After the activity the patient(s) are LRT debriefed and discussed what strategies worked, what didn't, and what lessons they can take from the activity and use in life post discharge.   Education Areas: Social Skills, Support System, Discharge Planning   Education Outcome: Acknowledges education  Clinical Observations/Feedback: Patient was appropriate and worked well with peers.  Patient was able to focus and listen to the recommendations of his peers.  Patient did a good job trying to follow along with the plans the group tried in order complete the activity.      Caroll Rancher, LRT/CTRS    Lillia Abed, Marliss Buttacavoli A 01/10/2021 11:11 AM

## 2021-01-10 NOTE — Progress Notes (Addendum)
   01/10/21 2023  Psych Admission Type (Psych Patients Only)  Admission Status Voluntary  Psychosocial Assessment  Patient Complaints Anxiety  Eye Contact Fair  Facial Expression Worried;Anxious  Affect Anxious;Preoccupied  Speech Loud;Slow;Logical/coherent  Interaction Assertive  Motor Activity Slow  Appearance/Hygiene Body odor  Behavior Characteristics Cooperative  Mood Preoccupied;Anxious  Thought Process  Coherency Concrete thinking  Content Preoccupation  Delusions None reported or observed  Perception Hallucinations  Hallucination Auditory;Command  Judgment Impaired  Confusion None  Danger to Self  Current suicidal ideation? Denies  Danger to Others  Danger to Others None reported or observed   Pt seen in dayroom interacting with peers. Pt calm. Says that the voices are starting to be affected by the medicine. Pt given Risperdal today. Pt says he feels good. Wants to get better and go home. Denies SI, HI, VH and pain. Rates anxiety 5/10.

## 2021-01-10 NOTE — [facility] Group Notes (Signed)
[facility] Group Notes:  (Nursing/MHT/Case Management/Adjunct)  Date:  01/10/2021  Time:  10:00 AM  Type of Therapy:  Group Therapy  Participation Level:  Minimal  Participation Quality:  Resistant  Affect:  Appropriate and Flat  Cognitive:  Alert and Appropriate  Insight:  Improving  Engagement in Group:  Improving and Lacking  Modes of Intervention:  Orientation  Summary of Progress/Problems: His goal for today is to stay positive and have a better day than yesterday.   [name] J [name] 01/10/2021, 10:00 AM

## 2021-01-10 NOTE — [facility] Suicide Risk Assessment (Signed)
[facility] INPATIENT:  Family/Significant Other Suicide Prevention Education  Suicide Prevention Education:  Education Completed; Daniel Nones [phone] [facility] [employer] Special[employer]) has been identified by the patient as the family member/significant other with whom the patient will be residing, and identified as the person(s) who will aid the patient in the event of a mental health crisis (suicidal ideations/suicide attempt).  With written consent from the patient, the family member/significant other has been provided the following suicide prevention education, prior to the and/or following the discharge of the patient.  The suicide prevention education provided includes the following:  Suicide risk factors  Suicide prevention and interventions  National Suicide Hotline telephone number  [facility] assessment telephone number  [facility] Emergency Assistance 911  [facility] and/or Residential Mobile Crisis Unit telephone number  Request made of family/significant other to:  Remove weapons (e.g., guns, rifles, knives), all items previously/currently identified as safety concern.    Remove drugs/medications (over-the-counter, prescriptions, illicit drugs), all items previously/currently identified as a safety concern.  The family member/significant other verbalizes understanding of the suicide prevention education information provided.  The family member/significant other agrees to remove the items of safety concern listed above.  CSW spoke with Mr. Elizebeth Koller who states that he has been working with [name] to find employment.  Mr. Elizebeth Koller states that Deloy is also working with the [employer] for housing and has a referral in at [facility] for a CST team.  Mr. Elizebeth Koller states that Alixander has not been taking his medications and is currently living with his brother but is not suppose to be there due to the housing rules.  Mr. Elizebeth Koller states that Dwyne is  established with [facility] for therapy and medication management.  Mr. Elizebeth Koller states that Jamichael use to have a CST team with [facility] but that team is no longer active and left [place] without as many follow-up services.  CSW completed SPE with Mr. Elizebeth Koller.   Metro Kung Redina Zeller 01/10/2021, 3:07 PM

## 2021-01-10 NOTE — Progress Notes (Signed)
Pt rates his depression and hopeless both 1/10 and anxiety 4/10. Pt's goal for today "To stay positive, trying to keep a clear head". Presents with blunted affect, intermittent irritability / agitation and auditory hallucinations "I can hear the voices". PRN Zyprexa Zydis and Vistaril given at 0756 (See EMAR). Pt reports improvement in mood and appears more interactive with unit routines when reassessed. Pt remains medication compliant. Denies adverse drug reactions when assessed. Tolerates all PO intake well. Emotional support and encouragement provided to pt. All medications given as ordered and effects monitored. Safety checks maintained at Q 15 minutes intervals. Pt is verbally redirectable at this time.

## 2021-01-10 NOTE — [facility] Group Notes (Signed)
[facility] LCSW Group Therapy  01/10/2021 11:55 AM  Type of Therapy:  Group Therapy: Strengths Exploration   Participation Level:  Active  Participation Quality:  Appropriate  Affect:  Appropriate  Cognitive:  Lacking  Insight:  Developing/Improving  Engagement in Therapy:  Developing/Improving  Modes of Intervention:  Exploration  Summary of Progress/Problems: Patient attended and participated in group. Patient shared that he enjoys learning new things, and physical strength is also a strength of his.     Shauntia Levengood A Dio Giller 01/10/2021, 11:55 AM

## 2021-01-11 MED ORDER — HYDROXYZINE HCL 50 MG PO TABS
50.0000 mg | ORAL_TABLET | Freq: Three times a day (TID) | ORAL | Status: DC | PRN
  Administered 2021-01-11 – 2021-01-16 (×6): 50 mg via ORAL
  Filled 2021-01-11 (×6): qty 1

## 2021-01-11 MED ORDER — PROPRANOLOL HCL 10 MG PO TABS
10.0000 mg | ORAL_TABLET | Freq: Three times a day (TID) | ORAL | Status: DC
  Administered 2021-01-11 – 2021-01-12 (×3): 10 mg via ORAL
  Filled 2021-01-11 (×10): qty 1

## 2021-01-11 NOTE — [facility] Group Notes (Signed)
  [facility] LCSW Group Therapy Note  Date/Time:  01/11/2021 11:15AM-12:00PM  Type of Therapy and Topic:  Group Therapy:  Feelings About Hospitalization  Participation Level:  Active   Description of Group This process group involved patients discussing their feelings related to being hospitalized, as well as the benefits they see to being in the hospital.  These feelings and benefits were itemized.  The group then brainstormed specific ways in which they could seek those same benefits when they discharge and return home.  Therapeutic Goals Patient will identify and describe positive and negative feelings related to hospitalization Patient will verbalize benefits of hospitalization to themselves personally Patients will brainstorm together ways they can obtain similar benefits in the outpatient setting, identify barriers to wellness and possible solutions  Summary of Patient Progress:  The patient expressed his primary feelings about being hospitalized are good, as he has been working to learn how to control his anger which he thinks is his biggest problem.  He stated he is willing to see a therapist at discharge to have somebody who will continue to work with him the way staff have worked with him here.  He wants to take his medicines, breathe deeply, and ground himself to reality.  He would like to go to work and look at things differently.  Therapeutic Modalities Cognitive Behavioral Therapy Motivational Interviewing    Ambrose Mantle, LCSW 01/11/2021, 11:57 AM

## 2021-01-11 NOTE — Progress Notes (Signed)
Pt brought back early from breakfast due to a nosebleed. Pt sitting in room. This writer held pressure on upper bridge of nose. Bleeding slowing down after a few minutes. Pt still holding pressure with a paper towel in the left nostril. Pt says nosebleeds are not this frequent and usually when he has a headache. Pt denies any headache this morning. Will continue to monitor.

## 2021-01-11 NOTE — Progress Notes (Signed)
[facility] MD Progress Note  01/11/2021 3:49 PM [name] [name]  MRN:  [id number]  Subjective: [name] [name] reports, "My mood is good. Things are going well today. No voices today so far".  Objective: 30 year old AA male with previous hx of mental illness. He is being admitted to the [facility] from [facility] with complaints of increased stress with hallucinations, suicidal & homicidal ideations. Planned to jump off a bridge. States he fought with his brother and the cops are after him which have been the triggers. Daily notes: [name] is seen, chart reviewed. The chart findings discussed with the treatment team. He presents alert, oriented & aware of situation. He is visible on the unit, attending group sessions. He says the voices in his head are calm today, none so far. He is tolerating his new doses on Risperdal for mood control. He currently denies any SIHI, AVH, delusional thoughts or paranoia. He does not appear to be responding to any internal stimuli. He denies any side effects.  Principal Problem: MDD (major depressive disorder), recurrent, severe, with psychosis (HCC)  Diagnosis: Principal Problem:   MDD (major depressive disorder), recurrent, severe, with psychosis (HCC) Active Problems:   Substance-induced psychotic disorder with delusions (HCC)  Total Time spent with patient: 25 minutes  Past Psychiatric History: See H&P  Past Medical History:  Past Medical History:  Diagnosis Date  . Schizophrenia (HCC)   . Tibial plateau fracture, left     Past Surgical History:  Procedure Laterality Date  . APPENDECTOMY    . LAPAROSCOPIC APPENDECTOMY N/A 07/16/2017   Procedure: APPENDECTOMY LAPAROSCOPIC;  Surgeon: Axel Filler, MD;  Location: [facility] OR;  Service: General;  Laterality: N/A;  . LAPAROTOMY N/A 07/16/2017   Procedure: POSSIBLE OPEN  LAPAROTOMY;  Surgeon: Axel Filler, MD;  Location: [facility] OR;  Service: General;  Laterality: N/A;  . NO PAST SURGERIES    . ORIF TIBIA PLATEAU  Left 04/15/2018   Procedure: OPEN REDUCTION INTERNAL FIXATION (ORIF) LEFT BICONDYLAR TIBIAL PLATEAU;  Surgeon: Tarry Kos, MD;  Location: [facility] OR;  Service: Orthopedics;  Laterality: Left;   Family History:  Family History  Problem Relation Age of Onset  . Other Mother   . Throat cancer Mother   . Other Brother    Family Psychiatric  History: See H&P  Social History:  Social History   Substance and Sexual Activity  Alcohol Use Not Currently     Social History   Substance and Sexual Activity  Drug Use Not Currently  . Types: Marijuana   Comment: denies use of marijuana     Social History   Socioeconomic History  . Marital status: Single    Spouse name: Not on file  . Number of children: Not on file  . Years of education: Not on file  . Highest education level: Not on file  Occupational History  . Not on file  Tobacco Use  . Smoking status: Current Some Day Smoker    Packs/day: 0.25    Years: 12.00    Pack years: 3.00    Types: Cigarettes  . Smokeless tobacco: Never Used  Vaping Use  . Vaping Use: Never used  Substance and Sexual Activity  . Alcohol use: Not Currently  . Drug use: Not Currently    Types: Marijuana    Comment: denies use of marijuana   . Sexual activity: Not Currently  Other Topics Concern  . Not on file  Social History Narrative  . Not on file   Social  Determinants of Health   Financial Resource Strain: Not on file  Food Insecurity: Not on file  Transportation Needs: Not on file  Physical Activity: Not on file  Stress: Not on file  Social Connections: Not on file   Additional Social History:   Sleep: Good  Appetite:  Good  Current Medications: Current Facility-Administered Medications  Medication Dose Route Frequency Provider Last Rate Last Admin  . acetaminophen (TYLENOL) tablet 650 mg  650 mg Oral Q6H PRN White, Patrice L, NP   650 mg at 01/10/21 2200  . alum & mag hydroxide-simeth (MAALOX/MYLANTA) 200-200-20 MG/5ML suspension  30 mL  30 mL Oral Q4H PRN White, Patrice L, NP      . benztropine (COGENTIN) tablet 0.5 mg  0.5 mg Oral Q6H PRN Claudie Revering, MD      . benztropine mesylate (COGENTIN) injection 1 mg  1 mg Intramuscular Q6H PRN Claudie Revering, MD      . hydrOXYzine (ATARAX/VISTARIL) tablet 50 mg  50 mg Oral TID PRN Armandina Stammer I, NP   50 mg at 01/11/21 0943  . LORazepam (ATIVAN) tablet 1 mg  1 mg Oral Q6H PRN White, Patrice L, NP   1 mg at 01/10/21 2054   And  . OLANZapine zydis (ZYPREXA) disintegrating tablet 10 mg  10 mg Oral Q8H PRN White, Patrice L, NP   10 mg at 01/10/21 2054   And  . ziprasidone (GEODON) injection 20 mg  20 mg Intramuscular Q6H PRN White, Patrice L, NP   20 mg at 01/09/21 2004  . magnesium hydroxide (MILK OF MAGNESIA) suspension 30 mL  30 mL Oral Daily PRN White, Patrice L, NP   30 mL at 01/10/21 0823  . propranolol (INDERAL) tablet 10 mg  10 mg Oral TID Armandina Stammer I, NP   10 mg at 01/11/21 0943  . risperiDONE (RISPERDAL M-TABS) disintegrating tablet 2 mg  2 mg Oral BID Claudie Revering, MD   2 mg at 01/11/21 7062  . traZODone (DESYREL) tablet 50 mg  50 mg Oral QHS PRN White, Patrice L, NP   50 mg at 01/10/21 2054    Lab Results:  No results found for this or any previous visit (from the past 48 hour(s)). Blood Alcohol level:  Lab Results  Component Value Date   ETH <10 01/07/2021   ETH <10 12/13/2018   Metabolic Disorder Labs: Lab Results  Component Value Date   HGBA1C 5.6 01/08/2021   MPG 114.02 01/08/2021   Lab Results  Component Value Date   PROLACTIN 5.6 01/14/2015   Lab Results  Component Value Date   CHOL 161 01/08/2021   TRIG 95 01/08/2021   HDL 33 (L) 01/08/2021   CHOLHDL 4.9 01/08/2021   VLDL 19 01/08/2021   LDLCALC 109 (H) 01/08/2021   LDLCALC 117 (H) 11/01/2019   Physical Findings: AIMS: Facial and Oral Movements Muscles of Facial Expression: None, normal Lips and Perioral Area: None, normal Jaw: None, normal Tongue: None, normal,Extremity  Movements Upper (arms, wrists, hands, fingers): None, normal Lower (legs, knees, ankles, toes): None, normal, Trunk Movements Neck, shoulders, hips: None, normal, Overall Severity Severity of abnormal movements (highest score from questions above): None, normal Incapacitation due to abnormal movements: None, normal Patient's awareness of abnormal movements (rate only patient's report): No Awareness, Dental Status Current problems with teeth and/or dentures?: No Does patient usually wear dentures?: No  CIWA:    COWS:     Musculoskeletal: Strength & Muscle Tone: within normal limits  Gait & Station: normal Patient leans: Right  Psychiatric Specialty Exam:  Presentation  General Appearance: Appropriate for Environment; Casual; Fairly Groomed  Eye Contact:Good  Speech:Clear and Coherent; Normal Rate  Speech Volume:Normal  Handedness:Right   Mood and Affect  Mood:Dysphoric ("Improving")  Affect:Appropriate  Thought Process  Thought Processes:Coherent  Descriptions of Associations:Intact  Orientation:Full (Time, Place and Person)  Thought Content:Logical  History of Schizophrenia/Schizoaffective disorder:No  Duration of Psychotic Symptoms:Less than six months  Hallucinations:No data recorded  Ideas of Reference:None  Suicidal Thoughts:No  Homicidal Thoughts:No   Sensorium  Memory:Immediate Fair; Recent Fair; Remote Fair  Judgment:Fair  Insight:Fair  Executive Functions  Concentration:Good  Attention Span:Good  Recall:Fair  Fund of Knowledge:Fair  Language:Fair  Psychomotor Activity  Psychomotor Activity:Normal  Assets  Assets:Communication Skills; Desire for Improvement; Resilience  Sleep  Sleep:No data recorded   Physical Exam: Physical Exam Vitals and nursing note reviewed.  HENT:     Head: Normocephalic.     Nose: Nose normal.     Mouth/Throat:     Pharynx: Oropharynx is clear.  Eyes:     Pupils: Pupils are equal, round, and  reactive to light.  Cardiovascular:     Rate and Rhythm: Normal rate.     Pulses: Normal pulses.  Pulmonary:     Effort: Pulmonary effort is normal.  Genitourinary:    Comments: Deferred Musculoskeletal:        General: Normal range of motion.     Cervical back: Normal range of motion.  Skin:    General: Skin is warm and dry.  Neurological:     General: No focal deficit present.     Mental Status: He is alert and oriented to person, place, and time. Mental status is at baseline.    Review of Systems  Constitutional: Negative for chills and fever.  HENT: Negative.   Eyes: Negative.   Respiratory: Negative.   Cardiovascular: Negative.   Gastrointestinal: Negative.   Genitourinary: Negative.   Musculoskeletal: Negative.   Skin: Negative.   Neurological: Negative for dizziness, tingling, tremors, sensory change, speech change, focal weakness, seizures, loss of consciousness, weakness and headaches.  Psychiatric/Behavioral: Positive for depression, hallucinations and substance abuse (Hx. cannabis use disorder). Negative for memory loss and suicidal ideas. The patient is not nervous/anxious and does not have insomnia.    Blood pressure 135/85, pulse (!) 122, temperature (!) 97.4 F (36.3 C), temperature source Oral, resp. rate (!) 21, height 5\' 6"  (1.676 m), weight 101.2 kg, SpO2 100 %. Body mass index is 35.99 kg/m.  Treatment Plan Summary: Daily contact with patient to assess and evaluate symptoms and progress in treatment and Medication management.  Continue inpatient hospitalization. Will continue today 01/11/2021 plan as below except where it is noted.  Mood control. Continue Risperdal M-tabs 2 mg po bid.  EPS. Continue Cogentin 0.5 mg po Q 6 hrs prn. Continue Cogentin 1 mg IM Q 6 hrs prn.  Anxiety.  Increased Vistaril to 50 mg po Q 8 hrs prn. Initiated Propranolol 10 mg po bid for anxiety.  Insomnia Continue Trazodone 50 mg po Q hs prn.   PRN  medications. Tylenol 650 mg po Q 6 hrs prn for pain/fever. Mylanta 30 ml po Q 4 hrs prn for indigestion. MOM 30 ml po q daily prn for constipation. The agitation/psychosis: as recommended prn per protocols.   Encourage group participation. Discharge disposition plan is ongoing.  01/13/2021, NP, PMHNP, FNP-BC 01/11/2021, 3:49 PMPatient ID: 01/13/2021, male   DOB: May 18, 1991,  30 y.o.   MRN: [id number]

## 2021-01-11 NOTE — Progress Notes (Signed)
Pt appeared to be having a hard time winding down this evening, pt complaining of the [facility]

## 2021-01-11 NOTE — Progress Notes (Addendum)
Pt stated he was doing better the voices are doing better. Pt requested PRN Zyprexa that was given with PRN Vistaril and Trazodone per MAR. Pt said he believes the medications afe helping reduce the severity of the voices and he likes to take the medication before the voices gets loud at times. Pt also given PRN Ativan per [name]    01/11/21 2200  Psych Admission Type (Psych Patients Only)  Admission Status Voluntary  Psychosocial Assessment  Patient Complaints Anxiety  Eye Contact Fair  Facial Expression Worried;Anxious  Affect Anxious;Preoccupied  Speech Logical/coherent;Soft  Interaction Assertive  Motor Activity Slow  Appearance/Hygiene Body odor  Behavior Characteristics Cooperative  Mood Preoccupied  Thought Process  Coherency Concrete thinking  Content Preoccupation  Delusions None reported or observed  Perception Hallucinations  Hallucination Auditory;Command  Judgment Impaired  Confusion None  Danger to Self  Current suicidal ideation? Denies  Danger to Others  Danger to Others None reported or observed

## 2021-01-12 MED ORDER — METOPROLOL TARTRATE 25 MG PO TABS
25.0000 mg | ORAL_TABLET | Freq: Two times a day (BID) | ORAL | Status: DC
  Administered 2021-01-12 – 2021-01-13 (×2): 25 mg via ORAL
  Filled 2021-01-12 (×4): qty 1

## 2021-01-12 MED ORDER — RISPERIDONE 1 MG PO TBDP
3.0000 mg | ORAL_TABLET | Freq: Two times a day (BID) | ORAL | Status: DC
  Filled 2021-01-12 (×2): qty 3

## 2021-01-12 MED ORDER — CLONIDINE HCL 0.1 MG PO TABS
0.1000 mg | ORAL_TABLET | Freq: Three times a day (TID) | ORAL | Status: DC | PRN
  Administered 2021-01-14: 0.1 mg via ORAL
  Filled 2021-01-12: qty 1

## 2021-01-12 MED ORDER — RISPERIDONE 2 MG PO TBDP
4.0000 mg | ORAL_TABLET | Freq: Two times a day (BID) | ORAL | Status: DC
  Administered 2021-01-12 – 2021-01-16 (×8): 4 mg via ORAL
  Filled 2021-01-12 (×10): qty 2

## 2021-01-12 MED ORDER — RISPERIDONE 1 MG PO TBDP
1.0000 mg | ORAL_TABLET | ORAL | Status: AC
  Administered 2021-01-12: 1 mg via ORAL
  Filled 2021-01-12: qty 1

## 2021-01-12 NOTE — Progress Notes (Signed)
Pt did not attend orientation group.  

## 2021-01-12 NOTE — Progress Notes (Addendum)
Pt received PRNs Zyprexa Zydis 10 mg and Ativan 1 mg PO at 1441 for increase agitation and worsening auditory hallucinations. Pt slammed bathroom door X2, punched wall, was yelling and actively responding to internal stimuli "Leave me alone, shut up, I can't handle it". Appears calm and reports decrease stimulation when reassessed at 1540. Pt went off unit to courtyard with peers, played basketball and returned to unit without outburst or self harm behavior.

## 2021-01-12 NOTE — Progress Notes (Signed)
Pt continues to endorse paranoia and command auditory hallucinations that are "worse at night. I'm not seeing anything, it's just the voices. If I let them control me they will win". Noted with hyperactivity as evidenced by restless and frequent intermittent  Pacing on unit. Cautious on interactions but is cooperative with care and attends scheduled groups. Reports he slept well last night with good appetite, high energy and good concentration level. Goal for today is "To keep a clear head and stay positive". Tolerates all medications and meals well without discomfort. Emotional support offered. Encouraged pt to attend to his ADLs. Safety maintained on and off unit without outburst or self harm gestures.

## 2021-01-12 NOTE — Progress Notes (Signed)
[facility]  01/12/2021 12:10 PM [name] [name]  MRN:  [id number] Subjective: Patient is a 30 year old male with a reported past psychiatric history significant for schizophrenia who [facility] on 01/07/2021 with plans to jump off a bridge, having auditory hallucinations as well as paranoia.  He stated that his suicidality had been stimulated because he had been homeless for approximately a week.  He gave up his apartment, and had been living with his brother.  He does not get along with his brother and reported daily arguments.  Objective: Patient is seen and examined.  Patient is a 30 year old male with the above-stated past psychiatric history who is seen in follow-up.  He ended up receiving Geodon last night.  He stated the voices were worse.  He had told the nurse practitioner yesterday his voices were better.  He did sleep 5.5 hours last night.  We discussed his current medications.  He continues on Cogentin, Atarax, Zyprexa as needed, Inderal and Risperdal 2 mg p.o. twice daily.  He is also on trazodone 50 mg p.o. nightly.  Review of the electronic medical record revealed his last admission at our facility in 2020 only had trazodone for sleep.  He will visit on 03/21/2020 where he was started on Abilify 9 mg p.o. daily for auditory hallucinations.  Review of his admission laboratories revealed an elevated alkaline phosphatase at 155 but otherwise normal liver function enzymes.  The rest of his electrolytes were normal.  CBC was normal.  Lipid panel was normal.  Acetaminophen was less than 10, salicylate was less than 7.  Hemoglobin A1c was 5.6.  His TSH was low normal at 0.596.  Respiratory panel was negative for influenza A, B and coronavirus.  Blood alcohol was less than 10, drug screen was positive for marijuana.  EKG showed a normal sinus rhythm with a normal QTc interval.  He is tachycardic today with a rate between 113 and 145.   Blood pressure is elevated between 124/107 and 147/90.  Pulse oximetry on room air was 100%.  He did sleep 5.5 hours.  Principal Problem: MDD (major depressive disorder), recurrent, severe, with psychosis (HCC) Diagnosis: Principal Problem:   MDD (major depressive disorder), recurrent, severe, with psychosis (HCC) Active Problems:   Substance-induced psychotic disorder with delusions (HCC)   Undifferentiated schizophrenia (HCC)  Total Time spent with patient: 20 minutes  Past Psychiatric History: See admission H&P  Past Medical History:  Past Medical History:  Diagnosis Date  . Schizophrenia (HCC)   . Tibial plateau fracture, left     Past Surgical History:  Procedure Laterality Date  . APPENDECTOMY    . LAPAROSCOPIC APPENDECTOMY N/A 07/16/2017   Procedure: APPENDECTOMY LAPAROSCOPIC;  Surgeon: Axel Filleramirez, Armando, MD;  [facility];  Service: General;  Laterality: N/A;  . LAPAROTOMY N/A 07/16/2017   Procedure: POSSIBLE OPEN  LAPAROTOMY;  Surgeon: Axel Filleramirez, Armando, MD;  [facility];  Service: General;  Laterality: N/A;  . NO PAST SURGERIES    . ORIF TIBIA PLATEAU Left 04/15/2018   Procedure: OPEN REDUCTION INTERNAL FIXATION (ORIF) LEFT BICONDYLAR TIBIAL PLATEAU;  Surgeon: Tarry KosXu, Naiping M, MD;  Location: [facility] OR;  Service: Orthopedics;  Laterality: Left;   Family History:  Family History  Problem Relation Age of Onset  . Other Mother   . Throat cancer Mother   . Other Brother    Family Psychiatric  History: See admission H&P Social History:  Social History   Substance and  Sexual Activity  Alcohol Use Not Currently     Social History   Substance and Sexual Activity  Drug Use Not Currently  . Types: Marijuana   Comment: denies use of marijuana     Social History   Socioeconomic History  . Marital status: Single    Spouse name: Not on file  . Number of children: Not on file  . Years of education: Not on file  . Highest education level: Not on file  Occupational  History  . Not on file  Tobacco Use  . Smoking status: Current Some Day Smoker    Packs/day: 0.25    Years: 12.00    Pack years: 3.00    Types: Cigarettes  . Smokeless tobacco: Never Used  Vaping Use  . Vaping Use: Never used  Substance and Sexual Activity  . Alcohol use: Not Currently  . Drug use: Not Currently    Types: Marijuana    Comment: denies use of marijuana   . Sexual activity: Not Currently  Other Topics Concern  . Not on file  Social History Narrative  . Not on file   Social Determinants of Health   Financial Resource Strain: Not on file  Food Insecurity: Not on file  Transportation Needs: Not on file  Physical Activity: Not on file  Stress: Not on file  Social Connections: Not on file   Additional Social History:                         Sleep: Fair  Appetite:  Good  Current Medications: Current Facility-Administered Medications  Medication Dose Route Frequency Provider Last Rate Last Admin  . acetaminophen (TYLENOL) tablet 650 mg  650 mg Oral Q6H PRN White, Patrice L, NP   650 mg at 01/10/21 2200  . alum & mag hydroxide-simeth (MAALOX/MYLANTA) 200-200-20 MG/5ML suspension 30 mL  30 mL Oral Q4H PRN White, Patrice L, NP      . benztropine (COGENTIN) tablet 0.5 mg  0.5 mg Oral Q6H PRN Claudie Revering, MD      . benztropine mesylate (COGENTIN) injection 1 mg  1 mg Intramuscular Q6H PRN Claudie Revering, MD      . hydrOXYzine (ATARAX/VISTARIL) tablet 50 mg  50 mg Oral TID PRN Armandina Stammer I, NP   50 mg at 01/11/21 2101  . LORazepam (ATIVAN) tablet 1 mg  1 mg Oral Q6H PRN White, Patrice L, NP   1 mg at 01/11/21 2230   And  . OLANZapine zydis (ZYPREXA) disintegrating tablet 10 mg  10 mg Oral Q8H PRN White, Patrice L, NP   10 mg at 01/11/21 2101   And  . ziprasidone (GEODON) injection 20 mg  20 mg Intramuscular Q6H PRN White, Patrice L, NP   20 mg at 01/09/21 2004  . magnesium hydroxide (MILK OF MAGNESIA) suspension 30 mL  30 mL Oral Daily PRN White,  Patrice L, NP   30 mL at 01/10/21 0823  . propranolol (INDERAL) tablet 10 mg  10 mg Oral TID Armandina Stammer I, NP   10 mg at 01/12/21 0728  . risperiDONE (RISPERDAL M-TABS) disintegrating tablet 2 mg  2 mg Oral BID Claudie Revering, MD   2 mg at 01/12/21 5053  . traZODone (DESYREL) tablet 50 mg  50 mg Oral QHS PRN White, Patrice L, NP   50 mg at 01/11/21 2101    Lab Results: No results found for this or any previous visit (from the  past 48 hour(s)).  Blood Alcohol level:  Lab Results  Component Value Date   ETH <10 01/07/2021   ETH <10 12/13/2018    Metabolic Disorder Labs: Lab Results  Component Value Date   HGBA1C 5.6 01/08/2021   MPG 114.02 01/08/2021   Lab Results  Component Value Date   PROLACTIN 5.6 01/14/2015   Lab Results  Component Value Date   CHOL 161 01/08/2021   TRIG 95 01/08/2021   HDL 33 (L) 01/08/2021   CHOLHDL 4.9 01/08/2021   VLDL 19 01/08/2021   LDLCALC 109 (H) 01/08/2021   LDLCALC 117 (H) 11/01/2019    Physical Findings: AIMS: Facial and Oral Movements Muscles of Facial Expression: None, normal Lips and Perioral Area: None, normal Jaw: None, normal Tongue: None, normal,Extremity Movements Upper (arms, wrists, hands, fingers): None, normal Lower (legs, knees, ankles, toes): None, normal, Trunk Movements Neck, shoulders, hips: None, normal, Overall Severity Severity of abnormal movements (highest score from questions above): None, normal Incapacitation due to abnormal movements: None, normal Patient's awareness of abnormal movements (rate only patient's report): No Awareness, Dental Status Current problems with teeth and/or dentures?: No Does patient usually wear dentures?: No  CIWA:    COWS:     Musculoskeletal: Strength & Muscle Tone: within normal limits Gait & Station: normal Patient leans: N/A  Psychiatric Specialty Exam:  Presentation  General Appearance: Casual  Eye Contact:Fair  Speech:Normal Rate  Speech  Volume:Normal  Handedness:Right   Mood and Affect  Mood:Dysphoric  Affect:Congruent   Thought Process  Thought Processes:Goal Directed  Descriptions of Associations:Circumstantial  Orientation:Full (Time, Place and Person)  Thought Content:Delusions  History of Schizophrenia/Schizoaffective disorder:Yes  Duration of Psychotic Symptoms:Greater than six months  Hallucinations:Hallucinations: Auditory  Ideas of Reference:Paranoia; Delusions  Suicidal Thoughts:Suicidal Thoughts: No  Homicidal Thoughts:Homicidal Thoughts: No   Sensorium  Memory:Immediate Fair; Recent Fair; Remote Fair  Judgment:Fair  Insight:Fair   Executive Functions  Concentration:Fair  Attention Span:Fair  Recall:Fair  Fund of Knowledge:Fair  Language:Fair   Psychomotor Activity  Psychomotor Activity:Psychomotor Activity: Normal   Assets  Assets:Desire for Improvement; Resilience   Sleep  Sleep:Sleep: Fair Number of Hours of Sleep: 5.5    Physical Exam: Physical Exam Vitals and nursing note reviewed.  Constitutional:      Appearance: Normal appearance.  HENT:     Head: Normocephalic and atraumatic.  Pulmonary:     Effort: Pulmonary effort is normal.  Neurological:     General: No focal deficit present.     Mental Status: He is alert and oriented to person, place, and time.    ROS Blood pressure (!) 147/90, pulse (!) 145, temperature (!) 97.3 F (36.3 C), temperature source Oral, resp. rate (!) 21, height 5\' 6"  (1.676 m), weight 101.2 kg, SpO2 100 %. Body mass index is 35.99 kg/m.   Treatment Plan Summary: Daily contact with patient to assess and evaluate symptoms and progress in treatment, Medication management and Plan : Patient is seen and examined.  Patient is a 30 year old male with the above-stated past psychiatric history who is seen in follow-up.   Diagnosis: 1.  Schizophrenia; paranoid 2.  Hypertension  Pertinent findings on examination today: 1.   Auditory hallucinations continue. 2.  Patient denies suicidal or homicidal ideation. 3.  He does have housing problems. 4.  Blood pressure remains elevated.  Plan: 1.  Increase Risperdal to 3 mg p.o. twice daily for psychosis and mood stability. 2.  Stop propranolol. 3.  Add metoprolol tartrate 25 mg p.o. twice daily for  tachycardia and hypertension. 4.  Continue Cogentin 0.5 mg p.o. every 6 hours as needed tremor and 1 mg IM every 6 hours as needed acute dystonic reaction. 5.  Continue hydroxyzine 50 mg p.o. 3 times daily as needed anxiety. 6.  Continue Zyprexa agitation protocol as needed. 7.  Disposition planning-in progress.  Antonieta Pert, MD 01/12/2021, 12:10 PM

## 2021-01-12 NOTE — Progress Notes (Signed)
   01/12/21 0500  Sleep  Number of Hours 5.5

## 2021-01-12 NOTE — Progress Notes (Signed)
   01/12/21 2250  Psych Admission Type (Psych Patients Only)  Admission Status Voluntary  Psychosocial Assessment  Patient Complaints Other (Comment) (pt sleeping)  Eye Contact Fair  Facial Expression Worried;Anxious  Affect Anxious;Preoccupied  Speech Logical/coherent;Soft  Interaction Assertive  Motor Activity Slow  Appearance/Hygiene Body odor  Behavior Characteristics Other (Comment) (pt sleeping)  Mood Other (Comment) (pt sleeping)  Thought Process  Coherency Unable to assess  Content UTA  Delusions UTA  Perception UTA  Hallucination UTA  Judgment UTA  Confusion UTA  Danger to Self  Current suicidal ideation? Denies (pt sleeping)  Danger to Others  Danger to Others None reported or observed  D: Patient in bed sleeping since beginning of shift. Respiration regular and unlabored.  A: 15 mins checks continue for patient safety.  R: Patient remains safe on the unit. Will continue to monitor for safety and stability.

## 2021-01-12 NOTE — [facility] Group Notes (Signed)
[facility] LCSW Group Therapy Note  Date/Time:  01/12/2021  11:00AM-12:00PM  Type of Therapy and Topic:  Group Therapy:  Music and Mood  Participation Level:  Active   Description of Group: In this process group, members listened to a variety of genres of music and identified that different types of music evoke different responses.  Patients were encouraged to identify music that was soothing for them and music that was energizing for them.  Patients discussed how this knowledge can help with wellness and recovery in various ways including managing depression and anxiety as well as encouraging healthy sleep habits.    Therapeutic Goals: 1. Patients will explore the impact of different varieties of music on mood 2. Patients will verbalize the thoughts they have when listening to different types of music 3. Patients will identify music that is soothing to them as well as music that is energizing to them 4. Patients will discuss how to use this knowledge to assist in maintaining wellness and recovery 5. Patients will explore the use of music as a coping skill  Summary of Patient Progress:  At the beginning of group, patient expressed that he felt good and great.  At the end of group, patient expressed that he felt calm and aware.    Therapeutic Modalities: Solution Focused Brief Therapy Activity   Ambrose Mantle, LCSW

## 2021-01-13 MED ORDER — FLUTICASONE PROPIONATE 50 MCG/ACT NA SUSP
2.0000 | Freq: Every day | NASAL | Status: DC
  Administered 2021-01-13 – 2021-01-17 (×5): 2 via NASAL
  Filled 2021-01-13 (×2): qty 16

## 2021-01-13 MED ORDER — METOPROLOL TARTRATE 50 MG PO TABS
50.0000 mg | ORAL_TABLET | Freq: Two times a day (BID) | ORAL | Status: DC
  Administered 2021-01-13 – 2021-01-14 (×2): 50 mg via ORAL
  Filled 2021-01-13 (×4): qty 1

## 2021-01-13 NOTE — Progress Notes (Incomplete)
[facility] MD Progress Note  01/13/2021 1:45 PM [name] [name]  MRN:  [id number]   Chief Complaint:   Subjective:  [name] [name] is a 30 y.o. male with previous psychiatric diagnoses including MDD with psychotic features, substance induced psychotic d/o, and schizophrenia, who was initially admitted for inpatient psychiatric hospitalization on 01/07/2021 for management of SI, AH, and paranoia. The patient is currently on Hospital Day 6.   Chart Review from last 24 hours:  The patient's chart was reviewed and nursing notes were reviewed. The patient's case was discussed in multidisciplinary team meeting. Per [facility]   Information Obtained Today During Patient Interview: The patient was seen and evaluated on the unit. On assessment today the patient reports ***  Principal Problem: MDD (major depressive disorder), recurrent, severe, with psychosis (HCC) Diagnosis: Principal Problem:   MDD (major depressive disorder), recurrent, severe, with psychosis (HCC) Active Problems:   Substance-induced psychotic disorder with delusions (HCC)   Undifferentiated schizophrenia (HCC)  Total Time Spent in Direct Patient Care:  I personally spent 30 minutes on the unit in direct patient care. The direct patient care time included face-to-face time with the patient, reviewing the patient's chart, communicating with other professionals, and coordinating care. Greater than 50% of this time was spent in counseling or coordinating care with the patient regarding goals of hospitalization, psycho-education, and discharge planning needs.  Past Psychiatric History: see admission H&P  Past Medical History:  Past Medical History:  Diagnosis Date  . Schizophrenia (HCC)   . Tibial plateau fracture, left     Past Surgical History:  Procedure Laterality Date  . APPENDECTOMY    . LAPAROSCOPIC APPENDECTOMY N/A 07/16/2017   Procedure: APPENDECTOMY LAPAROSCOPIC;  Surgeon: Axel Filler, MD;  Location: [facility] OR;  Service:  General;  Laterality: N/A;  . LAPAROTOMY N/A 07/16/2017   Procedure: POSSIBLE OPEN  LAPAROTOMY;  Surgeon: Axel Filler, MD;  Location: [facility] OR;  Service: General;  Laterality: N/A;  . NO PAST SURGERIES    . ORIF TIBIA PLATEAU Left 04/15/2018   Procedure: OPEN REDUCTION INTERNAL FIXATION (ORIF) LEFT BICONDYLAR TIBIAL PLATEAU;  Surgeon: Tarry Kos, MD;  Location: [facility] OR;  Service: Orthopedics;  Laterality: Left;   Family History:  Family History  Problem Relation Age of Onset  . Other Mother   . Throat cancer Mother   . Other Brother    Family Psychiatric  History: see admission H&P  Social History:  Social History   Substance and Sexual Activity  Alcohol Use Not Currently     Social History   Substance and Sexual Activity  Drug Use Not Currently  . Types: Marijuana   Comment: denies use of marijuana     Social History   Socioeconomic History  . Marital status: Single    Spouse name: Not on file  . Number of children: Not on file  . Years of education: Not on file  . Highest education level: Not on file  Occupational History  . Not on file  Tobacco Use  . Smoking status: Current Some Day Smoker    Packs/day: 0.25    Years: 12.00    Pack years: 3.00    Types: Cigarettes  . Smokeless tobacco: Never Used  Vaping Use  . Vaping Use: Never used  Substance and Sexual Activity  . Alcohol use: Not Currently  . Drug use: Not Currently    Types: Marijuana    Comment: denies use of marijuana   . Sexual activity: Not Currently  Other Topics  Concern  . Not on file  Social History Narrative  . Not on file   Social Determinants of Health   Financial Resource Strain: Not on file  Food Insecurity: Not on file  Transportation Needs: Not on file  Physical Activity: Not on file  Stress: Not on file  Social Connections: Not on file   Sleep: {B[facility] GOOD/FAIR/POOR:22877}  Appetite:  {BHH GOOD/FAIR/POOR:22877}  Current Medications: Current Facility-Administered  Medications  Medication Dose Route Frequency Provider Last Rate Last Admin  . acetaminophen (TYLENOL) tablet 650 mg  650 mg Oral Q6H PRN White, Patrice L, NP   650 mg at 01/10/21 2200  . alum & mag hydroxide-simeth (MAALOX/MYLANTA) 200-200-20 MG/5ML suspension 30 mL  30 mL Oral Q4H PRN White, Patrice L, NP      . benztropine (COGENTIN) tablet 0.5 mg  0.5 mg Oral Q6H PRN Claudie Revering, MD      . benztropine mesylate (COGENTIN) injection 1 mg  1 mg Intramuscular Q6H PRN Claudie Revering, MD      . cloNIDine (CATAPRES) tablet 0.1 mg  0.1 mg Oral TID PRN Antonieta Pert, MD      . fluticasone Aleda Grana) 50 MCG/ACT nasal spray 2 spray  2 spray Each Nare Daily Antonieta Pert, MD   2 spray at 01/13/21 1308  . hydrOXYzine (ATARAX/VISTARIL) tablet 50 mg  50 mg Oral TID PRN Armandina Stammer I, NP   50 mg at 01/11/21 2101  . LORazepam (ATIVAN) tablet 1 mg  1 mg Oral Q6H PRN White, Patrice L, NP   1 mg at 01/12/21 1441   And  . OLANZapine zydis (ZYPREXA) disintegrating tablet 10 mg  10 mg Oral Q8H PRN White, Patrice L, NP   10 mg at 01/12/21 1441   And  . ziprasidone (GEODON) injection 20 mg  20 mg Intramuscular Q6H PRN White, Patrice L, NP   20 mg at 01/09/21 2004  . magnesium hydroxide (MILK OF MAGNESIA) suspension 30 mL  30 mL Oral Daily PRN White, Patrice L, NP   30 mL at 01/10/21 0823  . metoprolol tartrate (LOPRESSOR) tablet 50 mg  50 mg Oral BID Antonieta Pert, MD      . risperiDONE (RISPERDAL M-TABS) disintegrating tablet 4 mg  4 mg Oral BID Antonieta Pert, MD   4 mg at 01/13/21 0744  . traZODone (DESYREL) tablet 50 mg  50 mg Oral QHS PRN White, Patrice L, NP   50 mg at 01/11/21 2101    Lab Results: No results found for this or any previous visit (from the past 48 hour(s)).  Blood Alcohol level:  Lab Results  Component Value Date   ETH <10 01/07/2021   ETH <10 12/13/2018    Metabolic Disorder Labs: Lab Results  Component Value Date   HGBA1C 5.6 01/08/2021   MPG 114.02  01/08/2021   Lab Results  Component Value Date   PROLACTIN 5.6 01/14/2015   Lab Results  Component Value Date   CHOL 161 01/08/2021   TRIG 95 01/08/2021   HDL 33 (L) 01/08/2021   CHOLHDL 4.9 01/08/2021   VLDL 19 01/08/2021   LDLCALC 109 (H) 01/08/2021   LDLCALC 117 (H) 11/01/2019    Physical Findings: AIMS: Facial and Oral Movements Muscles of Facial Expression: None, normal Lips and Perioral Area: None, normal Jaw: None, normal Tongue: None, normal,Extremity Movements Upper (arms, wrists, hands, fingers): None, normal Lower (legs, knees, ankles, toes): None, normal, Trunk Movements Neck, shoulders, hips: None, normal, Overall  Severity Severity of abnormal movements (highest score from questions above): None, normal Incapacitation due to abnormal movements: None, normal Patient's awareness of abnormal movements (rate only patient's report): No Awareness, Dental Status Current problems with teeth and/or dentures?: No Does patient usually wear dentures?: No      Musculoskeletal: Strength & Muscle Tone: within normal limits Gait & Station: normal, steady Patient leans: N/A  Psychiatric Specialty Exam: Physical Exam Vitals reviewed.  HENT:     Head: Normocephalic.  Pulmonary:     Effort: Pulmonary effort is normal.  Neurological:     Mental Status: He is alert.     Review of Systems  Blood pressure 135/81, pulse 100, temperature 97.9 F (36.6 C), temperature source Oral, resp. rate 18, height 5\' 6"  (1.676 m), weight 101.2 kg, SpO2 99 %.Body mass index is 35.99 kg/m.  General Appearance: {Appearance:22683}  Eye Contact:  {BHH EYE CONTACT:22684}  Speech:  {Speech:22685}  Volume:  {Volume (PAA):22686}  Mood:  {BHH MOOD:22306}  Affect:  {Affect (PAA):22687}  Thought Process:  {Thought Process (PAA):22688}  Orientation:  {BHH ORIENTATION (PAA):22689}  Thought Content:  {Thought Content:22690}  Suicidal Thoughts:  {ST/HT (PAA):22692}  Homicidal Thoughts:  {ST/HT  (PAA):22692}  Memory:  {BHH MEMORY:22881}  Judgement:  {Judgement (PAA):22694}  Insight:  {Insight (PAA):22695}  Psychomotor Activity:  {Psychomotor (PAA):22696}  Concentration:  {Concentration:21399}  Recall:  {BHH GOOD/FAIR/POOR:22877}  Fund of Knowledge:  {BHH GOOD/FAIR/POOR:22877}  Language:  {BHH GOOD/FAIR/POOR:22877}  Akathisia:  {BHH YES OR NO:22294}  Handed:  {Handed:22697}  AIMS (if indicated):     Assets:  {Assets (PAA):22698}  ADL's:  {BHH  Cognition:  {chl bhh cognition:304700322}  Sleep:  Number of Hours: 9.5   Treatment Plan Summary: Diagnoses / Active Problems: Unspecified schizophrenia spectrum and other psychotic d/o (r/o schizophrenia, r/o substance induced psychotic d/o, r/o MDD recurrent severe with psychotic features) HTN  PLAN: 1. Safety and Monitoring:  -- Admission to inpatient psychiatric unit for safety, stabilization and treatment  -- Daily contact with patient to assess and evaluate symptoms and progress in treatment  -- Patient's case to be discussed in multi-disciplinary team meeting  -- Observation Level : q15 minute checks  -- Vital signs:  q12 hours  -- Precautions: suicide  2. Psychiatric Diagnoses and Treatment:  Unspecified schizophrenia spectrum and other psychotic d/o (r/o schizophrenia, r/o substance induced psychotic d/o, r/o MDD recurrent severe with psychotic features)  -- Continue Risperdal M tabs 4mg  bid  -- Continue Cogentin 0.5mg  q6 hours PRN EPS/tremor  -- Continue Trazodone 50mg  po qhs PRN insomnia  -- Continue Vistaril 50mg  tid PRN anxiety  -- Continue Zyprexa zydis 10mg  q8 hours PRN agitation, Ativan 1mg  po q6 hours PRN agitation, and Geodon 20mg  IM q12hours PRN agitation  -- Metabolic profile and EKG monitoring obtained while on an atypical antipsychotic (BMI: 35.99; Lipid Panel: cholesterol 161, Triglycerides 95, HDL 33, LDL 109; HbgA1c:5.6 QTc:417ms)   -- Encouraged patient to participate in unit milieu and in  scheduled group therapies   -- Short Term Goals: Ability to identify changes in lifestyle to reduce recurrence of condition will improve, Ability to demonstrate self-control will improve and Ability to identify and develop effective coping behaviors will improve  -- Long Term Goals: Improvement in symptoms so as ready for discharge   Cannabis use   -- UDS positive for [facility]  -- Patient counseled on need to abstain from illicit substance use  -- Short Term Goals: Ability to identify triggers associated with substance abuse/mental health issues will  improve  -- Long Term Goals: Improvement in symptoms so as ready for discharge   3. Medical Issues Being Addressed:   HTN  -- Continue Clonidine 0.1mg  q8 hours PRN SBP >150 or HR greater than 120  -- Continue Metoprolol 50mg  bid   Leukocytosis (WBC 13.7 on admission)  -- Repeat CBC pending for trending   Elevated alkaline phosphatase (155 on admission)  -- appears chronically elevated compared to previous labs (260 1 year ago and 165-178 2 years ago)  -- will need PCP follow up after discharge  4. Discharge Planning:   -- Social work and case management to assist with discharge planning and identification of hospital follow-up needs prior to discharge  -- Estimated LOS: 5-7 days  -- Discharge Concerns: Need to establish a safety plan; Medication compliance and effectiveness  -- Discharge Goals: Return home with outpatient referrals for mental health follow-up including medication management/psychotherapy  Comer LocketAmy E Spero Gunnels, MD, FAPA 01/13/2021, 1:45 PM

## 2021-01-13 NOTE — [facility] Group Notes (Signed)
[facility] LCSW Group Therapy  Type of Therapy and Topic:  Group Therapy - Healthy vs Unhealthy Coping Skills  Participation Level:  Active   Description of Group The focus of this group was to determine what unhealthy coping techniques typically are used by group members and what healthy coping techniques would be helpful in coping with various problems. Patients were guided in becoming aware of the differences between healthy and unhealthy coping techniques. Patients were asked to identify 2-3 healthy coping skills they would like to learn to use more effectively.  Therapeutic Goals 1. Patients learned that coping is what human beings do all day long to deal with various situations in their lives 2. Patients defined and discussed healthy vs unhealthy coping techniques 3. Patients identified their preferred coping techniques and identified whether these were healthy or unhealthy 4. Patients determined 2-3 healthy coping skills they would like to become more familiar with and use more often. 5. Patients provided support and ideas to each other   Summary of Patient Progress:  Trestan participated in group and spoke with his peers about self-care and coping skills.  Adric spent time outside playing basketball and states that this is one of his healthy coping skills.    Therapeutic Modalities Cognitive Behavioral Therapy Motivational Interviewing

## 2021-01-13 NOTE — Progress Notes (Signed)
[facility]  01/13/2021 11:25 AM Derrell LollingMaurice M Wynes  MRN:  [id number] Subjective:  Patient is a 30 year old male with a reported past psychiatric history significant for schizophrenia who [facility]21 with plans to jump off a bridge, having auditory hallucinations as well as paranoia.  He stated that his suicidality had been stimulated because he had been homeless for approximately a week.  He gave up his apartment, and had been living with his brother.  He does not get along with his brother and reported daily arguments.  Objective: Patient is seen and examined.  Patient is a 30 year old male with the above-stated past psychiatric history who is seen in follow-up.  He stated he is doing better today.  He denied any auditory hallucinations.  He is anxious.  There is another patient on the unit who is much more aggressive, and apparently has been aggressive towards patient.  We discussed that.  He stated changes in the medications have helped greatly.  He stated that the auditory hallucinations are gone.  He still has some paranoia.  He denied any other side effects to his current medications.  He did sleep well last night.  His vital signs are stable, he is afebrile.  Initially his pulse was 124, but reduced to 100.  Pulse oximetry on room air was 99%.  He slept 9.5 hours last night.  No suicidal or homicidal ideation.  Principal Problem: MDD (major depressive disorder), recurrent, severe, with psychosis (HCC) Diagnosis: Principal Problem:   MDD (major depressive disorder), recurrent, severe, with psychosis (HCC) Active Problems:   Substance-induced psychotic disorder with delusions (HCC)   Undifferentiated schizophrenia (HCC)  Total Time spent with patient: 20 minutes  Past Psychiatric History: See admission H&P  Past Medical History:  Past Medical History:  Diagnosis Date  . Schizophrenia (HCC)   . Tibial plateau  fracture, left     Past Surgical History:  Procedure Laterality Date  . APPENDECTOMY    . LAPAROSCOPIC APPENDECTOMY N/A 07/16/2017   Procedure: APPENDECTOMY LAPAROSCOPIC;  Surgeon: Axel Filleramirez, Armando, MD;  [facility];  Service: General;  Laterality: N/A;  . LAPAROTOMY N/A 07/16/2017   Procedure: POSSIBLE OPEN  LAPAROTOMY;  Surgeon: Axel Filleramirez, Armando, MD;  [facility];  Service: General;  Laterality: N/A;  . NO PAST SURGERIES    . ORIF TIBIA PLATEAU Left 04/15/2018   Procedure: OPEN REDUCTION INTERNAL FIXATION (ORIF) LEFT BICONDYLAR TIBIAL PLATEAU;  Surgeon: Tarry KosXu, Naiping M, MD;  Location: [facility] OR;  Service: Orthopedics;  Laterality: Left;   Family History:  Family History  Problem Relation Age of Onset  . Other Mother   . Throat cancer Mother   . Other Brother    Family Psychiatric  History: See admission H&P Social History:  Social History   Substance and Sexual Activity  Alcohol Use Not Currently     Social History   Substance and Sexual Activity  Drug Use Not Currently  . Types: Marijuana   Comment: denies use of marijuana     Social History   Socioeconomic History  . Marital status: Single    Spouse name: Not on file  . Number of children: Not on file  . Years of education: Not on file  . Highest education level: Not on file  Occupational History  . Not on file  Tobacco Use  . Smoking status: Current Some Day Smoker    Packs/day: 0.25    Years: 12.00  Pack years: 3.00    Types: Cigarettes  . Smokeless tobacco: Never Used  Vaping Use  . Vaping Use: Never used  Substance and Sexual Activity  . Alcohol use: Not Currently  . Drug use: Not Currently    Types: Marijuana    Comment: denies use of marijuana   . Sexual activity: Not Currently  Other Topics Concern  . Not on file  Social History Narrative  . Not on file   Social Determinants of Health   Financial Resource Strain: Not on file  Food Insecurity: Not on file  Transportation Needs: Not on  file  Physical Activity: Not on file  Stress: Not on file  Social Connections: Not on file   Additional Social History:                         Sleep: Good  Appetite:  Good  Current Medications: Current Facility-Administered Medications  Medication Dose Route Frequency Provider Last Rate Last Admin  . acetaminophen (TYLENOL) tablet 650 mg  650 mg Oral Q6H PRN White, Patrice L, NP   650 mg at 01/10/21 2200  . alum & mag hydroxide-simeth (MAALOX/MYLANTA) 200-200-20 MG/5ML suspension 30 mL  30 mL Oral Q4H PRN White, Patrice L, NP      . benztropine (COGENTIN) tablet 0.5 mg  0.5 mg Oral Q6H PRN Claudie Revering, MD      . benztropine mesylate (COGENTIN) injection 1 mg  1 mg Intramuscular Q6H PRN Claudie Revering, MD      . cloNIDine (CATAPRES) tablet 0.1 mg  0.1 mg Oral TID PRN Antonieta Pert, MD      . hydrOXYzine (ATARAX/VISTARIL) tablet 50 mg  50 mg Oral TID PRN Armandina Stammer I, NP   50 mg at 01/11/21 2101  . LORazepam (ATIVAN) tablet 1 mg  1 mg Oral Q6H PRN White, Patrice L, NP   1 mg at 01/12/21 1441   And  . OLANZapine zydis (ZYPREXA) disintegrating tablet 10 mg  10 mg Oral Q8H PRN White, Patrice L, NP   10 mg at 01/12/21 1441   And  . ziprasidone (GEODON) injection 20 mg  20 mg Intramuscular Q6H PRN White, Patrice L, NP   20 mg at 01/09/21 2004  . magnesium hydroxide (MILK OF MAGNESIA) suspension 30 mL  30 mL Oral Daily PRN White, Patrice L, NP   30 mL at 01/10/21 0823  . metoprolol tartrate (LOPRESSOR) tablet 25 mg  25 mg Oral BID Antonieta Pert, MD   25 mg at 01/13/21 0744  . risperiDONE (RISPERDAL M-TABS) disintegrating tablet 4 mg  4 mg Oral BID Antonieta Pert, MD   4 mg at 01/13/21 0744  . traZODone (DESYREL) tablet 50 mg  50 mg Oral QHS PRN White, Patrice L, NP   50 mg at 01/11/21 2101    Lab Results: No results found for this or any previous visit (from the past 48 hour(s)).  Blood Alcohol level:  Lab Results  Component Value Date   [facility] <10 01/07/2021    ETH <10 12/13/2018    Metabolic Disorder Labs: Lab Results  Component Value Date   HGBA1C 5.6 01/08/2021   MPG 114.02 01/08/2021   Lab Results  Component Value Date   PROLACTIN 5.6 01/14/2015   Lab Results  Component Value Date   CHOL 161 01/08/2021   TRIG 95 01/08/2021   HDL 33 (L) 01/08/2021   CHOLHDL 4.9 01/08/2021   VLDL 19  01/08/2021   LDLCALC 109 (H) 01/08/2021   LDLCALC 117 (H) 11/01/2019    Physical Findings: AIMS: Facial and Oral Movements Muscles of Facial Expression: None, normal Lips and Perioral Area: None, normal Jaw: None, normal Tongue: None, normal,Extremity Movements Upper (arms, wrists, hands, fingers): None, normal Lower (legs, knees, ankles, toes): None, normal, Trunk Movements Neck, shoulders, hips: None, normal, Overall Severity Severity of abnormal movements (highest score from questions above): None, normal Incapacitation due to abnormal movements: None, normal Patient's awareness of abnormal movements (rate only patient's report): No Awareness, Dental Status Current problems with teeth and/or dentures?: No Does patient usually wear dentures?: No  CIWA:    COWS:     Musculoskeletal: Strength & Muscle Tone: within normal limits Gait & Station: normal Patient leans: N/A  Psychiatric Specialty Exam:  Presentation  General Appearance: Appropriate for Environment  Eye Contact:Fair  Speech:Normal Rate  Speech Volume:Normal  Handedness:Right   Mood and Affect  Mood:Anxious  Affect:Congruent   Thought Process  Thought Processes:Goal Directed  Descriptions of Associations:Intact  Orientation:Full (Time, Place and Person)  Thought Content:Delusions; Paranoid Ideation  History of Schizophrenia/Schizoaffective disorder:No  Duration of Psychotic Symptoms:Less than six months  Hallucinations:Hallucinations: Auditory  Ideas of Reference:Paranoia; Delusions  Suicidal Thoughts:Suicidal Thoughts: No  Homicidal  Thoughts:Homicidal Thoughts: No   Sensorium  Memory:Immediate Fair; Recent Fair; Remote Fair  Judgment:Fair  Insight:Fair   Executive Functions  Concentration:Fair  Attention Span:Fair  Recall:Fair  Fund of Knowledge:Fair  Language:Fair   Psychomotor Activity  Psychomotor Activity:Psychomotor Activity: Increased   Assets  Assets:Communication Skills; Resilience; Housing   Sleep  Sleep:Sleep: Good Number of Hours of Sleep: 9.5    Physical Exam: Physical Exam Vitals and nursing note reviewed.  Constitutional:      Appearance: Normal appearance.  HENT:     Head: Normocephalic and atraumatic.  Pulmonary:     Effort: Pulmonary effort is normal.  Neurological:     General: No focal deficit present.     Mental Status: He is alert and oriented to person, place, and time.    ROS Blood pressure 135/81, pulse 100, temperature 97.9 F (36.6 C), temperature source Oral, resp. rate 18, height 5\' 6"  (1.676 m), weight 101.2 kg, SpO2 99 %. Body mass index is 35.99 kg/m.   Treatment Plan Summary: Daily contact with patient to assess and evaluate symptoms and progress in treatment, Medication management and Plan : Patient is seen and examined.  Patient is a 30 year old male with the above-stated past psychiatric history who is seen in follow-up.   Diagnosis: 1.  Schizophrenia; paranoid 2.  Hypertension  Pertinent findings on examination today: 1.  Reduction in auditory hallucinations. 2.  Mood improvement with reduction in auditory hallucinations. 3.  He remains somewhat paranoid, but currently able to cope with aggressive patients on the unit. 4.  He continues to have housing issues and will discuss with social work. 5.  Blood pressure remains mildly elevated.  Plan: 1.  Continue Cogentin 0.5 mg p.o. every 6 hours as needed tremor from antipsychotic medications. 2.  Continue clonidine 0.1 mg p.o. 3 times daily as needed a systolic blood pressure greater than 150  or pulse greater than 120. 3.  Continue hydroxyzine 50 mg p.o. 3 times daily as needed anxiety. 4.  Increase metoprolol to tartrate to 50 mg p.o. twice daily for hypertension and tachycardia. 5.  Continue olanzapine Zyprexa agitation protocol as needed. 6.  Continue Risperdal 4 mg p.o. twice daily for psychosis. 7.  Continue trazodone 50 mg  p.o. nightly as needed insomnia. 8.  Disposition planning-he is essentially homeless and we will have to work on disposition.   Antonieta Pert, MD 01/13/2021, 11:25 AM

## 2021-01-13 NOTE — Progress Notes (Signed)
Adult Psychoeducational Group Note  Date:  01/13/2021 Time:  10:29 PM  Group Topic/Focus:  Wrap-Up Group:   The focus of this group is to help patients review their daily goal of treatment and discuss progress on daily workbooks.  Participation Level:  Minimal  Participation Quality:  Appropriate  Affect:  Appropriate  Cognitive:  Oriented  Insight: Lacking  Engagement in Group:  Engaged  Modes of Intervention:  Education  Additional Comments:  Patient attended and participated in group tonight.  Lita Mains [facility] 01/13/2021, 10:29 PM

## 2021-01-13 NOTE — Progress Notes (Signed)
MHT reported that pt in room punching the wall. Pt says his anxiety is up and he needs something. Pt seen at med window. Pt says his anxiety came on all of the sudden. Pt given PRNs per MAR to assist with lowering anxiety level. Will continue to monitor.

## 2021-01-13 NOTE — Progress Notes (Signed)
   01/13/21 0556  Vital Signs  Temp 97.9 F (36.6 C)  Temp Source Oral  Pulse Rate (!) 124  Pulse Rate Source Monitor  Resp 20  BP (!) 143/91  BP Location Right Arm  BP Method Automatic  Patient Position (if appropriate) Sitting  Oxygen Therapy  SpO2 100 %   D: Paitent denies SI/HI/AVH. Pt. Denied both anxiety and depression. Pt. Isolates in his room.  A:  Patient took scheduled medicine.  Support and encouragement provided Routine safety checks conducted every 15 minutes. Patient  Informed to notify staff with any concerns.  R:  Safety maintained.

## 2021-01-13 NOTE — Progress Notes (Addendum)
   01/13/21 2149  Psych Admission Type (Psych Patients Only)  Admission Status Voluntary  Psychosocial Assessment  Patient Complaints Anxiety  Eye Contact Fair  Facial Expression Flat  Affect Anxious  Speech Logical/coherent;Soft  Interaction Assertive  Motor Activity Slow  Appearance/Hygiene Body odor;In scrubs  Behavior Characteristics Cooperative  Mood Anxious  Thought Process  Coherency Concrete thinking  Content WDL  Delusions None reported or observed  Perception Hallucinations  Hallucination Auditory (came on suddenly)  Confusion None  Danger to Self  Current suicidal ideation? Denies  Danger to Others  Danger to Others None reported or observed   Pt seen in dayroom. Says he had a good day. Came up to nurse's station because his anxiety spiked when he went to the bathroom. Pt given Vistaril 50 mg. Pt states that his anxiety is 6/10. Pt comes to staff when he is having difficulty with anxiety and/or AH to head off any escalation.

## 2021-01-13 NOTE — Progress Notes (Signed)
Recreation Therapy Notes  Date: 4.25.22 Time: 1000 Location: 500 Hall Dayroom  Group Topic: Coping Skills  Goal Area(s) Addresses:  Patient will identify positive coping strategies. Patient will identify benefits of positive coping strategies. Patient will identify negative coping strategies. Patient will identify setbacks of using negative coping strategies.  Behavioral Response: None  Intervention: Worksheet, Pencils  Activity: Healthy vs. Unhealthy Coping Strategies.  Patients were to identify a problem they are currently facing.  Patients were to identify unhealthy coping strategies they have used and consequences of it.  Patients would then identify healthy coping strategies, expected outcomes and barriers to using these healthy coping strategies.  Education: Pharmacologist, Building control surveyor.   Education Outcome: Acknowledges understanding/In group clarification offered/Needs additional education.   Clinical Observations/Feedback: Pt came in for the last few minutes of group.  Pt was threatened by peer because peer felt some kind of way for pt sitting beside him and then moving to another seat.  Pt asked peer what he did wrong and peer was unable to tell him.  Pt left and did not return.    Caroll Rancher, LRT/CTRS   Caroll Rancher A 01/13/2021 11:26 AM

## 2021-01-13 NOTE — Tx Team (Signed)
Interdisciplinary Treatment and Diagnostic Plan Update  01/13/2021 Time of Session: 9:25am  [name] [name] MRN: [id number]  Principal Diagnosis: MDD (major depressive disorder), recurrent, severe, with psychosis (HCC)  Secondary Diagnoses: Principal Problem:   MDD (major depressive disorder), recurrent, severe, with psychosis (HCC) Active Problems:   Substance-induced psychotic disorder with delusions (HCC)   Undifferentiated schizophrenia (HCC)   Current Medications:  Current Facility-Administered Medications  Medication Dose Route Frequency Provider Last Rate Last Admin  . acetaminophen (TYLENOL) tablet 650 mg  650 mg Oral Q6H PRN White, Patrice L, NP   650 mg at 01/10/21 2200  . alum & mag hydroxide-simeth (MAALOX/MYLANTA) 200-200-20 MG/5ML suspension 30 mL  30 mL Oral Q4H PRN White, Patrice L, NP      . benztropine (COGENTIN) tablet 0.5 mg  0.5 mg Oral Q6H PRN Claudie Revering, MD      . benztropine mesylate (COGENTIN) injection 1 mg  1 mg Intramuscular Q6H PRN Claudie Revering, MD      . cloNIDine (CATAPRES) tablet 0.1 mg  0.1 mg Oral TID PRN Antonieta Pert, MD      . fluticasone Aleda Grana) 50 MCG/ACT nasal spray 2 spray  2 spray Each Nare Daily Antonieta Pert, MD   2 spray at 01/13/21 1308  . hydrOXYzine (ATARAX/VISTARIL) tablet 50 mg  50 mg Oral TID PRN Armandina Stammer I, NP   50 mg at 01/11/21 2101  . LORazepam (ATIVAN) tablet 1 mg  1 mg Oral Q6H PRN White, Patrice L, NP   1 mg at 01/12/21 1441   And  . OLANZapine zydis (ZYPREXA) disintegrating tablet 10 mg  10 mg Oral Q8H PRN White, Patrice L, NP   10 mg at 01/12/21 1441   And  . ziprasidone (GEODON) injection 20 mg  20 mg Intramuscular Q6H PRN White, Patrice L, NP   20 mg at 01/09/21 2004  . magnesium hydroxide (MILK OF MAGNESIA) suspension 30 mL  30 mL Oral Daily PRN White, Patrice L, NP   30 mL at 01/10/21 0823  . metoprolol tartrate (LOPRESSOR) tablet 50 mg  50 mg Oral BID Antonieta Pert, MD      . risperiDONE  (RISPERDAL M-TABS) disintegrating tablet 4 mg  4 mg Oral BID Antonieta Pert, MD   4 mg at 01/13/21 0744  . traZODone (DESYREL) tablet 50 mg  50 mg Oral QHS PRN White, Patrice L, NP   50 mg at 01/11/21 2101   PTA Medications: No medications prior to admission.    Patient Stressors: Marital or family conflict Medication change or noncompliance  Patient Strengths: Geographical information systems officer for treatment/growth  Treatment Modalities: Medication Management, Group therapy, Case management,  1 to 1 session with clinician, Psychoeducation, Recreational therapy.   Physician Treatment Plan for Primary Diagnosis: MDD (major depressive disorder), recurrent, severe, with psychosis (HCC) Long Term Goal(s): Improvement in symptoms so as ready for discharge Improvement in symptoms so as ready for discharge   Short Term Goals: Ability to identify changes in lifestyle to reduce recurrence of condition will improve Ability to verbalize feelings will improve Ability to disclose and discuss suicidal ideas Ability to identify and develop effective coping behaviors will improve Compliance with prescribed medications will improve Ability to identify triggers associated with substance abuse/mental health issues will improve  Medication Management: Evaluate patient's response, side effects, and tolerance of medication regimen.  Therapeutic Interventions: 1 to 1 sessions, Unit Group sessions and Medication administration.  Evaluation of Outcomes: Progressing  Physician Treatment Plan for Secondary Diagnosis: Principal Problem:   MDD (major depressive disorder), recurrent, severe, with psychosis (HCC) Active Problems:   Substance-induced psychotic disorder with delusions (HCC)   Undifferentiated schizophrenia (HCC)  Long Term Goal(s): Improvement in symptoms so as ready for discharge Improvement in symptoms so as ready for discharge   Short Term Goals: Ability to identify changes in  lifestyle to reduce recurrence of condition will improve Ability to verbalize feelings will improve Ability to disclose and discuss suicidal ideas Ability to identify and develop effective coping behaviors will improve Compliance with prescribed medications will improve Ability to identify triggers associated with substance abuse/mental health issues will improve     Medication Management: Evaluate patient's response, side effects, and tolerance of medication regimen.  Therapeutic Interventions: 1 to 1 sessions, Unit Group sessions and Medication administration.  Evaluation of Outcomes: Progressing   RN Treatment Plan for Primary Diagnosis: MDD (major depressive disorder), recurrent, severe, with psychosis (HCC) Long Term Goal(s): Knowledge of disease and therapeutic regimen to maintain health will improve  Short Term Goals: Ability to remain free from injury will improve, Ability to demonstrate self-control, Ability to participate in decision making will improve, Ability to verbalize feelings will improve, Ability to disclose and discuss suicidal ideas and Ability to identify and develop effective coping behaviors will improve  Medication Management: RN will administer medications as ordered by provider, will assess and evaluate patient's response and provide education to patient for prescribed medication. RN will report any adverse and/or side effects to prescribing provider.  Therapeutic Interventions: 1 on 1 counseling sessions, Psychoeducation, Medication administration, Evaluate responses to treatment, Monitor vital signs and CBGs as ordered, Perform/monitor CIWA, COWS, AIMS and Fall Risk screenings as ordered, Perform wound care treatments as ordered.  Evaluation of Outcomes: Progressing   LCSW Treatment Plan for Primary Diagnosis: MDD (major depressive disorder), recurrent, severe, with psychosis (HCC) Long Term Goal(s): Safe transition to appropriate next level of care at  discharge, Engage patient in therapeutic group addressing interpersonal concerns.  Short Term Goals: Engage patient in aftercare planning with referrals and resources, Increase social support, Increase emotional regulation, Facilitate acceptance of mental health diagnosis and concerns, Identify triggers associated with mental health/substance abuse issues and Increase skills for wellness and recovery  Therapeutic Interventions: Assess for all discharge needs, 1 to 1 time with Social worker, Explore available resources and support systems, Assess for adequacy in community support network, Educate family and significant other(s) on suicide prevention, Complete Psychosocial Assessment, Interpersonal group therapy.  Evaluation of Outcomes: Progressing   Progress in Treatment: Attending groups: Yes. Participating in groups: Yes. Taking medication as prescribed: Yes. Toleration medication: Yes. Family/Significant other contact made: Yes, individual(s) contacted:  Case worker, Marcello Moores. Patient understands diagnosis: Yes. Discussing patient identified problems/goals with staff: Yes. Medical problems stabilized or resolved: Yes. Denies suicidal/homicidal ideation: Yes. Issues/concerns per patient self-inventory: No.   New problem(s) identified: No, Describe:  None   New Short Term/Long Term Goal(s): medication stabilization, elimination of SI thoughts, development of comprehensive mental wellness plan.   Patient Goals:  "To make my mental health better"  Discharge Plan or Barriers: Patient has been given list of shelters.   Reason for Continuation of Hospitalization: Anxiety Depression Hallucinations Medication stabilization  Estimated Length of Stay: 3 to 5 days   Attendees: Patient:  01/08/2021   Physician:  01/08/2021   Nursing:  01/08/2021   RN Care Manager: 01/08/2021   Social Worker: Ruthann Cancer, LCSW 01/08/2021   Recreational Therapist:  01/08/2021  Other:  01/08/2021   Other:   01/08/2021   Other: 01/08/2021     Scribe for Treatment Team: Otelia Santee, LCSW 01/13/2021 1:47 PM

## 2021-01-13 NOTE — [facility] Group Notes (Signed)
[facility] Group Notes:  (Nursing/MHT/Case Management/Adjunct)  Date:  01/13/2021  Time:  9:35 AM  Type of Therapy:  Group Therapy  Participation Level:  Active  Participation Quality:  Attentive  Affect:  Appropriate  Cognitive:  Appropriate  Insight:  Appropriate  Engagement in Group:  Engaged  Modes of Intervention:  Orientation  Summary of Progress/Problems: His goal for today is to remain positive and allow the medications to work for him.   Maha Fischel J Winda Summerall 01/13/2021, 9:35 AM

## 2021-01-14 MED ORDER — TRAZODONE HCL 100 MG PO TABS
100.0000 mg | ORAL_TABLET | Freq: Every evening | ORAL | Status: DC | PRN
  Administered 2021-01-14 – 2021-01-16 (×2): 100 mg via ORAL
  Filled 2021-01-14 (×2): qty 1

## 2021-01-14 MED ORDER — METOPROLOL TARTRATE 100 MG PO TABS
100.0000 mg | ORAL_TABLET | Freq: Two times a day (BID) | ORAL | Status: DC
  Administered 2021-01-15: 100 mg via ORAL
  Filled 2021-01-14 (×4): qty 1

## 2021-01-14 MED ORDER — HALOPERIDOL 5 MG PO TABS
5.0000 mg | ORAL_TABLET | Freq: Two times a day (BID) | ORAL | Status: DC
  Administered 2021-01-14 – 2021-01-17 (×7): 5 mg via ORAL
  Filled 2021-01-14 (×10): qty 1

## 2021-01-14 NOTE — Progress Notes (Signed)
Pt did not attend orientation group.  

## 2021-01-14 NOTE — Progress Notes (Addendum)
   01/14/21 2000  Psych Admission Type (Psych Patients Only)  Admission Status Voluntary  Psychosocial Assessment  Patient Complaints None  Eye Contact Fair  Facial Expression Flat  Affect Anxious  Speech Logical/coherent;Soft  Interaction Assertive  Motor Activity Slow  Appearance/Hygiene Body odor;In scrubs  Behavior Characteristics Cooperative  Mood Pleasant  Thought Process  Coherency Concrete thinking  Content WDL  Delusions None reported or observed  Perception WDL  Hallucination None reported or observed  Judgment WDL  Confusion None  Danger to Self  Current suicidal ideation? Denies  Danger to Others  Danger to Others None reported or observed   Pt states he feels good today. Says he hasn't had any incidents of anxiety flare-ups today. Pt denies SI, HI, VH and pain. Says the voices are still there but softer than before.

## 2021-01-14 NOTE — Progress Notes (Signed)
   01/14/21 0607  Vital Signs  Temp 97.8 F (36.6 C)  Temp Source Oral  Pulse Rate (!) 130  Pulse Rate Source Monitor  BP 140/88  BP Location Right Arm  BP Method Automatic  Patient Position (if appropriate) Sitting  Oxygen Therapy  SpO2 99 %   Pt given PRN Clonidine 0.1 mg for elevated HR.

## 2021-01-14 NOTE — Progress Notes (Signed)
[facility] [name] [name] Progress Note  01/14/2021 11:52 AM [name] [name]  MRN:  [id number] Subjective:  Patient is a 30 year old male with a reported past psychiatric history significant for schizophrenia who presented to the [facility] emergency department on 01/07/2021 with plans to jump off a bridge, having auditory hallucinations as well as paranoia. He stated that his suicidality had been stimulated because he had been homeless for approximately a week. He gave up his apartment, and had been living with his brother. He does not get along with his brother and reported daily arguments.  Objective: Patient is seen and examined.  Patient is a 30 year old male with the above-stated past psychiatric history who is seen in follow-up.  Review of the electronic medical record revealed that the patient had gotten upset last p.m. at approximately 10:32 PM.  He was punching the wall.  The patient stated his anxiety was up and he needed something.  He eventually was given Ativan and was able to go to sleep.  This morning he is unable to really verbalize exactly what happened.  He stated he just got anxious and upset.  He stated the voices were worse.  This morning he denied auditory hallucinations and is calmer.  He is tachycardic this morning.  It says in the chart a rate between 165 and 128.  I do not believe that is correct and we will repeat that.  Initially his blood pressure was elevated at 147/127, but repeat was 117/85.  After he received the medication he did go to sleep for approximately 6 hours.  No new laboratories.  Principal Problem: MDD (major depressive disorder), recurrent, severe, with psychosis (HCC) Diagnosis: Principal Problem:   MDD (major depressive disorder), recurrent, severe, with psychosis (HCC) Active Problems:   Substance-induced psychotic disorder with delusions (HCC)   Undifferentiated schizophrenia (HCC)  Total Time spent with patient: 20 minutes  Past Psychiatric  History: See admission H&P  Past Medical History:  Past Medical History:  Diagnosis Date  . Schizophrenia (HCC)   . Tibial plateau fracture, left     Past Surgical History:  Procedure Laterality Date  . APPENDECTOMY    . LAPAROSCOPIC APPENDECTOMY N/A 07/16/2017   Procedure: APPENDECTOMY LAPAROSCOPIC;  Surgeon: Axel Filler, [name] [name];  Location: [facility] OR;  Service: General;  Laterality: N/A;  . LAPAROTOMY N/A 07/16/2017   Procedure: POSSIBLE OPEN  LAPAROTOMY;  Surgeon: Axel Filler, [name] [name];  Location: [facility] OR;  Service: General;  Laterality: N/A;  . NO PAST SURGERIES    . ORIF TIBIA PLATEAU Left 04/15/2018   Procedure: OPEN REDUCTION INTERNAL FIXATION (ORIF) LEFT BICONDYLAR TIBIAL PLATEAU;  Surgeon: Tarry Kos, [name] [name];  Location: [facility] OR;  Service: Orthopedics;  Laterality: Left;   Family History:  Family History  Problem Relation Age of Onset  . Other Mother   . Throat cancer Mother   . Other Brother    Family Psychiatric  History: See admission H&P Social History:  Social History   Substance and Sexual Activity  Alcohol Use Not Currently     Social History   Substance and Sexual Activity  Drug Use Not Currently  . Types: Marijuana   Comment: denies use of marijuana     Social History   Socioeconomic History  . Marital status: Single    Spouse name: Not on file  . Number of children: Not on file  . Years of education: Not on file  . Highest education level: Not on file  Occupational History  . Not on  file  Tobacco Use  . Smoking status: Current Some Day Smoker    Packs/day: 0.25    Years: 12.00    Pack years: 3.00    Types: Cigarettes  . Smokeless tobacco: Never Used  Vaping Use  . Vaping Use: Never used  Substance and Sexual Activity  . Alcohol use: Not Currently  . Drug use: Not Currently    Types: Marijuana    Comment: denies use of marijuana   . Sexual activity: Not Currently  Other Topics Concern  . Not on file  Social History Narrative  . Not on file    Social Determinants of Health   Financial Resource Strain: Not on file  Food Insecurity: Not on file  Transportation Needs: Not on file  Physical Activity: Not on file  Stress: Not on file  Social Connections: Not on file   Additional Social History:                         Sleep: Fair  Appetite:  Fair  Current Medications: Current Facility-Administered Medications  Medication Dose Route Frequency Provider Last Rate Last Admin  . acetaminophen (TYLENOL) tablet 650 mg  650 mg Oral Q6H PRN [name] [name], [name] [name], [name] [name]   650 mg at 01/10/21 2200  . alum & mag hydroxide-simeth (MAALOX/MYLANTA) 200-200-20 MG/5ML suspension 30 mL  30 mL Oral Q4H PRN [name] [name], [name] [name], [name] [name]      . benztropine (COGENTIN) tablet 0.5 mg  0.5 mg Oral Q6H PRN [name] Revering, [name] [name]      . benztropine mesylate (COGENTIN) injection 1 mg  1 mg Intramuscular Q6H PRN [name] Revering, [name] [name]      . cloNIDine (CATAPRES) tablet 0.1 mg  0.1 mg Oral TID PRN [name] Pert, [name] [name]   0.1 mg at 01/14/21 4098  . fluticasone (FLONASE) 50 MCG/ACT nasal spray 2 spray  2 spray Each Nare Daily [name] Pert, [name] [name]   2 spray at 01/14/21 0818  . haloperidol (HALDOL) tablet 5 mg  5 mg Oral BID [name] Pert, [name] [name]   5 mg at 01/14/21 1191  . hydrOXYzine (ATARAX/VISTARIL) tablet 50 mg  50 mg Oral TID PRN [name] Stammer [name], [name] [name]   50 mg at 01/13/21 2005  . LORazepam (ATIVAN) tablet 1 mg  1 mg Oral Q6H PRN [name] [name], [name] [name], [name] [name]   1 mg at 01/13/21 2231   And  . OLANZapine zydis (ZYPREXA) disintegrating tablet 10 mg  10 mg Oral Q8H PRN [name] [name], [name] [name], [name] [name]   10 mg at 01/13/21 2230   And  . ziprasidone (GEODON) injection 20 mg  20 mg Intramuscular Q6H PRN [name] [name], [name] [name], [name] [name]   20 mg at 01/09/21 2004  . magnesium hydroxide (MILK OF MAGNESIA) suspension 30 mL  30 mL Oral Daily PRN [name] [name], [name] [name], [name] [name]   30 mL at 01/10/21 0823  . metoprolol tartrate (LOPRESSOR) tablet 50 mg  50 mg Oral BID [name] Pert, [name] [name]   50 mg at 01/14/21 0817  .  risperiDONE (RISPERDAL M-TABS) disintegrating tablet 4 mg  4 mg Oral BID [name] Pert, [name] [name]   4 mg at 01/14/21 0818  . traZODone (DESYREL) tablet 50 mg  50 mg Oral QHS PRN [name] [name], [name] [name], [name] [name]   50 mg at 01/13/21 2137    Lab Results: No results found for this or any previous visit (from the past 48 hour(s)).  Blood Alcohol level:  Lab Results  Component Value Date  ETH <10 01/07/2021   ETH <10 12/13/2018    Metabolic Disorder Labs: Lab Results  Component Value Date   HGBA1C 5.6 01/08/2021   MPG 114.02 01/08/2021   Lab Results  Component Value Date   PROLACTIN 5.6 01/14/2015   Lab Results  Component Value Date   CHOL 161 01/08/2021   TRIG 95 01/08/2021   HDL 33 ([name]) 01/08/2021   CHOLHDL 4.9 01/08/2021   VLDL 19 01/08/2021   LDLCALC 109 (H) 01/08/2021   LDLCALC 117 (H) 11/01/2019    Physical Findings: AIMS: Facial and Oral Movements Muscles of Facial Expression: None, normal Lips and Perioral Area: None, normal Jaw: None, normal Tongue: None, normal,Extremity Movements Upper (arms, wrists, hands, fingers): None, normal Lower (legs, knees, ankles, toes): None, normal, Trunk Movements Neck, shoulders, hips: None, normal, Overall Severity Severity of abnormal movements (highest score from questions above): None, normal Incapacitation due to abnormal movements: None, normal Patient's awareness of abnormal movements (rate only patient's report): No Awareness, Dental Status Current problems with teeth and/or dentures?: No Does patient usually wear dentures?: No  CIWA:    COWS:     Musculoskeletal: Strength & Muscle Tone: within normal limits Gait & Station: normal Patient leans: N/A  Psychiatric Specialty Exam:  Presentation  General Appearance: Casual  Eye Contact:Fair  Speech:Clear and Coherent; Normal Rate  Speech Volume:Normal  Handedness:Right   Mood and Affect  Mood:Anxious  Affect:Congruent   Thought Process  Thought  Processes:Coherent  Descriptions of Associations:Loose  Orientation:Full (Time, Place and Person)  Thought Content:Delusions; Paranoid Ideation  History of Schizophrenia/Schizoaffective disorder:Yes  Duration of Psychotic Symptoms:Greater than six months  Hallucinations:Hallucinations: Auditory  Ideas of Reference:Delusions; Paranoia  Suicidal Thoughts:Suicidal Thoughts: No  Homicidal Thoughts:Homicidal Thoughts: No   Sensorium  Memory:Immediate Fair; Recent Fair; Remote Fair  Judgment:Fair  Insight:Fair   Executive Functions  Concentration:Fair  Attention Span:Fair  Recall:Fair  Fund of Knowledge:Fair  Language:Fair   Psychomotor Activity  Psychomotor Activity:Psychomotor Activity: Increased   Assets  Assets:Desire for Improvement; Resilience   Sleep  Sleep:Sleep: Good Number of Hours of Sleep: 6    Physical Exam: Physical Exam Vitals and nursing note reviewed.  Constitutional:      Appearance: Normal appearance.  HENT:     Head: Normocephalic and atraumatic.  Pulmonary:     Effort: Pulmonary effort is normal.  Neurological:     General: No focal deficit present.     Mental Status: He is alert and oriented to person, place, and time.    ROS Blood pressure 117/85, pulse (!) 128, temperature 97.8 F (36.6 C), temperature source Oral, resp. rate 20, height 5\' 6"  (1.676 m), weight 101.2 kg, SpO2 99 %. Body mass index is 35.99 kg/m.   Treatment Plan Summary: Daily contact with patient to assess and evaluate symptoms and progress in treatment, Medication management and Plan : Patient is seen and examined.  Patient is a 30 year old male with the above-stated past psychiatric history who is seen in follow-up.   Diagnosis: 1. Schizophrenia; paranoid 2. Hypertension 3.  Tachycardia  Pertinent findings on examination today: 1.  Patient denies auditory hallucinations this morning, but admitted that they were worse last night and cause  disruption. 2.  Mood is stable at this point. 3.  He continues to have some degree of paranoia. 4.  He is significantly tachycardic according to the records.  We will check that. 5.  Blood pressure is stable.  Plan: 1.  Add Haldol 5 mg p.o. twice daily for psychosis. 2.  Continue Cogentin 0.5 mg p.o. every 6 hours as needed tremor. 3.  Continue Cogentin 1 mg IM every 6 hours as needed for acute dystonia. 4.  Continue clonidine 0.1 mg p.o. as needed a systolic blood pressure greater than 150 and a pulse greater than 120. 5.  EKG this AM. 6.  Continue Flonase 2 sprays in each nostril daily for seasonal allergies. 7.  Increase metoprolol to tartrate to 100 mg p.o. twice daily for hypertension and tachycardia. 8.  Continue Zyprexa Zydis agitation protocol. 9.  Continue Risperdal 4 mg p.o. twice daily for psychosis. 10.  Increase trazodone to 100 mg p.o. nightly as needed insomnia. 11.  Patient stated that he is unable to contact his father with regard to returning to his home after discharge.  We will have social work become involved with this.  [name] PertGreg Lawson Michaelle Bottomley, [name] [name] 01/14/2021, 11:52 AM

## 2021-01-14 NOTE — Progress Notes (Signed)
Recreation Therapy Notes  Date: 4.26.22 Time: 1000 Location: 500 Hall Dayroom  Group Topic: Wellness  Goal Area(s) Addresses:  Patient will define components of whole wellness. Patient will verbalize benefit of whole wellness.  Intervention: Exercise  Activity:  Patients participated in a range of exercises.  Each patient had the opportunity to pick stretches and exercises of their choosing to lead the group in.  The goal was to get at least 30 minutes of active movement to loosen the muscles, clear the mind and let go of any frustrations.  After exercising patients were given two worksheets.  One worksheet gave examples of some things patients can do to help with total wellbeing such as eating right, taking a walk, getting proper sleep, etc.  The other sheet taught patients about the different types of exercise (anaerobic and aerobic).  It also left space for patients to plan out when they can exercise and the types of exercise they want to do.    Education:Wellness, Discharge Planning.   Education Outcome: Acknowledges education/In group clarification offered/Needs additional education.   Clinical Observations/Feedback:  Pt did not attend group session.    Pat Sires, LRT/CTRS         Ellionna Buckbee A 01/14/2021 11:22 AM 

## 2021-01-14 NOTE — Progress Notes (Signed)
   01/14/21 0607  Vital Signs  Temp 97.8 F (36.6 C)  Temp Source Oral  Pulse Rate (!) 130  Pulse Rate Source Monitor  BP 140/88  BP Location Right Arm  BP Method Automatic  Patient Position (if appropriate) Sitting  Oxygen Therapy  SpO2 99 %   D: Patient denies SI/HI/AVH. Patient denies anxiety and depression. Pt. Isolated in bed. A:  Patient took scheduled medicine.  Support and encouragement provided Routine safety checks conducted every 15 minutes. Patient  Informed to notify staff with any concerns.   R: Safety maintained.

## 2021-01-14 NOTE — Progress Notes (Signed)
SPIRITUALITY GROUP NOTE  Spirituality group facilitated by Wilkie Aye, MDiv, BCC.  Group Description:  Group focused on topic of hope.  Patients participated in facilitated discussion around topic, connecting with one another around experiences and definitions for hope.  Group members engaged with visual explorer photos, reflecting on what hope looks like for them today.  Group engaged in discussion around how their definitions of hope are present today in hospital.   Modalities: Psycho-social ed, Adlerian, Narrative, MI Patient Progress: Pt was present for group time  Pt spoke about the things in his life that bring him joy and hope which including medicine and music   Leane Para Counseling Intern @ [facility]

## 2021-01-15 MED ORDER — METOPROLOL SUCCINATE ER 100 MG PO TB24
100.0000 mg | ORAL_TABLET | Freq: Every day | ORAL | Status: DC
  Administered 2021-01-16 – 2021-01-17 (×2): 100 mg via ORAL
  Filled 2021-01-15 (×4): qty 1

## 2021-01-15 NOTE — Progress Notes (Signed)
DAR Note: Pt's affect is blunted, mood depressed, however, pt denies SI/HI/AVH, reports that he had a good sleep quality, has a good appetite, and denies any current concerns. All meds being given as ordered. Pt is being maintained on Q15 minute checks for safety.    01/15/21 1053  Psych Admission Type (Psych Patients Only)  Admission Status Voluntary  Psychosocial Assessment  Patient Complaints None  Eye Contact Fair  Facial Expression Flat  Affect Blunted  Speech Logical/coherent;Soft  Interaction Assertive  Motor Activity Slow  Appearance/Hygiene Body odor;In scrubs  Behavior Characteristics Cooperative  Mood Pleasant  Thought Process  Coherency Concrete thinking  Content WDL  Delusions None reported or observed  Perception WDL  Hallucination None reported or observed  Judgment WDL  Confusion None  Danger to Self  Current suicidal ideation? Denies  Self-Injurious Behavior No self-injurious ideation or behavior indicators observed or expressed   Agreement Not to Harm Self Yes  Description of Agreement verbal contract for safety  Danger to Others  Danger to Others None reported or observed

## 2021-01-15 NOTE — Progress Notes (Signed)
Recreation Therapy Notes  Date: 4.27.22 Time: 1000 Location: 500 [address] Dayroom  Group Topic: Self-Esteem  Goal Area(s) Addresses:  Patient will successfully identify positive attributes about themselves.  Patient will successfully identify benefit of improved self-esteem.   Behavioral Response: Engaged  Intervention: Nurse, learning disability; Markers  Activity: The Mask.  LRT and patients discussed the things that influence the way we see ourselves.  LRT brought up the fact that we as people are quick to name the negative things about ourselves and are unable to name the positive.  Some of this comes from the environment around [place] forcing their ideas of beauty and what is acceptable into our subconscious and we take on those thoughts without realizing it.  Patients were given a blank mask and markers.  Patients were to highlight the positive attributes they have that are often hidden behind the mask.  Patients would then share what they were able to bring out about themselves.  Education:  Self-Esteem, Discharge Planning  Education Outcome: Acknowledges education/In group clarification offered/Needs additional education  Clinical Observations/Feedback: Patient was quiet but attentive to peers in group.  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   Caroll Rancher, LRT/CTRS    Caroll Rancher A 01/15/2021 11:33 AM

## 2021-01-15 NOTE — B[facility] Group Notes (Signed)
LCSW Group Therapy Note   01/15/2021 1:15pm   Type of Therapy and Topic:  Group Therapy:  Positive Affirmations   Participation Level:  Did Not Attend  Description of Group: This group addressed positive affirmation toward self and others. Patients went around the room and identified two positive things about themselves and two positive things about a peer in the room. Patients reflected on how it felt to share something positive with others, to identify positive things about themselves, and to hear positive things from others. Patients were encouraged to have a daily reflection of positive characteristics or circumstances.  Therapeutic Goals 1. Patient will verbalize two of their positive qualities 2. Patient will demonstrate empathy for others by stating two positive qualities about a peer in the group 3. Patient will verbalize their feelings when voicing positive self affirmations and when voicing positive affirmations of others 4. Patients will discuss the potential positive impact on their wellness/recovery of focusing on positive traits of self and others.  Summary of Patient Progress: Patient declined to attend.    Therapeutic Modalities Cognitive Behavioral Therapy Motivational Interviewing  Zyara Riling A Chosen Garron, LCSW 01/15/2021 2:18 PM 

## 2021-01-15 NOTE — BHH Counselor (Signed)
CSW met with this patient and allowed him access to his phone in order to contact his father.  Pt was unable to contact father, however was able to leave a message. [name] [name] states that his father has a trailer at his house that he is able to leave his belongings at while he is at work. Pt is confident he is able to stay with his father and states he typically will show up at his fathers house when he needs to and is allowed to stay.     Darletta Moll MSW, LCSW Clincal Social Worker  [facility]

## 2021-01-15 NOTE — Progress Notes (Signed)
[facility] MD Progress Note  01/15/2021 10:46 AM [name] [name]  MRN:  [id number] Subjective:  Patient is a 30 year old male with a reported past psychiatric history significant for schizophrenia who presented to the [facility] emergency department on 01/07/2021 with plans to jump off a bridge, having auditory hallucinations as well as paranoia. He stated that his suicidality had been stimulated because he had been homeless for approximately a week. He gave up his apartment, and had been living with his brother. He does not get along with his brother and reported daily arguments.  Objective: Patient is seen and examined.  Patient is a 30 year old male with the above-stated past psychiatric history who is seen in follow-up.  He continues to slowly improve.  He denied any problems with auditory or visual hallucinations overnight.  He denied any side effects to his current medications.  Social work and the patient attempted to speak with his father with regard to disposition.  The patient was unable to contact his father.  He was able to leave a message.  The patient stated that the father was supposedly going to allow him to leave his stuff to "his place", until he can find a place to stay on his own.  He is requesting discharge today, and I told him that we needed to clarify disposition before we went and took that step.  His tachycardia continues.  The heart rate was between 105 and 135.  His EKG from yesterday showed a normal sinus rhythm and a normal QTc interval.  Blood pressures controlled.  Pulse oximetry on room air is 96%.  No new laboratories.  He slept 7.5 hours last night.  Principal Problem: MDD (major depressive disorder), recurrent, severe, with psychosis (HCC) Diagnosis: Principal Problem:   MDD (major depressive disorder), recurrent, severe, with psychosis (HCC) Active Problems:   Substance-induced psychotic disorder with delusions (HCC)   Undifferentiated schizophrenia  (HCC)  Total Time spent with patient: 20 minutes  Past Psychiatric History: See admission H&P  Past Medical History:  Past Medical History:  Diagnosis Date  . Schizophrenia (HCC)   . Tibial plateau fracture, left     Past Surgical History:  Procedure Laterality Date  . APPENDECTOMY    . LAPAROSCOPIC APPENDECTOMY N/A 07/16/2017   Procedure: APPENDECTOMY LAPAROSCOPIC;  Surgeon: Axel Filler, MD;  Location: [facility] OR;  Service: General;  Laterality: N/A;  . LAPAROTOMY N/A 07/16/2017   Procedure: POSSIBLE OPEN  LAPAROTOMY;  Surgeon: Axel Filler, MD;  Location: [facility] OR;  Service: General;  Laterality: N/A;  . NO PAST SURGERIES    . ORIF TIBIA PLATEAU Left 04/15/2018   Procedure: OPEN REDUCTION INTERNAL FIXATION (ORIF) LEFT BICONDYLAR TIBIAL PLATEAU;  Surgeon: Tarry Kos, MD;  Location: [facility] OR;  Service: Orthopedics;  Laterality: Left;   Family History:  Family History  Problem Relation Age of Onset  . Other Mother   . Throat cancer Mother   . Other Brother    Family Psychiatric  History: See admission H&P Social History:  Social History   Substance and Sexual Activity  Alcohol Use Not Currently     Social History   Substance and Sexual Activity  Drug Use Not Currently  . Types: Marijuana   Comment: denies use of marijuana     Social History   Socioeconomic History  . Marital status: Single    Spouse name: Not on file  . Number of children: Not on file  . Years of education: Not on file  .  Highest education level: Not on file  Occupational History  . Not on file  Tobacco Use  . Smoking status: Current Some Day Smoker    Packs/day: 0.25    Years: 12.00    Pack years: 3.00    Types: Cigarettes  . Smokeless tobacco: Never Used  Vaping Use  . Vaping Use: Never used  Substance and Sexual Activity  . Alcohol use: Not Currently  . Drug use: Not Currently    Types: Marijuana    Comment: denies use of marijuana   . Sexual activity: Not Currently  Other Topics  Concern  . Not on file  Social History Narrative  . Not on file   Social Determinants of Health   Financial Resource Strain: Not on file  Food Insecurity: Not on file  Transportation Needs: Not on file  Physical Activity: Not on file  Stress: Not on file  Social Connections: Not on file   Additional Social History:                         Sleep: Good  Appetite:  Good  Current Medications: Current Facility-Administered Medications  Medication Dose Route Frequency Provider Last Rate Last Admin  . acetaminophen (TYLENOL) tablet 650 mg  650 mg Oral Q6H PRN White, Patrice L, NP   650 mg at 01/10/21 2200  . alum & mag hydroxide-simeth (MAALOX/MYLANTA) 200-200-20 MG/5ML suspension 30 mL  30 mL Oral Q4H PRN White, Patrice L, NP      . benztropine (COGENTIN) tablet 0.5 mg  0.5 mg Oral Q6H PRN Claudie Revering, MD      . benztropine mesylate (COGENTIN) injection 1 mg  1 mg Intramuscular Q6H PRN Claudie Revering, MD      . cloNIDine (CATAPRES) tablet 0.1 mg  0.1 mg Oral TID PRN Antonieta Pert, MD   0.1 mg at 01/14/21 0737  . fluticasone (FLONASE) 50 MCG/ACT nasal spray 2 spray  2 spray Each Nare Daily Antonieta Pert, MD   2 spray at 01/15/21 0141  . haloperidol (HALDOL) tablet 5 mg  5 mg Oral BID Antonieta Pert, MD   5 mg at 01/15/21 1062  . hydrOXYzine (ATARAX/VISTARIL) tablet 50 mg  50 mg Oral TID PRN Armandina Stammer I, NP   50 mg at 01/14/21 2018  . LORazepam (ATIVAN) tablet 1 mg  1 mg Oral Q6H PRN White, Patrice L, NP   1 mg at 01/13/21 2231   And  . OLANZapine zydis (ZYPREXA) disintegrating tablet 10 mg  10 mg Oral Q8H PRN White, Patrice L, NP   10 mg at 01/13/21 2230   And  . ziprasidone (GEODON) injection 20 mg  20 mg Intramuscular Q6H PRN White, Patrice L, NP   20 mg at 01/09/21 2004  . magnesium hydroxide (MILK OF MAGNESIA) suspension 30 mL  30 mL Oral Daily PRN White, Patrice L, NP   30 mL at 01/15/21 0827  . metoprolol tartrate (LOPRESSOR) tablet 100 mg  100 mg  Oral BID Antonieta Pert, MD   100 mg at 01/15/21 0824  . risperiDONE (RISPERDAL M-TABS) disintegrating tablet 4 mg  4 mg Oral BID Antonieta Pert, MD   4 mg at 01/15/21 0824  . traZODone (DESYREL) tablet 100 mg  100 mg Oral QHS PRN Antonieta Pert, MD   100 mg at 01/14/21 2018    Lab Results: No results found for this or any previous visit (from the past  48 hour(s)).  Blood Alcohol level:  Lab Results  Component Value Date   ETH <10 01/07/2021   ETH <10 12/13/2018    Metabolic Disorder Labs: Lab Results  Component Value Date   HGBA1C 5.6 01/08/2021   MPG 114.02 01/08/2021   Lab Results  Component Value Date   PROLACTIN 5.6 01/14/2015   Lab Results  Component Value Date   CHOL 161 01/08/2021   TRIG 95 01/08/2021   HDL 33 (L) 01/08/2021   CHOLHDL 4.9 01/08/2021   VLDL 19 01/08/2021   LDLCALC 109 (H) 01/08/2021   LDLCALC 117 (H) 11/01/2019    Physical Findings: AIMS: Facial and Oral Movements Muscles of Facial Expression: None, normal Lips and Perioral Area: None, normal Jaw: None, normal Tongue: None, normal,Extremity Movements Upper (arms, wrists, hands, fingers): None, normal Lower (legs, knees, ankles, toes): None, normal, Trunk Movements Neck, shoulders, hips: None, normal, Overall Severity Severity of abnormal movements (highest score from questions above): None, normal Incapacitation due to abnormal movements: None, normal Patient's awareness of abnormal movements (rate only patient's report): No Awareness, Dental Status Current problems with teeth and/or dentures?: No Does patient usually wear dentures?: No  CIWA:    COWS:     Musculoskeletal: Strength & Muscle Tone: within normal limits Gait & Station: normal Patient leans: N/A  Psychiatric Specialty Exam:  Presentation  General Appearance: Appropriate for Environment  Eye Contact:Fair  Speech:Normal Rate  Speech Volume:Decreased  Handedness:Right   Mood and Affect   Mood:Euthymic  Affect:Flat   Thought Process  Thought Processes:Coherent  Descriptions of Associations:Circumstantial  Orientation:Full (Time, Place and Person)  Thought Content:Delusions; Paranoid Ideation  History of Schizophrenia/Schizoaffective disorder:Yes  Duration of Psychotic Symptoms:Greater than six months  Hallucinations:Hallucinations: Auditory  Ideas of Reference:Delusions; Paranoia  Suicidal Thoughts:Suicidal Thoughts: No  Homicidal Thoughts:Homicidal Thoughts: No   Sensorium  Memory:Immediate Fair; Recent Fair; Remote Fair  Judgment:Fair  Insight:Fair   Executive Functions  Concentration:Fair  Attention Span:Fair  Recall:Fair  Fund of Knowledge:Fair  Language:Fair   Psychomotor Activity  Psychomotor Activity:Psychomotor Activity: Normal   Assets  Assets:Desire for Improvement; Resilience; Social Support   Sleep  Sleep:Sleep: Good Number of Hours of Sleep: 7.5    Physical Exam: Physical Exam Vitals and nursing note reviewed.  Constitutional:      Appearance: Normal appearance.  HENT:     Head: Normocephalic and atraumatic.  Pulmonary:     Effort: Pulmonary effort is normal.  Neurological:     General: No focal deficit present.     Mental Status: He is alert and oriented to person, place, and time.    ROS Blood pressure 129/73, pulse (!) 135, temperature 98.4 F (36.9 C), temperature source Oral, resp. rate 20, height 5\' 6"  (1.676 m), weight 101.2 kg, SpO2 96 %. Body mass index is 35.99 kg/m.   Treatment Plan Summary: Daily contact with patient to assess and evaluate symptoms and progress in treatment, Medication management and Plan : Patient is seen and examined.  Patient is a 30 year old male with the above-stated past psychiatric history is seen in follow-up.   Diagnosis: 1. Schizophrenia; paranoid 2. Hypertension 3.  Tachycardia  Pertinent findings on examination today: 1.  Patient denied any complicating  auditory or visual hallucinations last night or this morning. 2.  Patient denies suicidal or homicidal ideation. 3.  Attempts to contact his father with regard to housing have not been so successful at this point. 4.  Blood pressure still remains mildly elevated and tachycardic.  Plan: 1.  Continue  Cogentin 0.5 mg p.o. every 6 hours as needed tremors. 2.  Continue Cogentin 1 mg IM every 6 hours as needed acute dystonia. 3.  Continue clonidine Catapres 0.1 mg p.o. 3 times daily for systolic blood pressure greater than 150. 4.  Continue Flonase 2 sprays in each nostril daily. 5.  Continue haloperidol 5 mg p.o. twice daily for psychosis and mood stability. 6.  Change metoprolol to tartrate to metoprolol succinate for better minutes minute control and long-acting drug. 7.  Continue Zyprexa Zydis as needed agitation protocol. 8.  Continue Risperdal 4 mg p.o. twice daily for psychosis. 9.  Continue trazodone 100 mg p.o. nightly as needed insomnia. 10.  Disposition planning-as soon as there is clarification with regard to where the patient will go after discharge we will plan discharge in the next 2 to 3 days.  Antonieta Pert, MD 01/15/2021, 10:46 AM

## 2021-01-16 ENCOUNTER — Other Ambulatory Visit (HOSPITAL_COMMUNITY): Payer: Self-pay

## 2021-01-16 MED ORDER — RISPERIDONE 2 MG PO TBDP
2.0000 mg | ORAL_TABLET | Freq: Two times a day (BID) | ORAL | Status: DC
  Filled 2021-01-16 (×3): qty 1

## 2021-01-16 MED ORDER — BENZTROPINE MESYLATE 0.5 MG PO TABS
0.5000 mg | ORAL_TABLET | Freq: Two times a day (BID) | ORAL | Status: DC
  Administered 2021-01-16 – 2021-01-17 (×2): 0.5 mg via ORAL
  Filled 2021-01-16 (×5): qty 1

## 2021-01-16 MED ORDER — RISPERIDONE 2 MG PO TBDP
3.0000 mg | ORAL_TABLET | Freq: Two times a day (BID) | ORAL | Status: DC
  Administered 2021-01-16 – 2021-01-17 (×2): 3 mg via ORAL
  Filled 2021-01-16 (×4): qty 1

## 2021-01-16 NOTE — Plan of Care (Signed)
  Problem: Education: Goal: Emotional status will improve Outcome: Progressing   Problem: Activity: Goal: Interest or engagement in activities will improve Outcome: Not Progressing   

## 2021-01-16 NOTE — B[facility] Counselor (Signed)
CSW provided this patient with homelessness resources.     Leidi Astle MSW, LCSW Clincal Social Worker  [facility]  

## 2021-01-16 NOTE — Progress Notes (Signed)
[facility] MD Progress Note  01/16/2021 5:23 PM [name] [name]  MRN:  [id number]   Reason for admission:  Patient is a 30 year old male with a reported past psychiatric history significant for schizophrenia who presented to the [facility] emergency department on 01/07/2021 with plans to jump off a bridge, having auditory hallucinations as well as paranoia. He stated that his suicidality had been stimulated because he had been homeless for approximately a week. He gave up his apartment, and had been living with his brother. He does not get along with his brother and reported daily arguments.  Subjective: Patient states that he is doing well, feels callmer and hopes to go home soon.  Objective: Medical record reviewed.  Patient's case discussed in detail with members of the treatment team.  I met with and evaluated on the unit today.  Patient states that he feels his current medications have helped him.  He reports feeling calmer since taking risperidone.  The patient notes nasal congestion this morning when he first woke up.  Congestion resolved with use of Flonase and passage of time.  Patient states that he has a history of seasonal allergies.  He denies muscle stiffness, tremors, trouble breathing, trouble swallowing or other medication side effects.  The patient denies passive wish for death, SI, AI or HI.  He reports he continues to intermittently experience hearing voices that tell him to do things such as "do not eat food" but he denies dangerous command hallucinations and states he is better able to ignore the voices.  He reports feeling intermittently paranoid and closed-end in the hospital.  He is eager for discharge and wants to be able to work so he can pay his bills.  Patient slept 6.5 hours last night.  He has been visible in the day room interacting with peers and has been attending groups.  The patient has been taking standing dose medications as prescribed.  He has not  engaged in any aggressive behavior or self-injurious behavior on the unit.  He continues to be intermittently hypertensive but heart rate is no longer elevated.  Most recent vital signs this morning were BP of 117/91, pulse of 91, respirations of 18 and O2 sat of 100%.  He is afebrile.  Principal Problem: Undifferentiated schizophrenia ([place]) Diagnosis: Principal Problem:   Undifferentiated schizophrenia ([place]) Active Problems:   Substance-induced psychotic disorder with delusions ([place])   MDD (major depressive disorder), recurrent, severe, with psychosis ([place])  Total Time spent with patient: 25 minutes  Past Psychiatric History: See admission H&P  Past Medical History:  Past Medical History:  Diagnosis Date  . Schizophrenia ([place])   . Tibial plateau fracture, left     Past Surgical History:  Procedure Laterality Date  . APPENDECTOMY    . LAPAROSCOPIC APPENDECTOMY N/A 07/16/2017   Procedure: APPENDECTOMY LAPAROSCOPIC;  Surgeon: Ralene Ok, MD;  Location: [place];  Service: General;  Laterality: N/A;  . LAPAROTOMY N/A 07/16/2017   Procedure: POSSIBLE OPEN  LAPAROTOMY;  Surgeon: Ralene Ok, MD;  Location: [place];  Service: General;  Laterality: N/A;  . NO PAST SURGERIES    . ORIF TIBIA PLATEAU Left 04/15/2018   Procedure: OPEN REDUCTION INTERNAL FIXATION (ORIF) LEFT BICONDYLAR TIBIAL PLATEAU;  Surgeon: Leandrew Koyanagi, MD;  Location: [place];  Service: Orthopedics;  Laterality: Left;   Family History:  Family History  Problem Relation Age of Onset  . Other Mother   . Throat cancer Mother   . Other Brother  Family Psychiatric  History: See admission H&P Social History:  Social History   Substance and Sexual Activity  Alcohol Use Not Currently     Social History   Substance and Sexual Activity  Drug Use Not Currently  . Types: Marijuana   Comment: denies use of marijuana     Social History   Socioeconomic History  . Marital status: Single    Spouse name: Not on  file  . Number of children: Not on file  . Years of education: Not on file  . Highest education level: Not on file  Occupational History  . Not on file  Tobacco Use  . Smoking status: Current Some Day Smoker    Packs/day: 0.25    Years: 12.00    Pack years: 3.00    Types: Cigarettes  . Smokeless tobacco: Never Used  Vaping Use  . Vaping Use: Never used  Substance and Sexual Activity  . Alcohol use: Not Currently  . Drug use: Not Currently    Types: Marijuana    Comment: denies use of marijuana   . Sexual activity: Not Currently  Other Topics Concern  . Not on file  Social History Narrative  . Not on file   Social Determinants of Health   Financial Resource Strain: Not on file  Food Insecurity: Not on file  Transportation Needs: Not on file  Physical Activity: Not on file  Stress: Not on file  Social Connections: Not on file   Additional Social History:                         Sleep: Good  Appetite:  Good  Current Medications: Current Facility-Administered Medications  Medication Dose Route Frequency Provider Last Rate Last Admin  . acetaminophen (TYLENOL) tablet 650 mg  650 mg Oral Q6H PRN White, Patrice L, NP   650 mg at 01/15/21 1305  . alum & mag hydroxide-simeth (MAALOX/MYLANTA) 200-200-20 MG/5ML suspension 30 mL  30 mL Oral Q4H PRN White, Patrice L, NP      . benztropine (COGENTIN) tablet 0.5 mg  0.5 mg Oral Q6H PRN Arthor Captain, MD      . benztropine (COGENTIN) tablet 0.5 mg  0.5 mg Oral BID Arthor Captain, MD      . benztropine mesylate (COGENTIN) injection 1 mg  1 mg Intramuscular Q6H PRN Arthor Captain, MD      . cloNIDine (CATAPRES) tablet 0.1 mg  0.1 mg Oral TID PRN Sharma Covert, MD   0.1 mg at 01/14/21 2956  . fluticasone (FLONASE) 50 MCG/ACT nasal spray 2 spray  2 spray Each Nare Daily Sharma Covert, MD   2 spray at 01/16/21 2130  . haloperidol (HALDOL) tablet 5 mg  5 mg Oral BID Sharma Covert, MD   5 mg at 01/16/21 8657   . hydrOXYzine (ATARAX/VISTARIL) tablet 50 mg  50 mg Oral TID PRN Lindell Spar I, NP   50 mg at 01/16/21 1252  . LORazepam (ATIVAN) tablet 1 mg  1 mg Oral Q6H PRN White, Patrice L, NP   1 mg at 01/16/21 1251   And  . OLANZapine zydis (ZYPREXA) disintegrating tablet 10 mg  10 mg Oral Q8H PRN White, Patrice L, NP   10 mg at 01/13/21 2230   And  . ziprasidone (GEODON) injection 20 mg  20 mg Intramuscular Q6H PRN White, Patrice L, NP   20 mg at 01/09/21 2004  . magnesium hydroxide (MILK  OF MAGNESIA) suspension 30 mL  30 mL Oral Daily PRN White, Patrice L, NP   30 mL at 01/15/21 0827  . metoprolol succinate (TOPROL-XL) 24 hr tablet 100 mg  100 mg Oral Daily Sharma Covert, MD   100 mg at 01/16/21 0806  . risperiDONE (RISPERDAL M-TABS) disintegrating tablet 3 mg  3 mg Oral BID Arthor Captain, MD      . traZODone (DESYREL) tablet 100 mg  100 mg Oral QHS PRN Sharma Covert, MD   100 mg at 01/14/21 2018    Lab Results: No results found for this or any previous visit (from the past 19 hour(s)).  Blood Alcohol level:  Lab Results  Component Value Date   ETH <10 01/07/2021   ETH <10 50/56/9794    Metabolic Disorder Labs: Lab Results  Component Value Date   HGBA1C 5.6 01/08/2021   MPG 114.02 01/08/2021   Lab Results  Component Value Date   PROLACTIN 5.6 01/14/2015   Lab Results  Component Value Date   CHOL 161 01/08/2021   TRIG 95 01/08/2021   HDL 33 (L) 01/08/2021   CHOLHDL 4.9 01/08/2021   VLDL 19 01/08/2021   LDLCALC 109 (H) 01/08/2021   LDLCALC 117 (H) 11/01/2019    Physical Findings: AIMS: Facial and Oral Movements Muscles of Facial Expression: None, normal Lips and Perioral Area: None, normal Jaw: None, normal Tongue: None, normal,Extremity Movements Upper (arms, wrists, hands, fingers): None, normal Lower (legs, knees, ankles, toes): None, normal, Trunk Movements Neck, shoulders, hips: None, normal, Overall Severity Severity of abnormal movements (highest score  from questions above): None, normal Incapacitation due to abnormal movements: None, normal Patient's awareness of abnormal movements (rate only patient's report): No Awareness, Dental Status Current problems with teeth and/or dentures?: No Does patient usually wear dentures?: No  CIWA:    COWS:     Musculoskeletal: Strength & Muscle Tone: within normal limits Gait & Station: normal Patient leans: N/A  Psychiatric Specialty Exam:  Presentation  General Appearance: Appropriate for Environment  Eye Contact:Good  Speech:Normal Rate  Speech Volume:Normal  Handedness:Right   Mood and Affect  Mood:Anxious  Affect:Flat   Thought Process  Thought Processes:Coherent; Goal Directed  Descriptions of Associations:Intact  Orientation:Full (Time, Place and Person)  Thought Content:Paranoid Ideation  History of Schizophrenia/Schizoaffective disorder:Yes  Duration of Psychotic Symptoms:Greater than six months  Hallucinations:Hallucinations: Auditory  Ideas of Reference:Paranoia  Suicidal Thoughts:Suicidal Thoughts: No  Homicidal Thoughts:Homicidal Thoughts: No   Sensorium  Memory:Immediate Fair; Recent Fair; Remote Fair  Judgment:Fair  Insight:Fair   Executive Functions  Concentration:Fair  Attention Span:Fair  [place]  Language:Good   Psychomotor Activity  Psychomotor Activity:Psychomotor Activity: Normal   Assets  Assets:Desire for Improvement; Armed forces logistics/support/administrative officer; Resilience; Social Support   Sleep  Sleep:Sleep: Good Number of Hours of Sleep: 6.5    Physical Exam: Physical Exam Vitals and nursing note reviewed.  HENT:     Head: Normocephalic and atraumatic.  Neurological:     General: No focal deficit present.     Mental Status: He is alert and oriented to person, place, and time.    Review of Systems  Constitutional: Negative for diaphoresis and fever.  HENT: Positive for congestion.        Positive  for nasal congestion this morning  Eyes: Negative for blurred vision.  Respiratory: Negative for cough and shortness of breath.   Cardiovascular: Negative for chest pain and palpitations.  Gastrointestinal: Negative for constipation, diarrhea, nausea and vomiting.  Genitourinary: Negative for dysuria.  Musculoskeletal: Negative for back pain, myalgias and neck pain.  Skin: Negative.   Neurological: Negative for dizziness, tremors and headaches.  Psychiatric/Behavioral: Positive for hallucinations. Negative for depression and suicidal ideas. The patient does not have insomnia.    Blood pressure (!) 117/91, pulse 91, temperature 98 F (36.7 C), temperature source Oral, resp. rate 18, height $RemoveBe'5\' 6"'zBlHAduKh$  (1.676 m), weight 101.2 kg, SpO2 100 %. Body mass index is 35.99 kg/m.   Treatment Plan Summary: Daily contact with patient to assess and evaluate symptoms and progress in treatment, Medication management and Plan :  Diagnosis: 1. Schizophrenia; paranoid 2. Hypertension 3.  Tachycardia  Plan: Schizophrenia -Decrease Risperdal to 3 mg PO daily for psychosis and mood stability -Continue haloperidol 5 mg twice daily for psychosis and mood stability -Start standing dose benztropine 0.5 mg twice daily for EPS - Continue Cogentin 0.5 mg p.o. every 6 hours as needed tremors. - Continue Cogentin 1 mg IM every 6 hours as needed acute dystonia.  Nasal congestion - Continue Flonase 2 sprays in each nostril daily  Hypertension -Continue metoprolol succinate 100 mg daily for hypertension and tachycardia  -Continue clonidine Catapres 0.1 mg p.o. 3 times daily for systolic blood pressure greater than 150  Insomnia - Continue trazodone 100 mg p.o. nightly as needed insomnia.  Disposition and planning is in progress.  Anticipate probable discharge within the next 24 to 48 hours.  Arthor Captain, MD 01/16/2021, 5:23 PM

## 2021-01-16 NOTE — [facility] Group Notes (Signed)
Occupational Therapy Group Note Date: 01/16/2021 Group Topic/Focus: Feelings Management  Group Description: Group encouraged increased engagement and participation through discussion focused on Building Happiness. Patients were encouraged to engage in discussion and share what happiness means to them and what brings them true feelings of happiness. Patients were also provided with a handout that offered six strategies to build/improve happiness including identifying gratitude, acts of kindness, exercise, meditation, fostering relationships, and positive journaling.  Participation Level: Active   Participation Quality: Independent   Behavior: Guarded and Interactive   Speech/Thought Process: Focused   Affect/Mood: Constricted   Insight: Limited   Judgement: Limited   Individualization: [name] [name] was active in their participation of group discussion/activity. He shared that happiness to him is "my friends". Receptive and attentive to strategies reviewed.   Modes of Intervention: Discussion, Education and Support  Patient Response to Interventions:  Attentive   Plan: Continue to engage patient in OT groups 2 - 3x/week.  01/16/2021  Donne Hazel, MOT, OTR/L

## 2021-01-16 NOTE — Progress Notes (Signed)
   01/15/21 2020  Psych Admission Type (Psych Patients Only)  Admission Status Voluntary  Psychosocial Assessment  Patient Complaints None  Eye Contact Fair  Facial Expression Flat  Affect Blunted  Speech Logical/coherent;Soft  Interaction Assertive  Motor Activity Other (Comment) (WDL)  Appearance/Hygiene Body odor;In scrubs  Behavior Characteristics Cooperative;Appropriate to situation  Mood Pleasant  Thought Process  Coherency Concrete thinking  Content WDL  Delusions None reported or observed  Perception WDL  Hallucination None reported or observed  Judgment WDL  Confusion None  Danger to Self  Current suicidal ideation? Denies  Danger to Others  Danger to Others None reported or observed  [name] [name] has been in the bed much of the shift.  He was pleasant and cooperative.  He reported that "I'm just tired."  Encouraged participation in unit activities.  Q 15 minute checks maintained for safety.  We will continue to monitor the progress towards his goals.

## 2021-01-16 NOTE — Progress Notes (Signed)
Recreation Therapy Notes  Date: 4.28.22 Time: 1000 Location: 500 Hall Dayroom  Group Topic: Communication, Team Building, Problem Solving  Goal Area(s) Addresses:  Patient will effectively work with peer towards shared goal.  Patient will identify skills used to make activity successful.  Patient will identify how skills used during activity can be applied to reach post d/c goals.   Behavioral Response: Engaged  Intervention: STEM Activity- Glass blower/designer  Activity: Tallest [employer]. In teams of 5-6, patients were given 12 craft pipe cleaners. Using the materials provided, patients were instructed to compete again the opposing team(s) to build the tallest free-standing structure from floor level. The activity was timed; difficulty incrementally increased by Clinical research associate as Production designer, theatre/television/film continued. Additional directions given including, placing one arm behind their back, working in silence, and shape stipulations. LRT facilitated post-activity discussion reviewing team processes and necessary communication skills involved in completion. Patients were encouraged to reflect how the skills utilized, or not utilized, in this activity can be incorporated to positively impact support systems post discharge.  Education: Pharmacist, community, Scientist, physiological, Discharge Planning   Education Outcome: Acknowledges education/In group clarification offered/Needs additional education.   Clinical Observations/Feedback: Pt was attentive and mostly followed the lead of the peers pt was grouped with.  Pt did have a few moments were pt took the lead.  Pt left for a while but then returned.  Pt sat quietly during processing and appeared to be attentive.      Caroll Rancher, LRT/CTRS    Caroll Rancher A 01/16/2021 10:37 AM

## 2021-01-16 NOTE — Progress Notes (Signed)
Patient ID: [name] [name], male   DOB: [birth year], 30 y.o.   MRN: [id number] Patient complaining of anxiety at 1251, stating that his mind was racing and was anxious that he didn't get to go home today. Pt was medicated with 1mg  Ativan at that time and was calm for the rest of the day.

## 2021-01-16 NOTE — [facility] Group Notes (Signed)
[facility] Group Notes:  (Nursing/MHT/Case Management/Adjunct)  Date:  01/16/2021  Time:  9:45 AM  Type of Therapy:  Group Therapy  Participation Level:  Active  Participation Quality:  Appropriate  Affect:  Appropriate  Cognitive:  Appropriate  Insight:  Good and Improving  Engagement in Group:  Engaged and Improving  Modes of Intervention:  Orientation  Summary of Progress/Problems: His goal is to be able to remain positive and find out when he is getting discharged.   Makyra Corprew J Winta Barcelo 01/16/2021, 9:45 AM

## 2021-01-16 NOTE — Progress Notes (Signed)
DAR Note: Patient denies SI/HI/AVH, visible in the day room interacting with peers, affect remains blunted and mood is depressed, pt is guarded and does not engage in conversations unless approached. Pt reports a good appetite, a good sleep quality last night, and only concerns today was congestion, stated that he could not breath, but V/S were WNL, with O2 sats at 100% on RA. Dr Fayrene Fearing was present and evaluated pt. Pt is being maintained on Q15 minute checks for safety, all meds being given as ordered. Pt reports that what is most important for him today is getting discharged, and that his goal for today is "being positive". Pt is being monitored on Q15 minute checks for safety.   01/16/21 1140  Psych Admission Type (Psych Patients Only)  Admission Status Voluntary  Psychosocial Assessment  Patient Complaints None  Eye Contact Fair  Facial Expression Flat  Affect Blunted  Speech Logical/coherent;Soft  Interaction Assertive  Motor Activity Other (Comment) (WDL)  Appearance/Hygiene Body odor;In scrubs  Behavior Characteristics Cooperative  Mood Pleasant  Thought Process  Coherency Concrete thinking  Content WDL  Delusions None reported or observed  Perception WDL  Hallucination None reported or observed  Judgment WDL  Confusion None  Danger to Self  Current suicidal ideation? Denies  Self-Injurious Behavior No self-injurious ideation or behavior indicators observed or expressed   Agreement Not to Harm Self Yes  Description of Agreement verbal contract for safety  Danger to Others  Danger to Others None reported or observed

## 2021-01-17 ENCOUNTER — Other Ambulatory Visit: Payer: Self-pay

## 2021-01-17 MED ORDER — FLUTICASONE PROPIONATE 50 MCG/ACT NA SUSP: 2.0000 | Freq: Every day | NASAL | 2 refills | Status: DC

## 2021-01-17 MED ORDER — METOPROLOL SUCCINATE ER 100 MG PO TB24
100.0000 mg | ORAL_TABLET | Freq: Every day | ORAL | 0 refills | Status: DC
  Filled 2021-01-17: qty 30, 30d supply, fill #0

## 2021-01-17 MED ORDER — RISPERIDONE 3 MG PO TBDP
3.0000 mg | ORAL_TABLET | Freq: Two times a day (BID) | ORAL | 0 refills | Status: DC
  Filled 2021-01-17: qty 56, 28d supply, fill #0

## 2021-01-17 MED ORDER — HALOPERIDOL 5 MG PO TABS
5.0000 mg | ORAL_TABLET | Freq: Two times a day (BID) | ORAL | 0 refills | Status: DC
  Filled 2021-01-17: qty 60, 30d supply, fill #0

## 2021-01-17 MED ORDER — BENZTROPINE MESYLATE 0.5 MG PO TABS
0.5000 mg | ORAL_TABLET | Freq: Two times a day (BID) | ORAL | 0 refills | Status: DC
  Filled 2021-01-17: qty 60, 30d supply, fill #0

## 2021-01-17 MED ORDER — HYDROXYZINE HCL 50 MG PO TABS
50.0000 mg | ORAL_TABLET | Freq: Three times a day (TID) | ORAL | 0 refills | Status: DC | PRN
  Filled 2021-01-17: qty 75, 25d supply, fill #0

## 2021-01-17 MED ORDER — TRAZODONE HCL 100 MG PO TABS
100.0000 mg | ORAL_TABLET | Freq: Every evening | ORAL | 0 refills | Status: DC | PRN
  Filled 2021-01-17: qty 30, 30d supply, fill #0

## 2021-01-17 NOTE — Progress Notes (Signed)
Recreation Therapy Notes  Date: 4.29.22 Time: 1000 Location: 500 Hall Dayroom  Group Topic: Decision Making, Problem Solving, Communication  Goal Area(s) Addresses:  Patient will effectively work with peer towards shared goal.  Patient will identify factors that guided their decision making.  Patient will pro-socially communicate ideas during group session.   Behavioral Response: Engaged  Intervention: Survival Scenario - pencil, paper  Activity:  Patients were given a scenario that they were going to be stranded on a deserted [place] for several months before being rescued. Writer tasked them with making a list of 15 things they would choose to bring with them for "survival". The list of items was prioritized most important to least. Each patient would come up with their own list, then work together to create a new list of 15 items while in a group of 3-5 peers. LRT discussed each person's list and how it differed from others. The debrief included discussion of priorities, good decisions versus bad decisions, and how it is important to think before acting so we can make the best decision possible. LRT tied the concept of effective communication among group members to patient's support systems outside of the hospital and its benefit post discharge.  Education: Pharmacist, community, Priorities, Support System, Discharge Planning    Education Outcome:   Acknowledges education/In group clarification/Needs additional education  Clinical Observations/Feedback: Pt was smiling during group session.  Pt was bright and worked well with partner.  Pt identified some of the things on his list as his brain, knife, hiking supplies, boots and tent supplies.  Pt also identified a negative decision as kicking out a window which is what got him here.  Pt identified the positive decision as coming to the hospital to clear his mind and get back on track.   Caroll Rancher, LRT/CTRS    Caroll Rancher  A 01/17/2021 11:49 AM

## 2021-01-17 NOTE — [facility] Group Notes (Signed)
[facility] Group Notes:  (Nursing/MHT/Case Management/Adjunct)  Date:  01/17/2021  Time:  12:05 PM  Type of Therapy:  Group Therapy  Participation Level:  Active  Participation Quality:  Appropriate  Affect:  Appropriate  Cognitive:  Alert and Appropriate  Insight:  Appropriate, Good and Improving  Engagement in Group:  Engaged  Modes of Intervention:  Orientation  Summary of Progress/Problems: His goal today is to get discharged.   Coralynn Gaona J Deionte Spivack 01/17/2021, 12:05 PM

## 2021-01-17 NOTE — Progress Notes (Signed)
   01/16/21 2128  Psych Admission Type (Psych Patients Only)  Admission Status Voluntary  Psychosocial Assessment  Patient Complaints None  Eye Contact Fair  Facial Expression Flat  Affect Flat  Speech Logical/coherent  Interaction Assertive  Motor Activity Other (Comment) (WDL)  Appearance/Hygiene Body odor;In scrubs  Behavior Characteristics Cooperative;Appropriate to situation  Mood Pleasant  Thought Process  Coherency Concrete thinking  Content WDL  Delusions None reported or observed  Perception WDL  Hallucination None reported or observed  Judgment WDL  Confusion None  Danger to Self  Current suicidal ideation? Denies  Danger to Others  Danger to Others None reported or observed  [name] [name] has been in his room much of the evening.  He did come out to take prn Trazodone to sleep and is currently resting with his eyes closed and appears to be asleep.  He denied SI/HI or A/V hallucinations at this time.  He reports that he is feeling better and is ready to go home.  He denied any pain or discomfort and appears to be in no physical distress.  Q 15 minute checks maintained for safety.  We will continue to monitor the progress towards his goals.

## 2021-01-17 NOTE — Progress Notes (Signed)
Recreation Therapy Notes  INPATIENT RECREATION TR PLAN  Patient Details Name: [name] [name] MRN: [id number] DOB: December 20, 1990 Today's Date: 01/17/2021  Rec Therapy Plan Is patient appropriate for Therapeutic Recreation?: Yes Treatment times per week: about 3 days Estimated Length of Stay: 5-7 days TR Treatment/Interventions: Group participation (Comment)  Discharge Criteria Pt will be discharged from therapy if:: Discharged Treatment plan/goals/alternatives discussed and agreed upon by:: Patient/family  Discharge Summary Short term goals set: See patient care plan Short term goals met: Adequate for discharge Progress toward goals comments: Groups attended Which groups?: Self-esteem,Coping skills,Stress management,Communication,Other (Comment) (Decision Making) Reason goals not met: None Therapeutic equipment acquired: N/A Reason patient discharged from therapy: Discharge from hospital Pt/family agrees with progress & goals achieved: Yes Date patient discharged from therapy: 01/17/21    Victorino Sparrow, LRT/CTRS    Ria Comment, Yomar Mejorado A 01/17/2021, 12:26 PM

## 2021-01-17 NOTE — Tx Team (Signed)
Interdisciplinary Treatment and Diagnostic Plan Update  01/17/2021 Time of Session: 9:25am  [name] [name] MRN: [id number]  Principal Diagnosis: Undifferentiated schizophrenia [facility])  Secondary Diagnoses: Principal Problem:   Undifferentiated schizophrenia (HCC) Active Problems:   Substance-induced psychotic disorder with delusions (HCC)   MDD (major depressive disorder), recurrent, severe, with psychosis (HCC)   Current Medications:  Current Facility-Administered Medications  Medication Dose Route Frequency Provider Last Rate Last Admin  . acetaminophen (TYLENOL) tablet 650 mg  650 mg Oral Q6H PRN White, Patrice L, NP   650 mg at 01/15/21 1305  . alum & mag hydroxide-simeth (MAALOX/MYLANTA) 200-200-20 MG/5ML suspension 30 mL  30 mL Oral Q4H PRN White, Patrice L, NP      . benztropine (COGENTIN) tablet 0.5 mg  0.5 mg Oral Q6H PRN Claudie Revering, MD      . benztropine (COGENTIN) tablet 0.5 mg  0.5 mg Oral BID Claudie Revering, MD   0.5 mg at 01/17/21 0623  . benztropine mesylate (COGENTIN) injection 1 mg  1 mg Intramuscular Q6H PRN Claudie Revering, MD      . cloNIDine (CATAPRES) tablet 0.1 mg  0.1 mg Oral TID PRN Antonieta Pert, MD   0.1 mg at 01/14/21 7628  . fluticasone (FLONASE) 50 MCG/ACT nasal spray 2 spray  2 spray Each Nare Daily Antonieta Pert, MD   2 spray at 01/17/21 3151  . haloperidol (HALDOL) tablet 5 mg  5 mg Oral BID Antonieta Pert, MD   5 mg at 01/17/21 7616  . hydrOXYzine (ATARAX/VISTARIL) tablet 50 mg  50 mg Oral TID PRN Armandina Stammer I, NP   50 mg at 01/16/21 1252  . LORazepam (ATIVAN) tablet 1 mg  1 mg Oral Q6H PRN White, Patrice L, NP   1 mg at 01/16/21 1251   And  . OLANZapine zydis (ZYPREXA) disintegrating tablet 10 mg  10 mg Oral Q8H PRN White, Patrice L, NP   10 mg at 01/13/21 2230   And  . ziprasidone (GEODON) injection 20 mg  20 mg Intramuscular Q6H PRN White, Patrice L, NP   20 mg at 01/09/21 2004  . magnesium hydroxide (MILK OF MAGNESIA)  suspension 30 mL  30 mL Oral Daily PRN White, Patrice L, NP   30 mL at 01/15/21 0827  . metoprolol succinate (TOPROL-XL) 24 hr tablet 100 mg  100 mg Oral Daily Antonieta Pert, MD   100 mg at 01/17/21 0737  . risperiDONE (RISPERDAL M-TABS) disintegrating tablet 3 mg  3 mg Oral BID Claudie Revering, MD   3 mg at 01/17/21 1062  . traZODone (DESYREL) tablet 100 mg  100 mg Oral QHS PRN Antonieta Pert, MD   100 mg at 01/16/21 2128   Current Outpatient Medications  Medication Sig Dispense Refill  . benztropine (COGENTIN) 0.5 MG tablet Take 1 tablet (0.5 mg total) by mouth 2 (two) times daily. For eps 60 tablet 0  . [START ON 01/18/2021] fluticasone (FLONASE) 50 MCG/ACT nasal spray Place 2 sprays into both nostrils daily. For allergies  2  . haloperidol (HALDOL) 5 MG tablet Take 1 tablet (5 mg total) by mouth 2 (two) times daily. For mood control 60 tablet 0  . hydrOXYzine (ATARAX/VISTARIL) 50 MG tablet Take 1 tablet (50 mg total) by mouth 3 (three) times daily as needed for anxiety. 75 tablet 0  . [START ON 01/18/2021] metoprolol succinate (TOPROL-XL) 100 MG 24 hr tablet Take 1 tablet (100 mg total) by mouth daily. Take  with or immediately following a meal: For high blood pressure 30 tablet 0  . risperiDONE (RISPERDAL M-TABS) 3 MG disintegrating tablet Take 1 tablet (3 mg total) by mouth 2 (two) times daily. For mood control 60 tablet 0  . traZODone (DESYREL) 100 MG tablet Take 1 tablet (100 mg total) by mouth at bedtime as needed for sleep. 30 tablet 0   PTA Medications: No medications prior to admission.    Patient Stressors: Marital or family conflict Medication change or noncompliance  Patient Strengths: Geographical information systems officer for treatment/growth  Treatment Modalities: Medication Management, Group therapy, Case management,  1 to 1 session with clinician, Psychoeducation, Recreational therapy.   Physician Treatment Plan for Primary Diagnosis: Undifferentiated  schizophrenia (HCC) Long Term Goal(s): Improvement in symptoms so as ready for discharge Improvement in symptoms so as ready for discharge   Short Term Goals: Ability to identify changes in lifestyle to reduce recurrence of condition will improve Ability to verbalize feelings will improve Ability to disclose and discuss suicidal ideas Ability to identify and develop effective coping behaviors will improve Compliance with prescribed medications will improve Ability to identify triggers associated with substance abuse/mental health issues will improve  Medication Management: Evaluate patient's response, side effects, and tolerance of medication regimen.  Therapeutic Interventions: 1 to 1 sessions, Unit Group sessions and Medication administration.  Evaluation of Outcomes: Adequate for Discharge  Physician Treatment Plan for Secondary Diagnosis: Principal Problem:   Undifferentiated schizophrenia (HCC) Active Problems:   Substance-induced psychotic disorder with delusions (HCC)   MDD (major depressive disorder), recurrent, severe, with psychosis (HCC)  Long Term Goal(s): Improvement in symptoms so as ready for discharge Improvement in symptoms so as ready for discharge   Short Term Goals: Ability to identify changes in lifestyle to reduce recurrence of condition will improve Ability to verbalize feelings will improve Ability to disclose and discuss suicidal ideas Ability to identify and develop effective coping behaviors will improve Compliance with prescribed medications will improve Ability to identify triggers associated with substance abuse/mental health issues will improve     Medication Management: Evaluate patient's response, side effects, and tolerance of medication regimen.  Therapeutic Interventions: 1 to 1 sessions, Unit Group sessions and Medication administration.  Evaluation of Outcomes: Adequate for Discharge   RN Treatment Plan for Primary Diagnosis:  Undifferentiated schizophrenia (HCC) Long Term Goal(s): Knowledge of disease and therapeutic regimen to maintain health will improve  Short Term Goals: Ability to remain free from injury will improve, Ability to demonstrate self-control, Ability to participate in decision making will improve, Ability to verbalize feelings will improve, Ability to disclose and discuss suicidal ideas and Ability to identify and develop effective coping behaviors will improve  Medication Management: RN will administer medications as ordered by provider, will assess and evaluate patient's response and provide education to patient for prescribed medication. RN will report any adverse and/or side effects to prescribing provider.  Therapeutic Interventions: 1 on 1 counseling sessions, Psychoeducation, Medication administration, Evaluate responses to treatment, Monitor vital signs and CBGs as ordered, Perform/monitor CIWA, COWS, AIMS and Fall Risk screenings as ordered, Perform wound care treatments as ordered.  Evaluation of Outcomes: Adequate for Discharge   LCSW Treatment Plan for Primary Diagnosis: Undifferentiated schizophrenia (HCC) Long Term Goal(s): Safe transition to appropriate next level of care at discharge, Engage patient in therapeutic group addressing interpersonal concerns.  Short Term Goals: Engage patient in aftercare planning with referrals and resources, Increase social support, Increase emotional regulation, Facilitate acceptance of mental health diagnosis  and concerns, Identify triggers associated with mental health/substance abuse issues and Increase skills for wellness and recovery  Therapeutic Interventions: Assess for all discharge needs, 1 to 1 time with Social worker, Explore available resources and support systems, Assess for adequacy in community support network, Educate family and significant other(s) on suicide prevention, Complete Psychosocial Assessment, Interpersonal group  therapy.  Evaluation of Outcomes: Adequate for Discharge   Progress in Treatment: Attending groups: Yes. Participating in groups: Yes. Taking medication as prescribed: Yes. Toleration medication: Yes. Family/Significant other contact made: Yes, individual(s) contacted:  Case worker, Marcello Moores. Patient understands diagnosis: Yes. Discussing patient identified problems/goals with staff: Yes. Medical problems stabilized or resolved: Yes. Denies suicidal/homicidal ideation: Yes. Issues/concerns per patient self-inventory: No.   New problem(s) identified: No, Describe:  None   New Short Term/Long Term Goal(s): medication stabilization, elimination of SI thoughts, development of comprehensive mental wellness plan.   Patient Goals:  "To make my mental health better"  Discharge Plan or Barriers: Patient has been given list of shelters.   Reason for Continuation of Hospitalization: Anxiety Depression Hallucinations Medication stabilization  Estimated Length of Stay: 3 to 5 days   Attendees: Patient:  01/08/2021   Physician:  01/08/2021   Nursing:  01/08/2021   RN Care Manager: 01/08/2021   Social Worker: Ruthann Cancer, LCSW 01/08/2021   Recreational Therapist:  01/08/2021   Other:  01/08/2021   Other:  01/08/2021   Other: 01/08/2021     Scribe for Treatment Team: Felizardo Hoffmann, Theresia Majors 01/17/2021 2:33 PM

## 2021-01-17 NOTE — Plan of Care (Signed)
Pt was able to identify was to help him calm down when angry at completion of recreation therapy group sessions.    Caroll Rancher, LRT/CTRS

## 2021-01-17 NOTE — Progress Notes (Signed)
  [facility] Adult Case Management Discharge Plan :  Will you be returning to the same living situation after discharge:  No. Will be staying with father At discharge, do you have transportation home?: No. Bus passes provided to this patient Do you have the ability to pay for your medications: No. Samples to be provided at discharge  Release of information consent forms completed and in the chart;  Patient's signature needed at discharge.  Patient to Follow up at:  Follow-up Information    [facility]. Go on 01/17/2021.   Specialty: Behavioral Health Why: You have an appointment to obtain therapy services on 01/17/21 at 12:00 pm.  You also have an appointment to obtain medication management services on 02/06/21 at 7:45 am.  These appointments will be held in person and are first come, first served. Contact information: [phone] [place] [address] [phone]       [facility] Call.   Why: A referral has been made on your behalf for CST services. Please follow up at discharge to schedule an intake appointment.  Contact information: [address] [place], [place] [address]  Telephone: [phone] Fax: [phone]              Next level of care provider has access to [facility] Link:no  Safety Planning and Suicide Prevention discussed: Yes,  with caseworker  Have you used any form of tobacco in the last 30 days? (Cigarettes, Smokeless Tobacco, Cigars, and/or Pipes): Yes  Has patient been referred to the Quitline?: Patient refused referral  Patient has been referred for addiction treatment: Pt. refused referral  Otelia Santee, LCSW 01/17/2021, 11:52 AM

## 2021-01-17 NOTE — BHH Suicide Risk Assessment (Signed)
[facility] Discharge Suicide Risk Assessment   Principal Problem: Undifferentiated schizophrenia [facility]) Discharge Diagnoses: Principal Problem:   Undifferentiated schizophrenia (HCC) Active Problems:   Substance-induced psychotic disorder with delusions (HCC)   MDD (major depressive disorder), recurrent, severe, with psychosis (HCC)   Total Time spent with patient: 20 minutes  Musculoskeletal: Strength & Muscle Tone: within normal limits Gait & Station: normal Patient leans: N/A  Psychiatric Specialty Exam: Review of Systems  Constitutional: Negative for chills, diaphoresis and fever.  HENT: Negative.   Eyes: Negative for discharge.  Respiratory: Negative for cough, choking and shortness of breath.   Cardiovascular: Negative for chest pain and palpitations.  Gastrointestinal: Negative for constipation, diarrhea, nausea and vomiting.  Genitourinary: Negative for difficulty urinating.  Musculoskeletal: Negative.   Skin: Negative.   Neurological: Negative.   Psychiatric/Behavioral: Negative for agitation, behavioral problems, confusion, dysphoric mood, hallucinations, self-injury, sleep disturbance and suicidal ideas. The patient is not nervous/anxious and is not hyperactive.     Blood pressure 121/88, pulse (!) 108, temperature 98 F (36.7 C), temperature source Oral, resp. rate 18, height 5\' 6"  (1.676 m), weight 101.2 kg, SpO2 100 %.Body mass index is 35.99 kg/m.  General Appearance: Casual and Fairly Groomed  ::  Fair  Speech:  Clear and Coherent and Normal Rate  Volume:  Normal  Mood:  "better"   Affect:  Flat  Thought Process:  Coherent and Goal Directed  Orientation:  Full (Time, Place, and Person)  Thought Content:  Logical  Denies paranoia or hallucinations  Suicidal Thoughts:  No  Homicidal Thoughts:  No  Memory:  Immediate;   Fair Recent;   Fair Remote;   Fair  Judgement:  Fair  Insight:  Fair  Psychomotor Activity:  Decreased  Concentration:  Fair   Recall:  [id number] of Knowledge:Fair  Language: Good  Akathisia:  No    AIMS (if indicated):     Assets:  Communication Skills Desire for Improvement Resilience Social Support Vocational/Educational  Sleep:  Number of Hours: 6.75  Cognition: WNL  ADL's:  Intact   Mental Status Per Nursing Assessment::   On Admission:  Suicidal ideation indicated by patient,Plan to harm others  Demographic Factors:  Male and Living alone  Loss Factors: Financial problems/change in socioeconomic status  Historical Factors: Impulsivity  Risk Reduction Factors:   Employed and Positive social support  Continued Clinical Symptoms:  Depression - improving Alcohol/Substance Abuse/Dependencies Schizophrenia:   Less than 29 years old Paranoid or undifferentiated type More than one psychiatric diagnosis  Cognitive Features That Contribute To Risk:  None    Suicide Risk:  Mild:  Suicidal ideation of limited frequency, intensity, duration, and specificity.  There are no identifiable plans, no associated intent, mild dysphoria and related symptoms, good self-control (both objective and subjective assessment), few other risk factors, and identifiable protective factors, including available and accessible social support.   Follow-up Information    [facility]. Go on 01/17/2021.   Specialty: Behavioral Health Why: You have an appointment to obtain therapy services on 01/17/21 at 12:00 pm.  You also have an appointment to obtain medication management services on 02/06/21 at 7:45 am.  These appointments will be held in person and are first come, first served. Contact information: [address] [place] [phone]       [facility] Call.   Why: A referral has been made on your behalf for CST services. Please follow up at discharge to schedule an intake appointment.  Contact information: [address] [place], [place]  [address]  Telephone: [phone] Fax: [phone]              Plan Of Care/Follow-up recommendations:  Activity:  as tolerated  Other:   -Take medications as prescribed -Do not drink alcohol -Do not use marijuana or other drugs -Keep outpatient therapy and outpatient mental health medication management appointments -Contact [facility] [employer] at the number shown above to obtain CST services -Participate in outpatient substance abuse treatment program -Follow-up with primary care provider regarding any medical or physical issues  Claudie Revering, MD 01/17/2021, 11:00 AM

## 2021-01-17 NOTE — Progress Notes (Signed)
Pt discharged to lobby. Pt was stable and appreciative at that time. All papers and prescriptions were given and valuables returned. Verbal understanding expressed. Denies SI/HI and A/VH. Pt given opportunity to express concerns and ask questions.  

## 2021-01-17 NOTE — BHH Group Notes (Signed)
BHH LCSW Group Therapy   01/17/2021 11:57 AM    Type of Therapy and Topic:  Group Therapy:  Strengths Exploration   Participation Level: Minimal  Description of Group: This group allows individuals to explore their strengths, learn to use strengths in new ways to improve well-being. Strengths-based interventions involve identifying strengths, understanding how they are used, and learning new ways to apply them. Individuals will identify their strengths, and then explore their roles in different areas of life (relationships, professional life, and personal fulfillment). Individuals will think about ways in which they currently use their strengths, along with new ways they could begin using them.    Therapeutic Goals 1. Patient will verbalize two of their strengths 2. Patient will identify how their strengths are currently used 3. Patient will identify two new ways to apply their strengths  4. Patients will create a plan to apply their strengths in their daily lives     Summary of Patient Progress:  Pt accepted worksheet, however declined to comment on strengths.     Therapeutic Modalities Cognitive Behavioral Therapy Motivational Interviewing   Ruthann Cancer MSW, LCSW Clincal Social Worker  [facility]

## 2021-01-17 NOTE — Plan of Care (Signed)
°  Problem: Education: °Goal: Emotional status will improve °Outcome: Progressing °Goal: Mental status will improve °Outcome: Progressing °Goal: Verbalization of understanding the information provided will improve °Outcome: Progressing °  °
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
## 2021-01-20 ENCOUNTER — Other Ambulatory Visit: Payer: Self-pay

## 2021-01-27 ENCOUNTER — Other Ambulatory Visit: Payer: Self-pay

## 2021-02-09 ENCOUNTER — Ambulatory Visit (HOSPITAL_COMMUNITY)
Admission: EM | Admit: 2021-02-09 | Discharge: 2021-02-09 | Disposition: A | Discharge status: Home / Self Care | Payer: No Payment, Other | Attending: Psychiatry | Admitting: Psychiatry

## 2021-02-09 ENCOUNTER — Other Ambulatory Visit: Payer: Self-pay

## 2021-02-09 DIAGNOSIS — F19959 Other psychoactive substance use, unspecified with psychoactive substance-induced psychotic disorder, unspecified: Secondary | ICD-10-CM | POA: Insufficient documentation

## 2021-02-09 DIAGNOSIS — F19950 Other psychoactive substance use, unspecified with psychoactive substance-induced psychotic disorder with delusions: Secondary | ICD-10-CM

## 2021-02-09 NOTE — ED Provider Notes (Signed)
Behavioral Health Urgent Care Medical Screening Exam  Patient Name: [name] [name] MRN: [id number] Date of Evaluation: 02/09/21 Chief Complaint:   Diagnosis:  Final diagnoses:  Substance-induced psychotic disorder with delusions (HCC)    History of Present illness: [name] [name] is a 30 y.o. male.  Patient presents voluntarily to [facility] behavioral health for walk-in assessment.   Patient assessed by nurse practitioner.  He is alert and oriented, answers appropriately.  He is mildly irritable but cooperative during assessment.  He is insightful regarding reason for presenting to behavioral health.  He denies homicidal and suicidal ideations.  He denies auditory visual hallucinations.  There is no evidence of delusional thought content and he denies symptoms of paranoia.  He contracts verbally for safety with this Clinical research associate.  He states "I am worried my dad will throw me back out in the street with the way that I have been acting, he does not want any throwing tantrums and breaking things in his house."  Makell reports he broke his father's door several weeks ago but his father did not press charges and he has apologized for breaking the door.  Stephanos reports recent stressor is inability to "find weed."  Gabor reports chronic marijuana use.  He reports he has been unable to find a marijuana for approximately 1 week.  He reports he becomes more "aggravated" when he does not have access to marijuana.  At this time he is not interested in stopping marijuana use.  He reports his plan is to ultimately relocate to [place] state because he believes we need will be more readily available there.  [name] has been diagnosed with schizophrenia, major depressive disorder and substance induced psychotic disorder.  He is scheduled to follow-up with [facility] behavioral health outpatient but has missed his most recent appointment.  He reports he is not compliant with his medications as  he believes "it does not help, it does nothing."  Migraines currently resides in his father's backyard in a trailer home.  He denies access to weapons.  He is employed as a Chief Financial Officer.  He endorses chronic marijuana use, denies alcohol use, denies substance use aside from marijuana.  He endorses average sleep and appetite.  Patient offered support and encouragement.  He gives verbal consent to speak with his father, Carlena Sax. Spoke with patient's father who reports he would like to get long-term assistance for his son.  Patient's father reports that Josafat has a twin brother who is followed by an act team, would like for Jaelen to be followed by act team as well if possible.  Discussed CST referral, information provided.   Psychiatric Specialty Exam  Presentation  General Appearance:Appropriate for Environment; Casual  Eye Contact:Fair  Speech:Clear and Coherent; Normal Rate  Speech Volume:Normal  Handedness:Right   Mood and Affect  Mood:Irritable  Affect:Congruent   Thought Process  Thought Processes:Coherent; Goal Directed  Descriptions of Associations:Intact  Orientation:Full (Time, Place and Person)  Thought Content:Logical  Diagnosis of Schizophrenia or Schizoaffective disorder in past: Yes  Duration of Psychotic Symptoms: Greater than six months  Hallucinations:None  Ideas of Reference:None  Suicidal Thoughts:No  Homicidal Thoughts:No   Sensorium  Memory:Immediate Fair; Recent Fair; Remote Fair  Judgment:Fair  Insight:Fair   Executive Functions  Concentration:Fair  Attention Span:Fair  Recall:Fair  Fund of Knowledge:Fair  Language:Fair   Psychomotor Activity  Psychomotor Activity:Normal   Assets  Assets:Communication Skills; Desire for Improvement; Financial Resources/Insurance; Housing; Intimacy; Leisure Time; Physical Health; Resilience; Social Support; Talents/Skills; Transportation; Vocational/Educational  Sleep   Sleep:Good  Number of hours: 6.5   No data recorded  Physical Exam: Physical Exam Vitals and nursing note reviewed.  Constitutional:      Appearance: Normal appearance. He is well-developed.  HENT:     Head: Normocephalic and atraumatic.     Nose: Nose normal.  Cardiovascular:     Rate and Rhythm: Normal rate.  Pulmonary:     Effort: Pulmonary effort is normal.  Musculoskeletal:        General: Normal range of motion.     Cervical back: Normal range of motion.  Neurological:     Mental Status: He is alert and oriented to person, place, and time.  Psychiatric:        Attention and Perception: Attention and perception normal.        Mood and Affect: Mood and affect normal.        Speech: Speech normal.        Behavior: Behavior normal. Behavior is cooperative.        Thought Content: Thought content normal.        Cognition and Memory: Cognition and memory normal.        Judgment: Judgment normal.    Review of Systems  Constitutional: Negative.   HENT: Negative.   Eyes: Negative.   Respiratory: Negative.   Cardiovascular: Negative.   Gastrointestinal: Negative.   Genitourinary: Negative.   Musculoskeletal: Negative.   Skin: Negative.   Neurological: Negative.   Endo/Heme/Allergies: Negative.   Psychiatric/Behavioral: Positive for substance abuse.   There were no vitals taken for this visit. There is no height or weight on file to calculate BMI.  Musculoskeletal: Strength & Muscle Tone: within normal limits Gait & Station: normal Patient leans: N/A   [facility] MSE Discharge Disposition for Follow up and Recommendations: Based on my evaluation the patient does not appear to have an emergency medical condition and can be discharged with resources and follow up care in outpatient services for Medication Management and Individual Therapy  Patient reviewed with Dr. Lucianne Muss. Continue current medications. Follow-up with outpatient psychiatry, resources  provided. Follow-up with CST referral previously provided.   Lenard Lance, FNP 02/09/2021, 7:41 PM

## 2021-02-09 NOTE — Progress Notes (Signed)
Father brought patient to the [facility] today because of his angger issues.  Father states that patient got angry last week and broke his window and his door.  Today, father states that patient got so angry that he was making statements about killing this person.  patient states that he does not really know this person, but he was called a "nigger" by him and it angered him.  Patient states that he is angry because there are people out there with that mentality and it angers him.  Patient was just released from [facility] approximately a week ago, but states that he did not keep his OP appointment nor has he been taking his medications.  Patient states that he just has a temper and he really does not want to hurt himself or others, but states that he is just unable to manage his emotions and his anger and he tends to break things and goes off on people.  Patient states that he normally smokes marijuana to keep him calm, but states that he has not been able to get any lately.  Patient was able to de-escalate his emotions and felt safe for discharge and was referred to follow-up at the [facility] OP in the morning for therapy and medication management.  Patient is routine

## 2021-02-09 NOTE — Discharge Instructions (Signed)
[employer] Call.   Why: A referral has been made on your behalf for CST services. Please follow up at discharge to schedule an intake appointment.  Contact information: [address] [place], [place] [address]   Telephone: [phone] Fax: [phone]   Patient is instructed prior to discharge to:  Take all medications as prescribed by his/her mental healthcare provider. Report any adverse effects and or reactions from the medicines to his/her outpatient provider promptly. Keep all scheduled appointments, to ensure that you are getting refills on time and to avoid any interruption in your medication.  If you are unable to keep an appointment call to reschedule.  Be sure to follow-up with resources and follow-up appointments provided.  Patient has been instructed & cautioned: To not engage in alcohol and or illegal drug use while on prescription medicines. In the event of worsening symptoms, patient is instructed to call the crisis hotline, 911 and or go to the nearest ED for appropriate evaluation and treatment of symptoms. To follow-up with his/her primary care provider for your other medical issues, concerns and or health care needs.

## 2021-02-11 ENCOUNTER — Telehealth (HOSPITAL_COMMUNITY): Payer: Self-pay | Admitting: Nurse Practitioner

## 2021-02-11 NOTE — BH Assessment (Signed)
Care Management - Follow Up [facility] Discharges   Writer attempted to make contact with patient today and was unsuccessful.  Writer was able to leave a HIPPA compliant voice message and will await callback.   

## 2021-02-14 ENCOUNTER — Other Ambulatory Visit: Payer: Self-pay

## 2021-02-14 DIAGNOSIS — F19959 Other psychoactive substance use, unspecified with psychoactive substance-induced psychotic disorder, unspecified: Secondary | ICD-10-CM | POA: Insufficient documentation

## 2021-02-14 DIAGNOSIS — F19950 Other psychoactive substance use, unspecified with psychoactive substance-induced psychotic disorder with delusions: Secondary | ICD-10-CM

## 2021-02-15 ENCOUNTER — Ambulatory Visit (HOSPITAL_COMMUNITY)
Admission: EM | Admit: 2021-02-15 | Discharge: 2021-02-15 | Disposition: A | Discharge status: Other Acute Inpatient Hospital | Payer: No Payment, Other | Attending: Addiction Medicine | Admitting: Addiction Medicine

## 2021-02-15 ENCOUNTER — Emergency Department (HOSPITAL_COMMUNITY)
Admission: EM | Admit: 2021-02-15 | Discharge: 2021-02-15 | Disposition: A | Discharge status: Home / Self Care | Payer: Medicaid Other | Attending: Emergency Medicine | Admitting: Emergency Medicine

## 2021-02-15 DIAGNOSIS — R456 Violent behavior: Secondary | ICD-10-CM | POA: Insufficient documentation

## 2021-02-15 DIAGNOSIS — R454 Irritability and anger: Secondary | ICD-10-CM | POA: Insufficient documentation

## 2021-02-15 DIAGNOSIS — F1999 Other psychoactive substance use, unspecified with unspecified psychoactive substance-induced disorder: Secondary | ICD-10-CM | POA: Diagnosis present

## 2021-02-15 DIAGNOSIS — F19959 Other psychoactive substance use, unspecified with psychoactive substance-induced psychotic disorder, unspecified: Secondary | ICD-10-CM | POA: Diagnosis not present

## 2021-02-15 DIAGNOSIS — R451 Restlessness and agitation: Secondary | ICD-10-CM | POA: Insufficient documentation

## 2021-02-15 DIAGNOSIS — F17210 Nicotine dependence, cigarettes, uncomplicated: Secondary | ICD-10-CM | POA: Insufficient documentation

## 2021-02-15 DIAGNOSIS — Z046 Encounter for general psychiatric examination, requested by authority: Secondary | ICD-10-CM | POA: Insufficient documentation

## 2021-02-15 DIAGNOSIS — F19950 Other psychoactive substance use, unspecified with psychoactive substance-induced psychotic disorder with delusions: Secondary | ICD-10-CM

## 2021-02-15 DIAGNOSIS — F12959 Cannabis use, unspecified with psychotic disorder, unspecified: Secondary | ICD-10-CM | POA: Insufficient documentation

## 2021-02-15 DIAGNOSIS — Z20822 Contact with and (suspected) exposure to covid-19: Secondary | ICD-10-CM | POA: Insufficient documentation

## 2021-02-15 DIAGNOSIS — F333 Major depressive disorder, recurrent, severe with psychotic symptoms: Secondary | ICD-10-CM | POA: Insufficient documentation

## 2021-02-15 LAB — CBC WITH DIFFERENTIAL/PLATELET
Abs Immature Granulocytes: 0.05 10*3/uL (ref 0.00–0.07)
Basophils Absolute: 0.1 10*3/uL (ref 0.0–0.1)
Basophils Relative: 1 %
Eosinophils Absolute: 0.1 10*3/uL (ref 0.0–0.5)
Eosinophils Relative: 1 %
HCT: 39.5 % (ref 39.0–52.0)
Hemoglobin: 12.8 g/dL — ABNORMAL LOW (ref 13.0–17.0)
Immature Granulocytes: 0 %
Lymphocytes Relative: 18 %
Lymphs Abs: 2.1 10*3/uL (ref 0.7–4.0)
MCH: 29 pg (ref 26.0–34.0)
MCHC: 32.4 g/dL (ref 30.0–36.0)
MCV: 89.6 fL (ref 80.0–100.0)
Monocytes Absolute: 0.8 10*3/uL (ref 0.1–1.0)
Monocytes Relative: 7 %
Neutro Abs: 8.4 10*3/uL — ABNORMAL HIGH (ref 1.7–7.7)
Neutrophils Relative %: 73 %
Platelets: 324 10*3/uL (ref 150–400)
RBC: 4.41 MIL/uL (ref 4.22–5.81)
RDW: 13.2 % (ref 11.5–15.5)
WBC: 11.5 10*3/uL — ABNORMAL HIGH (ref 4.0–10.5)
nRBC: 0 % (ref 0.0–0.2)

## 2021-02-15 LAB — RESP PANEL BY RT-PCR (FLU A&B, COVID) ARPGX2
Influenza A by PCR: NEGATIVE
Influenza B by PCR: NEGATIVE
SARS Coronavirus 2 by RT PCR: NEGATIVE

## 2021-02-15 LAB — COMPREHENSIVE METABOLIC PANEL
ALT: 39 U/L (ref 0–44)
AST: 25 U/L (ref 15–41)
Albumin: 4.4 g/dL (ref 3.5–5.0)
Alkaline Phosphatase: 151 U/L — ABNORMAL HIGH (ref 38–126)
Anion gap: 7 (ref 5–15)
BUN: 10 mg/dL (ref 6–20)
CO2: 24 mmol/L (ref 22–32)
Calcium: 9 mg/dL (ref 8.9–10.3)
Chloride: 108 mmol/L (ref 98–111)
Creatinine, Ser: 0.46 mg/dL — ABNORMAL LOW (ref 0.61–1.24)
GFR, Estimated: >60 mL/min (ref >60)
Glucose, Bld: 97 mg/dL (ref 70–99)
Potassium: 3.9 mmol/L (ref 3.5–5.1)
Sodium: 139 mmol/L (ref 135–145)
Total Bilirubin: 0.5 mg/dL (ref 0.3–1.2)
Total Protein: 7.4 g/dL (ref 6.5–8.1)

## 2021-02-15 LAB — RAPID URINE DRUG SCREEN, HOSP PERFORMED
Amphetamines: NOT DETECTED
Barbiturates: NOT DETECTED
Benzodiazepines: NOT DETECTED
Cocaine: NOT DETECTED
Opiates: NOT DETECTED
Tetrahydrocannabinol: POSITIVE — AB

## 2021-02-15 LAB — ACETAMINOPHEN LEVEL: Acetaminophen (Tylenol), Serum: <10 ug/mL — ABNORMAL LOW (ref 10–30)

## 2021-02-15 LAB — ETHANOL: Alcohol, Ethyl (B): <10 mg/dL (ref <10)

## 2021-02-15 LAB — SALICYLATE LEVEL: Salicylate Lvl: <7 mg/dL — ABNORMAL LOW (ref 7.0–30.0)

## 2021-02-15 MED ORDER — OLANZAPINE 5 MG PO TBDP: 5.0000 mg | ORAL_TABLET | Freq: Three times a day (TID) | ORAL | Status: DC | PRN

## 2021-02-15 MED ORDER — LORAZEPAM 1 MG PO TABS: 1.0000 mg | ORAL_TABLET | ORAL | Status: DC | PRN

## 2021-02-15 MED ORDER — BENZTROPINE MESYLATE 0.5 MG PO TABS: 0.5000 mg | ORAL_TABLET | Freq: Two times a day (BID) | ORAL | Status: DC

## 2021-02-15 MED ORDER — ZIPRASIDONE MESYLATE 20 MG IM SOLR: 20.0000 mg | INTRAMUSCULAR | Status: DC | PRN

## 2021-02-15 MED ORDER — HYDROXYZINE HCL 25 MG PO TABS: 50.0000 mg | ORAL_TABLET | Freq: Three times a day (TID) | ORAL | Status: DC | PRN

## 2021-02-15 MED ORDER — HALOPERIDOL 5 MG PO TABS: 5.0000 mg | ORAL_TABLET | Freq: Two times a day (BID) | ORAL | Status: DC

## 2021-02-15 MED ORDER — METOPROLOL SUCCINATE ER 50 MG PO TB24: 100.0000 mg | ORAL_TABLET | Freq: Every day | ORAL | Status: DC

## 2021-02-15 MED ORDER — RISPERIDONE 1 MG PO TBDP: 3.0000 mg | ORAL_TABLET | Freq: Two times a day (BID) | ORAL | Status: DC

## 2021-02-15 MED ORDER — TRAZODONE HCL 100 MG PO TABS: 100.0000 mg | ORAL_TABLET | Freq: Every evening | ORAL | Status: DC | PRN

## 2021-02-15 NOTE — ED Notes (Signed)
TTS assessment taking place at this time.

## 2021-02-15 NOTE — Consult Note (Addendum)
  Collateral: [place] (dad) returned call Patient's dad returned call and states patient is not welcomed back to his home. Provider discussed inpatient criteria and that patient doesn't currently meet criteria. Dad expressed his frustration stating son has been battling mental illness for the past 10 years; says most recent encounter "was the last". States patient has twin brother with schizophrenia and mother passed away 3 years ago. Dad stated throughout the conversation that he is unable to "care for him, he's grown"; provider attempted to explain chronicity of mental illness and probability that he may need lifetime treatment. Dad states patient's twin brother has an Investment banker, operational whom he credits with managing patient's medications and obtaining housing; states patient has Medicaid. Provider discussed available care coordination through patient's medicaid and process to obtain an ACTT Team and other services. Dad responded, "I'm not doing all that, he'll have to just fall through the cracks. He has done too much. He's a grown man". Further stating he "can't be responsible for an adult, maybe it will take for him to do something like his brother did to get put away because I'm not going to be responsible for him. Maybe the prison system will help him". Provider attempted to explain available community supports, guardianship, etc. He declined any information or follow-up from social work. Patient has been cleared by psychiatry; SW consult placed to follow up with patient.

## 2021-02-15 NOTE — BH Assessment (Addendum)
Comprehensive Clinical Assessment (CCA) Note  02/15/2021 [name] [name] [id number] Recommendations for Services/Supports/Treatments: Consulted with Laurina BustleShalon B., NP who determined pt. [facility] and reassessed in the AM.   Pt presented with a disheveled appearance. Pt's speech was clear and coherent. Motor behavior appears agitated, evidenced by his leg shaking fervently throughout the assessment. Pt was actively responding to internal stimuli. Pt would have bouts of abruptly and inappropriately laughing throughout the assessment. Pt's thought content was relevant to the context of the situation. Eye contact was fleeting. Pt's mood is irritable, affect is congruent with mood. Patient was noted to have poor insight and was unable to identify why he was referred. Pt reports that his main stressors are the dynamics between him and his father. Pt explained that his father does not know how to talk to him and treats him like a dog. Pt denies substance use. The patient denied SI, HI, AV/hallucinations, or symptoms of paranoia. It was noted that the pt. was unable to contract for safety if [facility]. The patient demonstrates the following risk factors for suicide: Chronic risk factors for suicide include: psychiatric disorder of schizophrenia. Acute risk factors for suicide include: family or marital conflict. Protective factors for this patient include: hope for the future. Considering these factors, the overall suicide risk at this point appears to be no risk. Patient is not appropriate for outpatient follow up.  Therefore a 1:1 sitter for suicide precautions is not recommended at this time.   Chief Complaint: No chief complaint on file.  Visit Diagnosis: Undifferentiated schizophrenia    CCA Screening, Triage and Referral (STR)  Patient Reported Information How did you hear about [name] [name]? Family/Friend  Referral name: Father  Referral phone number: [phone])   Whom do you see for routine medical problems? I don't have a doctor  Practice/Facility Name: unknown  Practice/Facility Phone Number: No data recorded Name of Contact: unknown  Contact Number: unknown  Contact Fax Number: unknown  Prescriber Name: unknown  Prescriber Address (if known): unknown   What Is the Reason for Your Visit/Call Today? [name],[name]  How Long Has This Been Causing You Problems? > than 6 months  What Do You Feel Would Help You the Most Today? -- (Pt has poor insight and claims that he does not know why [facility]'d [facility])   Have You Recently Been in Any Inpatient Treatment (Hospital/Detox/Crisis Center/28-Day Program)? No  Name/Location of Program/Hospital:No data recorded How Long Were You There? No data recorded When Were You Discharged? No data recorded  Have You Ever Received Services From [facility] Before? Yes  Who Do You [facility]? Recently presenting to the Emergency Deparment 2x's recently   Have You Recently Had Any Thoughts About Hurting Yourself? No  Are You Planning to Commit Suicide/Harm Yourself At This time? No   Have you Recently Had Thoughts About Hurting Someone Karolee Ohslse? No  Explanation: "People"; "Anyone"   Have You Used Any Alcohol or Drugs in the Past 24 Hours? No  How Long Ago Did You Use Drugs or Alcohol? No data recorded What Did You Use and How Much? Patient reports use of THC 1-2 week ago   Do You Currently Have a Therapist/Psychiatrist? No  Name of Therapist/Psychiatrist: Patient denies; Upon chart review has received services with [facility]   Have You Been Recently Discharged From Any Office Practice or Programs? No  Explanation of Discharge From Practice/Program: Patient reported 9 months ago  that he was discharging from [employer] [facility] Team with [facility]     CCA Screening Triage Referral Assessment Type of Contact: Face-to-Face  Is this Initial or Reassessment? No  data recorded Date Telepsych consult ordered in [facility]:  No data recorded Time Telepsych consult ordered in [facility]:  No data recorded  Patient Reported Information Reviewed? Yes  Patient Left Without Being Seen? No data recorded Reason for Not Completing Assessment: No data recorded  Collateral Involvement: No data recorded  Does Patient Have a Court Appointed Legal Guardian? No data recorded Name and Contact of Legal Guardian: No data recorded If Minor and Not Living with Parent(s), Who has Custody? No data recorded Is CPS involved or ever been involved? Never  Is APS involved or ever been involved? Never   Patient Determined To Be At Risk for Harm To Self or Others Based on Review of Patient Reported Information or Presenting Complaint? No  Method: No data recorded Availability of Means: No data recorded Intent: No data recorded Notification Required: No data recorded Additional Information for Danger to Others Potential: No data recorded Additional Comments for Danger to Others Potential: No data recorded Are There Guns or Other Weapons in Your Home? No data recorded Types of Guns/Weapons: No data recorded Are These Weapons Safely Secured?                            No data recorded Who Could Verify You Are Able To Have These Secured: No data recorded Do You Have any Outstanding Charges, Pending Court Dates, Parole/Probation? No data recorded Contacted To Inform of Risk of Harm To Self or Others: No data recorded  Location of Assessment: [facility]   Does Patient Present under Involuntary Commitment? Yes  IVC Papers Initial File Date: 02/14/2021   [place] of Residence: [place]   Patient Currently Receiving the Following Services: Not Receiving Services   Determination of Need: Urgent (48 hours)   Options For Referral: -- (Per Ysidro Evert, NP Pt to be transported to the ED for overnight observation)     CCA Biopsychosocial Intake/Chief Complaint:   [name] [name] is a 30 y.o. male with a hx of schizophrenia, depression, and substance induced psychotic disorder who was brought to [facility] by [facility] under IVC due to continued aggression towards his father.  Current Symptoms/Problems: [name] [name] is a 30 y.o. male with a hx of schizophrenia, depression, and substance induced psychotic disorder who was brought to [facility] by [employer] under IVC due to continued aggression towards his father.   Patient Reported Schizophrenia/Schizoaffective Diagnosis in Past: Yes   Strengths: UTA  Preferences: None reported  Abilities: Pt able to ask for help   Type of Services Patient Feels are Needed: UTA   Initial Clinical Notes/Concerns: unk   Mental Health Symptoms Depression:  None   Duration of Depressive symptoms: Greater than two weeks   Mania:  Irritability   Anxiety:   Restlessness; Worrying; Tension   Psychosis:  Hallucinations   Duration of Psychotic symptoms: Greater than six months   Trauma:  None   Obsessions:  None   Compulsions:  N/A   Inattention:  None   Hyperactivity/Impulsivity:  N/A   Oppositional/Defiant Behaviors:  None   Emotional Irregularity:  Intense/inappropriate anger   Other Mood/Personality Symptoms:  No data recorded   Mental Status Exam Appearance and self-care  Stature:  Average   Weight:  Overweight   Clothing:  Disheveled   Grooming:  Neglected   Cosmetic use:  None   Posture/gait:  Normal   Motor activity:  Agitated   Sensorium  Attention:  Distractible   Concentration:  Preoccupied   Orientation:  X5   Recall/memory:  Normal   Affect and Mood  Affect:  Anxious   Mood:  Irritable   Relating  Eye contact:  None   Facial expression:  Tense   Attitude toward examiner:  Irritable   Thought and Language  Speech flow: Loud   Thought content:  Appropriate to Mood and Circumstances   Preoccupation:  None   Hallucinations:  Auditory; Visual   Organization:  No data  recorded  [employer] of Knowledge:  Average   Intelligence:  Average   Abstraction:  Concrete   Judgement:  Impaired   Reality Testing:  Distorted   Insight:  None/zero insight   Decision Making:  Only simple   Social Functioning  Social Maturity:  Isolates   Social Judgement:  Heedless   Stress  Stressors:  Illness   Coping Ability:  Exhausted; Deficient supports   Skill Deficits:  Self-care; Self-control   Supports:  Support needed; Family     Religion: Religion/Spirituality Are You A Religious Person?:  Industrial/product designer)  Leisure/Recreation: Leisure / Recreation Do You Have Hobbies?:  (UTA)  Exercise/Diet: Exercise/Diet Do You Exercise?:  (UTA) Have You Gained or Lost A Significant Amount of Weight in the Past Six Months?:  (UTA) Do You Follow a Special Diet?:  (UTA) Do You Have Any Trouble Sleeping?:  (UTA)   CCA Employment/Education Employment/Work Situation: Employment / Work Situation Employment situation: Unemployed Patient's job has been impacted by current illness:  (UTA) 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) Where was the patient employed at that time?:  (UTA) Has patient ever been in the [employer]?: No  Education: Education Is Patient Currently Attending School?: No   CCA Family/Childhood History Family and Relationship History: Family history Marital status: Single Are you sexually active?:  (UTA) What is your sexual orientation?:  (UTA) Has your sexual activity been affected by drugs, alcohol, medication, or emotional stress?:  (UTA) Does patient have children?: No  Childhood History:  Childhood History By whom was/is the patient raised?:  (UTA) Additional childhood history information:  (UTA) Description of patient's relationship with caregiver when they were a child:  (UTA) Patient's description of current relationship with people who raised him/her:  (UTA) How were you disciplined when you got in  trouble as a child/adolescent?:  (UTA) Does patient have siblings?:  (UTA) Did patient suffer any verbal/emotional/physical/sexual abuse as a child?:  (UTA) Did patient suffer from severe childhood neglect?:  (UTA) Has patient ever been sexually abused/assaulted/raped as an adolescent or adult?:  (UTA) Was the patient ever a victim of a crime or a disaster?:  (UTA) Witnessed domestic violence?:  (UTA) Has patient been affected by domestic violence as an adult?:  Industrial/product designer)  Child/Adolescent Assessment:     CCA Substance Use Alcohol/Drug Use: Alcohol / Drug Use Pain Medications: see MAR Prescriptions: see MAR Over the Counter: see MAR History of alcohol / drug use?: Yes Longest period of sobriety (when/how long): Unknown Negative Consequences of Use:  (uta) Withdrawal Symptoms:  (UTA)                         ASAM's:  Six Dimensions of Multidimensional Assessment  Dimension 1:  Acute Intoxication and/or Withdrawal Potential:   Dimension  1:  Description of individual's past and current experiences of substance use and withdrawal:  (UTA)  Dimension 2:  Biomedical Conditions and Complications:   Dimension 2:  Description of patient's biomedical conditions and  complications:  (UTA)  Dimension 3:  Emotional, Behavioral, or Cognitive Conditions and Complications:  Dimension 3:  Description of emotional, behavioral, or cognitive conditions and complications:  (UTA)  Dimension 4:  Readiness to Change:  Dimension 4:  Description of Readiness to Change criteria:  (UTA)  Dimension 5:  Relapse, Continued use, or Continued Problem Potential:  Dimension 5:  Relapse, continued use, or continued problem potential critiera description:  (UTA)  Dimension 6:  Recovery/Living Environment:  Dimension 6:  Recovery/Iiving environment criteria description:  (UTA)  ASAM Severity Score:    ASAM Recommended Level of Treatment: ASAM Recommended Level of Treatment:  (UTA)   Substance use Disorder  (SUD) Substance Use Disorder (SUD)  Checklist Symptoms of Substance Use:  (UTA)  Recommendations for Services/Supports/Treatments: Recommendations for Services/Supports/Treatments Recommendations For Services/Supports/Treatments:  (UTA)  DSM5 Diagnoses: Patient Active Problem List   Diagnosis Date Noted  . 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 Substance-induced psychotic disorder with delusions (HCC) 08/30/2012    Donnamaria Shands R Bellmore, LCAS

## 2021-02-15 NOTE — ED Notes (Signed)
Pt given lunch tray.

## 2021-02-15 NOTE — ED Provider Notes (Signed)
[facility]-EMERGENCY DEPT Provider Note   CSN: [id number] Arrival date & time: 02/15/21  0041     History No chief complaint on file.   [name] [name] is a 30 y.o. male.  Patient to ED under Involuntary Commitment by father for aggressive behavior, talking to people who aren't there. History of same. Per chart review, history of schizophrenia, admissions for SI. The patient denies wanting to hurt others, does not feel suicidal. Police took the patient the [facility], however, he exhibited volatile, violent behavior there and was sent here for further management and evaluation.   The history is provided by the patient and the police. No language interpreter was used.       Past Medical History:  Diagnosis Date  . Schizophrenia (HCC)   . Tibial plateau fracture, left     Patient Active Problem List   Diagnosis Date Noted  . 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 APPENDECTOMY    . LAPAROSCOPIC APPENDECTOMY N/A 07/16/2017   Procedure: APPENDECTOMY LAPAROSCOPIC;  Surgeon: Axel Filler, MD;  Location: [facility] OR;  Service: General;  Laterality: N/A;  . LAPAROTOMY N/A 07/16/2017   Procedure: POSSIBLE OPEN  LAPAROTOMY;  Surgeon: Axel Filler, MD;  Location: [facility] OR;  Service: General;  Laterality: N/A;  . NO PAST SURGERIES    . ORIF TIBIA PLATEAU Left 04/15/2018   Procedure: OPEN REDUCTION INTERNAL FIXATION (ORIF) LEFT BICONDYLAR TIBIAL PLATEAU;  Surgeon: Tarry Kos, MD;  Location: [facility] OR;  Service: Orthopedics;  Laterality: Left;       Family History  Problem Relation Age of Onset  . Other Mother   . Throat cancer Mother   . Other Brother     Social History   Tobacco Use  . Smoking status: Current Some Day Smoker     Packs/day: 0.25    Years: 12.00    Pack years: 3.00    Types: Cigarettes  . Smokeless tobacco: Never Used  Vaping Use  . Vaping Use: Never used  Substance Use Topics  . Alcohol use: Not Currently  . Drug use: Not Currently    Types: Marijuana    Comment: denies use of marijuana     Home Medications Prior to Admission medications   Medication Sig Start Date End Date Taking? Authorizing Provider  benztropine (COGENTIN) 0.5 MG tablet Take 1 tablet (0.5 mg total) by mouth 2 (two) times daily. For eps 01/17/21   Armandina Stammer I, NP  fluticasone (FLONASE) 50 MCG/ACT nasal spray Place 2 sprays into both nostrils daily. For allergies 01/18/21   Armandina Stammer I, NP  haloperidol (HALDOL) 5 MG tablet Take 1 tablet (5 mg total) by mouth 2 (two) times daily. For mood control 01/17/21   Armandina Stammer I, NP  hydrOXYzine (ATARAX/VISTARIL) 50 MG tablet Take 1 tablet (50 mg total) by mouth 3 (three) times daily as needed for anxiety. 01/17/21   Armandina Stammer I, NP  metoprolol succinate (TOPROL-XL) 100 MG 24 hr tablet Take 1 tablet (100 mg total) by mouth daily. Take with or immediately following a meal: For high blood pressure 01/18/21   Nwoko, Nicole [name] I, NP  risperidone (RISPERDAL M-TABS) 3 MG disintegrating tablet Take 1 tablet (3 mg total) by mouth 2 (two) times daily. For mood control 01/17/21   Armandina Stammer I, NP  traZODone (  DESYREL) 100 MG tablet Take 1 tablet (100 mg total) by mouth at bedtime as needed for sleep. 01/17/21   Sanjuana Kava, NP    Allergies    Patient has no known allergies.  Review of Systems   Review of Systems  Constitutional: Negative for chills and fever.  HENT: Negative.   Respiratory: Negative.   Cardiovascular: Negative.   Gastrointestinal: Negative.   Musculoskeletal: Negative.   Skin: Negative.   Neurological: Negative.   Psychiatric/Behavioral: Positive for agitation and hallucinations.    Physical Exam Updated Vital Signs BP (!) 161/103 (BP Location: Left Arm)   Pulse  98   Temp 98.6 F (37 C) (Oral)   Resp 16   SpO2 100%   Physical Exam Vitals and nursing note reviewed.  Constitutional:      Appearance: He is well-developed.  Pulmonary:     Effort: Pulmonary effort is normal.  Musculoskeletal:        General: Normal range of motion.     Cervical back: Normal range of motion.  Skin:    General: Skin is warm and dry.  Neurological:     Mental Status: He is alert and oriented to person, place, and time.  Psychiatric:        Mood and Affect: Affect is angry.        Behavior: Behavior is agitated. Behavior is cooperative.     ED Results / Procedures / Treatments   Labs (all labs ordered are listed, but only abnormal results are displayed) Labs Reviewed - No data to display  EKG None  Radiology No results found.  Procedures Procedures   Medications Ordered in ED Medications - No data to display  ED Course  I have reviewed the triage vital signs and the nursing notes.  Pertinent labs & imaging results that were available during my care of the patient were reviewed by me and considered in my medical decision making (see chart for details).    MDM Rules/Calculators/A&P                          Patient to ED under IVC citing aggressive behavior, responding to internal stimuli.   Will get medical clearance and request TTS consultation for appropriate disposition.  7:20 - TTS eval still pending. Patient to continue to wait in the ED until disposition is decided on [facility] evaluation.   Final Clinical Impression(s) / ED Diagnoses Final diagnoses:  None   1. IVC 2. Violent behavior  Rx / DC Orders ED Discharge Orders    None       Danne Harbor 02/15/21 0962    Palumbo, April, MD 02/15/21 2313

## 2021-02-15 NOTE — ED Notes (Signed)
DINNER TRAY GIVEN °

## 2021-02-15 NOTE — ED Notes (Signed)
Patient asking for chips. This RN told pt he could have crackers, a sandwich, or soup. Pt stating "I dont want that bum ass food."

## 2021-02-15 NOTE — ED Provider Notes (Signed)
Behavioral Health Urgent Care Medical Screening Exam  Patient Name: [name] [name] MRN: [id number] Date of Evaluation: 02/15/21 Chief Complaint: Involuntary Comitted Diagnosis:  Final diagnoses:  Substance-induced psychotic disorder with delusions (HCC)    History of Present illness: [name] [name] is a 30 y.o. male brought in by [facility], patient was IVCed by his father due to aggressive behavior and auditory visual hallucinations. [name] [name] is a 30 y.o. male with a hx of schizophrenia, depression, and substance induced psychotic disorder who was brought to [facility] by [facility] under IVC due to continued aggression towards his father and hearing voices. Patient was very agitated and physically aggressive.   Psychiatric Specialty Exam  Presentation  General Appearance:Disheveled; Bizarre  Eye Contact:Fair  Speech:Clear and Coherent; Normal Rate  Speech Volume:Normal  Handedness:Right   Mood and Affect  Mood:Irritable  Affect:Congruent   Thought Process  Thought Processes: tangential and circumstantial  Descriptions of Associations: vague and guarded  Orientation:Full (Time, Place and Person)  Thought Content:  loose  Diagnosis of Schizophrenia or Schizoaffective disorder in past: Yes  Duration of Psychotic Symptoms: Greater than six months  Hallucinations:None  Ideas of Reference:None  Suicidal Thoughts:No  Homicidal Thoughts:No   Sensorium  Memory:Immediate Fair; Recent Fair; Remote Fair  Judgment:Fair  Insight:Fair   Executive Functions  Concentration:Poor   Attention Span:Poor  Recall:Poor   Fund of Knowledge:Fair  Language:Fair   Psychomotor Activity  Psychomotor Activity:Normal   Assets  Assets:Communication Skills; Desire for Improvement; Financial Resources/Insurance; Housing; Intimacy; Leisure Time; Physical Health; Resilience; Social Support; Talents/Skills; Transportation; Vocational/Educational   Sleep  Sleep: poor  Number  of hours: 3-4  No data recorded  Physical Exam: Physical Exam HENT:     Head: Normocephalic.     Nose: Nose normal.  Cardiovascular:     Rate and Rhythm: Normal rate.     Pulses: Normal pulses.  Pulmonary:     Effort: Pulmonary effort is normal.  Musculoskeletal:        General: Normal range of motion.  Neurological:     General: No focal deficit present.     Mental Status: He is alert.  Psychiatric:        Attention and Perception: He is inattentive.        Mood and Affect: Affect is labile and angry.        Speech: Speech is rapid and pressured and tangential.        Behavior: Behavior is uncooperative, agitated, aggressive and combative.        Thought Content: Thought content is not delusional. Thought content does not include homicidal or suicidal ideation. Thought content does not include homicidal or suicidal plan.        Cognition and Memory: He exhibits impaired recent memory and impaired remote memory.        Judgment: Judgment is impulsive.    Review of Systems  Constitutional: Negative.   HENT: Negative.   Eyes: Negative.   Respiratory: Negative.   Cardiovascular: Negative.   Gastrointestinal: Negative.   Neurological: Negative.   Psychiatric/Behavioral: The patient is nervous/anxious.    Blood pressure 121/76, pulse 100, temperature (!) 97.3 F (36.3 C), resp. rate 18, SpO2 100 %. There is no height or weight on file to calculate BMI.  Musculoskeletal: Strength & Muscle Tone: within normal limits and increased Gait & Station: normal Patient leans: N/A   [facility] MSE Discharge Disposition for Follow up and Recommendations: Based on my evaluation patient does not appear to have a medical emergency  Patient was transferred to [facility] ED for safety and crisis stabilization. Patient was transferred to [facility] ED due to aggressive behavior and acuity of the [facility]  Patient will be reassessed by TTS on 02/15/21.   Jasper Riling, NP 02/15/2021, 12:53 AM

## 2021-02-15 NOTE — ED Triage Notes (Signed)
Pt being IVC'd by father. Pt presented to [facility] but became too aggressive for staff to handle so they sent him here. Per IVC papers, pt is responding to internal stimuli and becomes aggressive easily. [employer] at bedside.

## 2021-02-15 NOTE — Consult Note (Addendum)
Telepsych Consultation   Reason for Consult:  IVC Referring Physician:  Melbourne Abts PA-C Location of Patient: [place] [facility] Location of Provider: Behavioral Health TTS Department  Patient Identification: [name] [name] MRN:  [id number] Principal Diagnosis: Substance-induced disorder [facility]) Diagnosis:  Principal Problem:   Substance-induced disorder (HCC)  Total Time spent with patient: 20 minutes  Subjective:   [name] [name] is a 30 y.o. male patient admitted with IVC.  "I'm really not too sure. The cops picked me up, I know me and my dad got into it".   Patient presents alert and oriented; calm and cooperative. Patient states he lives in his father's backyard where the two have a contentious relationship; states mom passed away. Endorses daily marijuana use; last use yesterday. UDS+ marijuana.   Patient denies any thoughts of wanting to harm himself or anyone else including his father; expressed that he never intended to be disrespectful but feels dad doesn't know how to speak to him and doesn't realize he's an adult and not a little kid anymore. He denies any history of developmental delays or special needs; none found is chart. Per chart review patient has history of substance induced psychotic disorder. He has no legal guardian and discussed his knowledge of how to access resources at [facility] and [facility] if he needs it; states he does not wish to seek medication management at this time. States plan to move to [place]. Provider discussed crisis management and services offered at [facility] if he changes his mind.   Patient denies having any active auditory or visual hallucinations. He does not appear paranoid or delusional and does not appear to be responding to any external/internal stimuli at this time. Per chart review, patient has history of substance-induced diagnoses and encounters; UDS+ for marijuana. No psychotic behaviors noted since admission to [facility]; patient refusing medications as  he states he doesn't wish to be medicated, otherwise patient noted as calm and compliant. He provided verbal consent to speak to father for collateral information and discharge planning; both TTS and provider attempted to contact dad with no answer. Patient contracts for safety and is requesting discharge at this time.   Collateral: [phone] [place] (dad): no answer  HPI:   [name] [name] is a 30 y.o. male brought in by [facility], patient was IVCed by his father due to aggressive behavior and auditory visual hallucinations. [name] [name] is a 30 y.o. male with a hx of schizophrenia, depression, and substance induced psychotic disorder who was brought to [facility] by [facility] under IVC due to continued aggression towards his father and hearing voices. On assessment to [facility] patient presented agitated and aggressive where he was then transferred to [facility] for crisis management and stabilization. UDS+marijuana; patient endorses last use yesterday.   Past Psychiatric History:   -substance induced psychotic disorder  -schizophrenia  -depression  Risk to Self:  pt denies Risk to Others:  pt denies Prior Inpatient Therapy:  yes Prior Outpatient Therapy:  no  Past Medical History:  Past Medical History:  Diagnosis Date   Schizophrenia (HCC)    Tibial plateau fracture, left     Past Surgical History:  Procedure Laterality Date   APPENDECTOMY     LAPAROSCOPIC APPENDECTOMY N/A 07/16/2017   Procedure: APPENDECTOMY LAPAROSCOPIC;  Surgeon: Axel Filler, MD;  Location: [facility] OR;  Service: General;  Laterality: N/A;   LAPAROTOMY N/A 07/16/2017   Procedure: POSSIBLE OPEN  LAPAROTOMY;  Surgeon: Axel Filler, MD;  Location: [facility] OR;  Service: General;  Laterality:  N/A;   NO PAST SURGERIES     ORIF TIBIA PLATEAU Left 04/15/2018   Procedure: OPEN REDUCTION INTERNAL FIXATION (ORIF) LEFT BICONDYLAR TIBIAL PLATEAU;  Surgeon: Tarry KosXu, Naiping M, MD;  Location: [facility] OR;  Service: Orthopedics;  Laterality: Left;    Family History:  Family History  Problem Relation Age of Onset   Other Mother    Throat cancer Mother    Other Brother    Family Psychiatric  History:   -twin brother: schizophrenia  Social History:  Social History   Substance and Sexual Activity  Alcohol Use Not Currently     Social History   Substance and Sexual Activity  Drug Use Not Currently   Types: Marijuana   Comment: denies use of marijuana     Social History   Socioeconomic History   Marital status: Single    Spouse name: Not on file   Number of children: Not on file   Years of education: Not on file   Highest education level: Not on file  Occupational History   Not on file  Tobacco Use   Smoking status: Current Some Day Smoker    Packs/day: 0.25    Years: 12.00    Pack years: 3.00    Types: Cigarettes   Smokeless tobacco: Never Used  Building services engineerVaping Use   Vaping Use: Never used  Substance and Sexual Activity   Alcohol use: Not Currently   Drug use: Not Currently    Types: Marijuana    Comment: denies use of marijuana    Sexual activity: Not Currently  Other Topics Concern   Not on file  Social History Narrative   Not on file   Social Determinants of Health   Financial Resource Strain: Not on file  Food Insecurity: Not on file  Transportation Needs: Not on file  Physical Activity: Not on file  Stress: Not on file  Social Connections: Not on file   Additional Social History:    Allergies:  No Known Allergies  Labs:  Results for orders placed or performed during the hospital encounter of 02/15/21 (from the past 48 hour(s))  Ethanol     Status: None   Collection Time: 02/15/21  1:25 AM  Result Value Ref Range   Alcohol, Ethyl (B) <10 <10 mg/dL    Comment: (NOTE) Lowest detectable limit for serum alcohol is 10 mg/dL.  For medical purposes only. Performed at [facility], [address] [place], [place] [address]   Salicylate level     Status: Abnormal   Collection  Time: 02/15/21  1:25 AM  Result Value Ref Range   Salicylate Lvl <7.0 (L) 7.0 - 30.0 mg/dL    Comment: Performed at [facility], [address] [place], [place] [address]  Acetaminophen level     Status: Abnormal   Collection Time: 02/15/21  1:25 AM  Result Value Ref Range   Acetaminophen (Tylenol), Serum <10 (L) 10 - 30 ug/mL    Comment: (NOTE) Therapeutic concentrations vary significantly. A range of 10-30 ug/mL  may be an effective concentration for many patients. However, some  are best treated at concentrations outside of this range. Acetaminophen concentrations >150 ug/mL at 4 hours after ingestion  and >50 ug/mL at 12 hours after ingestion are often associated with  toxic reactions.  Performed at [facility], [address] [place], [place] [address]   CBC with Differential     Status: Abnormal   Collection Time: 02/15/21  1:38 AM  Result Value Ref Range   WBC 11.5 (H) 4.0 - 10.5 K/uL   RBC 4.41 4.22 - 5.81 MIL/uL   Hemoglobin 12.8 (L) 13.0 - 17.0 g/dL   HCT 21.3 08.6 - 57.8 %   MCV 89.6 80.0 - 100.0 fL   MCH 29.0 26.0 - 34.0 pg   MCHC 32.4 30.0 - 36.0 g/dL   RDW 46.9 62.9 - 52.8 %   Platelets 324 150 - 400 K/uL   nRBC 0.0 0.0 - 0.2 %   Neutrophils Relative % 73 %   Neutro Abs 8.4 (H) 1.7 - 7.7 K/uL   Lymphocytes Relative 18 %   Lymphs Abs 2.1 0.7 - 4.0 K/uL   Monocytes Relative 7 %   Monocytes Absolute 0.8 0.1 - 1.0 K/uL   Eosinophils Relative 1 %   Eosinophils Absolute 0.1 0.0 - 0.5 K/uL   Basophils Relative 1 %   Basophils Absolute 0.1 0.0 - 0.1 K/uL   Immature Granulocytes 0 %   Abs Immature Granulocytes 0.05 0.00 - 0.07 K/uL    Comment: Performed at [facility], [address], [place], [place] [address]  Comprehensive metabolic panel     Status: Abnormal   Collection Time: 02/15/21  1:38 AM  Result Value Ref Range   Sodium 139 135 - 145 mmol/L   Potassium 3.9 3.5 - 5.1 mmol/L   Chloride 108 98  - 111 mmol/L   CO2 24 22 - 32 mmol/L   Glucose, Bld 97 70 - 99 mg/dL    Comment: Glucose reference range applies only to samples taken after fasting for at least 8 hours.   BUN 10 6 - 20 mg/dL   Creatinine, Ser 4.01 (L) 0.61 - 1.24 mg/dL   Calcium 9.0 8.9 - 02.7 mg/dL   Total Protein 7.4 6.5 - 8.1 g/dL   Albumin 4.4 3.5 - 5.0 g/dL   AST 25 15 - 41 U/L   ALT 39 0 - 44 U/L   Alkaline Phosphatase 151 (H) 38 - 126 U/L   Total Bilirubin 0.5 0.3 - 1.2 mg/dL   GFR, Estimated >25 >36 mL/min    Comment: (NOTE) Calculated using the CKD-EPI Creatinine Equation (2021)    Anion gap 7 5 - 15    Comment: Performed at [facility], [address], [place], [place] [address]  Resp Panel by RT-PCR (Flu A&B, Covid) Nasopharyngeal Swab     Status: None   Collection Time: 02/15/21  6:10 AM   Specimen: Nasopharyngeal Swab; Nasopharyngeal(NP) swabs in vial transport medium  Result Value Ref Range   SARS Coronavirus 2 by RT PCR NEGATIVE NEGATIVE    Comment: (NOTE) SARS-CoV-2 target nucleic acids are NOT DETECTED.  The SARS-CoV-2 RNA is generally detectable in upper respiratory specimens during the acute phase of infection. The lowest concentration of SARS-CoV-2 viral copies this assay can detect is 138 copies/mL. A negative result does not preclude SARS-Cov-2 infection and should not be used as the sole basis for treatment or other patient management decisions. A negative result may occur with  improper specimen collection/handling, submission of specimen other than nasopharyngeal swab, presence of viral mutation(s) within the areas targeted by this assay, and inadequate number of viral copies(<138 copies/mL). A negative result must be combined with clinical observations, patient history, and epidemiological information. The expected result is Negative.  Fact Sheet for Patients:  BloggerCourse.com  Fact Sheet for Healthcare Providers:   SeriousBroker.it  This test is no t yet approved or cleared  by the [place] and  has been authorized for detection and/or diagnosis of SARS-CoV-2 by FDA under an Emergency Use Authorization (EUA). This EUA will remain  in effect (meaning this test can be used) for the duration of the COVID-19 declaration under Section 564(b)(1) of the Act, 21 U.S.C.section 360bbb-3(b)(1), unless the authorization is terminated  or revoked sooner.       Influenza A by PCR NEGATIVE NEGATIVE   Influenza B by PCR NEGATIVE NEGATIVE    Comment: (NOTE) The Xpert Xpress SARS-CoV-2/FLU/RSV plus assay is intended as an aid in the diagnosis of influenza from Nasopharyngeal swab specimens and should not be used as a sole basis for treatment. Nasal washings and aspirates are unacceptable for Xpert Xpress SARS-CoV-2/FLU/RSV testing.  Fact Sheet for Patients: BloggerCourse.com  Fact Sheet for Healthcare Providers: SeriousBroker.it  This test is not yet approved or cleared by the [place] FDA and has been authorized for detection and/or diagnosis of SARS-CoV-2 by FDA under an Emergency Use Authorization (EUA). This EUA will remain in effect (meaning this test can be used) for the duration of the COVID-19 declaration under Section 564(b)(1) of the Act, 21 U.S.C. section 360bbb-3(b)(1), unless the authorization is terminated or revoked.  Performed at [facility], [address], [place], [place] [address]   Urine rapid drug screen (hosp performed)     Status: Abnormal   Collection Time: 02/15/21  9:15 AM  Result Value Ref Range   Opiates NONE DETECTED NONE DETECTED   Cocaine NONE DETECTED NONE DETECTED   Benzodiazepines NONE DETECTED NONE DETECTED   Amphetamines NONE DETECTED NONE DETECTED   Tetrahydrocannabinol POSITIVE (A) NONE DETECTED   Barbiturates NONE DETECTED NONE DETECTED    Comment:  (NOTE) DRUG SCREEN FOR MEDICAL PURPOSES ONLY.  IF CONFIRMATION IS NEEDED FOR ANY PURPOSE, NOTIFY LAB WITHIN 5 DAYS.  LOWEST DETECTABLE LIMITS FOR URINE DRUG SCREEN Drug Class                     Cutoff (ng/mL) Amphetamine and metabolites    1000 Barbiturate and metabolites    200 Benzodiazepine                 200 Tricyclics and metabolites     300 Opiates and metabolites        300 Cocaine and metabolites        300 THC                            50 Performed at [facility], [address], [place], [place] [address]     Medications:  Current Facility-Administered Medications  Medication Dose Route Frequency Provider Last Rate Last Admin   benztropine (COGENTIN) tablet 0.5 mg  0.5 mg Oral BID Upstill, Shari, PA-C       haloperidol (HALDOL) tablet 5 mg  5 mg Oral BID Upstill, Shari, PA-C       hydrOXYzine (ATARAX/VISTARIL) tablet 50 mg  50 mg Oral TID PRN Elpidio Anis, PA-C       OLANZapine zydis (ZYPREXA) disintegrating tablet 5 mg  5 mg Oral Q8H PRN Leevy-Johnson, Caitlynn Ju A, NP       And   LORazepam (ATIVAN) tablet 1 mg  1 mg Oral PRN Leevy-Johnson, Jisel Fleet A, NP       And   ziprasidone (GEODON) injection 20 mg  20 mg Intramuscular PRN Leevy-Johnson, Lina Sar, NP  metoprolol succinate (TOPROL-XL) 24 hr tablet 100 mg  100 mg Oral Daily Upstill, Shari, PA-C       risperiDONE (RISPERDAL M-TABS) disintegrating tablet 3 mg  3 mg Oral BID Upstill, Shari, PA-C       traZODone (DESYREL) tablet 100 mg  100 mg Oral QHS PRN Elpidio Anis, PA-C       Current Outpatient Medications  Medication Sig Dispense Refill   benztropine (COGENTIN) 0.5 MG tablet Take 1 tablet (0.5 mg total) by mouth 2 (two) times daily. For eps (Patient not taking: Reported on 02/15/2021) 60 tablet 0   fluticasone (FLONASE) 50 MCG/ACT nasal spray Place 2 sprays into both nostrils daily. For allergies (Patient not taking: Reported on 02/15/2021)  2   haloperidol (HALDOL) 5 MG tablet Take 1  tablet (5 mg total) by mouth 2 (two) times daily. For mood control (Patient not taking: Reported on 02/15/2021) 60 tablet 0   hydrOXYzine (ATARAX/VISTARIL) 50 MG tablet Take 1 tablet (50 mg total) by mouth 3 (three) times daily as needed for anxiety. (Patient not taking: Reported on 02/15/2021) 75 tablet 0   metoprolol succinate (TOPROL-XL) 100 MG 24 hr tablet Take 1 tablet (100 mg total) by mouth daily. Take with or immediately following a meal: For high blood pressure (Patient not taking: Reported on 02/15/2021) 30 tablet 0   risperidone (RISPERDAL M-TABS) 3 MG disintegrating tablet Take 1 tablet (3 mg total) by mouth 2 (two) times daily. For mood control (Patient not taking: Reported on 02/15/2021) 60 tablet 0   traZODone (DESYREL) 100 MG tablet Take 1 tablet (100 mg total) by mouth at bedtime as needed for sleep. (Patient not taking: Reported on 02/15/2021) 30 tablet 0   Musculoskeletal: Strength & Muscle Tone: within normal limits Gait & Station: normal Patient leans: N/A  Psychiatric Specialty Exam: Physical Exam Vitals and nursing note reviewed.  Constitutional:      General: He is not in acute distress.    Appearance: He is obese. He is not ill-appearing, toxic-appearing or diaphoretic.  HENT:     Head: Normocephalic.  Neurological:     Mental Status: He is alert. Mental status is at baseline.  Psychiatric:        Attention and Perception: Attention and perception normal.        Mood and Affect: Affect is blunt. Affect is not labile or angry.        Speech: Speech normal. Speech is not rapid and pressured or tangential.        Behavior: Behavior normal. Behavior is cooperative.        Thought Content: Thought content normal. Thought content is not paranoid or delusional. Thought content does not include homicidal or suicidal ideation. Thought content does not include homicidal or suicidal plan.        Cognition and Memory: Memory normal.        Judgment: Judgment normal.    Review of  Systems  Psychiatric/Behavioral: Negative.   All other systems reviewed and are negative.   Blood pressure 133/80, pulse 65, temperature 98.6 F (37 C), temperature source Oral, resp. rate 18, SpO2 97 %.There is no height or weight on file to calculate BMI.  General Appearance: Casual  Eye Contact:  Fair  Speech:  Clear and Coherent  Volume:  Normal  Mood:  Euthymic  Affect:  Congruent  Thought Process:  Goal Directed  Orientation:  Full (Time, Place, and Person)  Thought Content:  Logical  Suicidal Thoughts:  No  Homicidal  Thoughts:  No  Memory:  Immediate;   Fair Recent;   Fair  Judgement:  Fair  Insight:  Fair and Shallow  Psychomotor Activity:  Normal  Concentration:  Concentration: Fair and Attention Span: Fair  Recall:  [employer] of Knowledge:  Fair  Language:  Good  Akathisia:  NA  Handed:    AIMS (if indicated):     Assets:  Communication Skills Physical Health Resilience  ADL's:  Intact  Cognition:  WNL  Sleep:      Treatment Plan Summary: Plan discharge patient home with outpatient resources for individual follow-up.   Disposition: No evidence of imminent risk to self or others at present.   Patient does not meet criteria for psychiatric inpatient admission. Supportive therapy provided about ongoing stressors. Discussed crisis plan, support from social network, calling 911, coming to the Emergency Department, and calling Suicide Hotline.  This service was provided via telemedicine using a 2-way, interactive audio and video technology.  Names of all persons participating in this telemedicine service and their role in this encounter. Name: Maxie Barb Role: PMHNP  Name: Nelly Rout Role: Attending Physician  Name: [name] [name] Role: patient  Name:  Role:     Loletta Parish, NP 02/15/2021 11:10 AM

## 2021-02-15 NOTE — ED Provider Notes (Signed)
[name] [name] has been seen and assessed by behavioral health.  They recommend discharge home.   Koleen Distance, MD 02/15/21 [phone]

## 2021-02-15 NOTE — ED Notes (Signed)
IVC rescinded at this time, pt given his belongings and verbalized understanding with discharge instructions.

## 2021-02-15 NOTE — ED Notes (Signed)
Two labeled patient belongings bags and IVC paperwork transferred with patient. Belongings secured in cabinet at nurse's station.

## 2021-02-15 NOTE — ED Notes (Signed)
Report called to Leta Jungling, RN at [facility] ED.

## 2021-02-15 NOTE — Discharge Instructions (Addendum)
Patient to transferred [facility] for observation.

## 2021-02-15 NOTE — ED Notes (Signed)
Patient discharged to [facility] ED.  Patient ambulatory.  Transported by [facility] with IVC.  Discharged in stable condition.  No acute distress noted.

## 2021-02-15 NOTE — ED Notes (Signed)
Patient refusing all medications. Offered PRN for anxiety. Patient also refused.

## 2021-06-18 ENCOUNTER — Other Ambulatory Visit (HOSPITAL_COMMUNITY): Payer: Self-pay

## 2021-06-18 ENCOUNTER — Other Ambulatory Visit: Payer: Self-pay

## 2021-06-18 ENCOUNTER — Ambulatory Visit (HOSPITAL_COMMUNITY)
Admission: EM | Admit: 2021-06-18 | Discharge: 2021-06-18 | Disposition: A | Discharge status: Home / Self Care | Payer: No Payment, Other | Attending: Family | Admitting: Family

## 2021-06-18 DIAGNOSIS — Z91128 Patient's intentional underdosing of medication regimen for other reason: Secondary | ICD-10-CM | POA: Insufficient documentation

## 2021-06-18 DIAGNOSIS — F419 Anxiety disorder, unspecified: Secondary | ICD-10-CM | POA: Insufficient documentation

## 2021-06-18 DIAGNOSIS — F203 Undifferentiated schizophrenia: Secondary | ICD-10-CM | POA: Insufficient documentation

## 2021-06-18 MED ORDER — HALOPERIDOL 5 MG PO TABS: 5.0000 mg | ORAL_TABLET | Freq: Two times a day (BID) | ORAL | 0 refills | Status: DC

## 2021-06-18 MED ORDER — HALOPERIDOL 5 MG PO TABS
5.0000 mg | ORAL_TABLET | Freq: Two times a day (BID) | ORAL | 0 refills | Status: DC
  Filled 2021-06-18: qty 60, 30d supply, fill #0

## 2021-06-18 MED ORDER — BENZTROPINE MESYLATE 0.5 MG PO TABS
0.5000 mg | ORAL_TABLET | Freq: Two times a day (BID) | ORAL | 0 refills | Status: DC
  Filled 2021-06-18: qty 60, 30d supply, fill #0

## 2021-06-18 MED ORDER — BENZTROPINE MESYLATE 0.5 MG PO TABS: 0.5000 mg | ORAL_TABLET | Freq: Two times a day (BID) | ORAL | 0 refills | Status: DC

## 2021-06-18 NOTE — BH Assessment (Signed)
Pt is routine. Reported AVH to [employer]. Reports hallucinations is keeping him up at night preventing him from sleeping. Pt denies SI, HI, AVH. Pt not taking medications and does not have outpatient services. [facility] staff is with him and reports that he can return to shelter. Pt was brought by [employer] because staff could not transport pt here. Staff denies pt being aggressive.

## 2021-06-18 NOTE — ED Provider Notes (Signed)
Behavioral Health Urgent Care Medical Screening Exam  Patient Name: [name] [name] MRN: [id number] Date of Evaluation: 06/18/21 Chief Complaint:   Diagnosis:  Final diagnoses:  Undifferentiated schizophrenia (HCC)    History of Present illness: [name] [name] is a 30 y.o. male.  Patient presents voluntarily to [facility] behavioral health for walk-in assessment. Patient states "I have anxiety, I would really like to learn how to control my anxiety."  Patient reports recent stressors include "trying to get my mind together as to what I am going to do next."  [name] is accompanied by, Mack Hook,  social worker from [employer].  He requests that she remain present during assessment.  Patient is assessed face-to-face by nurse practitioner.  He is seated in assessment area, no acute distress.  He is alert and oriented, pleasant and cooperative during assessment.  He reports anxious mood, appropriate affect.  He denies suicidal and homicidal ideations.  He denies any history of suicide attempts, denies history of self-harm.  He contracts verbally for safety with this Clinical research associate.   He has normal speech and behavior.  He denies both auditory and visual hallucinations.  Patient is able to converse coherently with goal-directed thoughts and no distractibility or preoccupation.  He denies paranoia.  Objectively there is no evidence of psychosis/mania or delusional thinking.  Patient has been diagnosed with schizophrenia.  He reports he is not currently followed by outpatient psychiatry.  He denies any current medications.  He reports "I stopped taking the medication because I was doing fine without it."  Patient reports he would be willing to follow-up with outpatient psychiatry states "I would really like to learn to control my anxiety."  Discussed medications including haloperidol and Cogentin, patient agrees to restart cogentin and haldol.  [name] currently resides in [place] at  the [employer].  He is seeking employment through a temporary agency.  He denies alcohol and use.  He endorses average sleep and appetite.  Patient offered support and encouragement. Mack Hook, social worker with [employer], shares that [name] reported to her team earlier that he "heard multiple voices."  She agrees with plan to restart medications and follow-up with outpatient psychiatry.   Psychiatric Specialty Exam  Presentation  General Appearance:Appropriate for Environment; Casual  Eye Contact:Fair  Speech:Clear and Coherent; Normal Rate  Speech Volume:Normal  Handedness:Right   Mood and Affect  Mood:Euthymic  Affect:Appropriate; Congruent   Thought Process  Thought Processes:Coherent; Goal Directed; Linear  Descriptions of Associations:Intact  Orientation:Full (Time, Place and Person)  Thought Content:Logical  Diagnosis of Schizophrenia or Schizoaffective disorder in past: Yes  Duration of Psychotic Symptoms: Greater than six months  Hallucinations:None  Ideas of Reference:None  Suicidal Thoughts:No  Homicidal Thoughts:No   Sensorium  Memory:Immediate Good; Recent Good; Remote Good  Judgment:Fair  Insight:Fair   Executive Functions  Concentration:Good  Attention Span:Good  Recall:Good  Fund of Knowledge:Good  Language:Good   Psychomotor Activity  Psychomotor Activity:Normal   Assets  Assets:Communication Skills; Desire for Improvement; Financial Resources/Insurance; Housing; Intimacy; Leisure Time; Physical Health; Resilience; Social Support   Sleep  Sleep:Good  Number of hours: 6.5   No data recorded  Physical Exam: Physical Exam Vitals and nursing note reviewed.  Constitutional:      Appearance: Normal appearance. He is well-developed. He is obese.  HENT:     Head: Normocephalic and atraumatic.     Nose: Nose normal.  Cardiovascular:     Rate and Rhythm: Normal rate.  Pulmonary:  Effort: Pulmonary  effort is normal.  Musculoskeletal:        General: Normal range of motion.     Cervical back: Normal range of motion.  Skin:    General: Skin is warm and dry.  Neurological:     Mental Status: He is alert and oriented to person, place, and time.  Psychiatric:        Attention and Perception: Attention and perception normal.        Mood and Affect: Affect normal. Mood is anxious.        Speech: Speech normal.        Behavior: Behavior normal. Behavior is cooperative.        Thought Content: Thought content normal.        Cognition and Memory: Cognition and memory normal.        Judgment: Judgment normal.   Review of Systems  Constitutional: Negative.   HENT: Negative.    Eyes: Negative.   Respiratory: Negative.    Cardiovascular: Negative.   Gastrointestinal: Negative.   Genitourinary: Negative.   Musculoskeletal: Negative.   Skin: Negative.   Neurological: Negative.   Endo/Heme/Allergies: Negative.   Psychiatric/Behavioral:  The patient is nervous/anxious.   Blood pressure 131/66, pulse 85, temperature 98.4 F (36.9 C), temperature source Oral, resp. rate 18, SpO2 99 %. There is no height or weight on file to calculate BMI.  Musculoskeletal: Strength & Muscle Tone: within normal limits Gait & Station: normal Patient leans: N/A   [facility] MSE Discharge Disposition for Follow up and Recommendations: Based on my evaluation the patient does not appear to have an emergency medical condition and can be discharged with resources and follow up care in outpatient services for Medication Management and Individual Therapy Patient reviewed with Dr. Bronwen Betters. Medications: -Benztropine 0.5 mg twice daily -Haloperidol 5 mg twice daily Follow-up with outpatient psychiatry resources provided.   Lenard Lance, FNP 06/18/2021, 1:59 PM

## 2021-06-18 NOTE — Discharge Summary (Signed)
[name] [name] to be D/C'd Home per FNP order. Discussed with the patient and all questions fully answered. An After Visit Summary was printed and given to the patient. Patient escorted out, and D/C home via private auto.  [name] [name]  06/18/2021 2:00 PM

## 2021-06-18 NOTE — Discharge Instructions (Signed)

## 2021-06-26 ENCOUNTER — Telehealth (HOSPITAL_COMMUNITY): Payer: Self-pay | Admitting: Nurse Practitioner

## 2021-06-26 NOTE — BH Assessment (Signed)
Care Management - Follow Up [facility] Discharges   Writer made contact with the patient.   Writer provided patient with the following resources:       Clinical research associate informed patient that she is able to come to [facility] without an appointment Mon thru Thurs at [facility] stressed that these appointments are first come first serve.

## 2023-02-01 ENCOUNTER — Other Ambulatory Visit: Payer: Self-pay

## 2023-02-01 ENCOUNTER — Emergency Department (HOSPITAL_COMMUNITY): Payer: Medicaid Other

## 2023-02-01 ENCOUNTER — Emergency Department (HOSPITAL_COMMUNITY)
Admission: EM | Admit: 2023-02-01 | Discharge: 2023-02-01 | Disposition: A | Discharge status: Home / Self Care | Payer: Medicaid Other | Attending: Emergency Medicine | Admitting: Emergency Medicine

## 2023-02-01 DIAGNOSIS — M549 Dorsalgia, unspecified: Secondary | ICD-10-CM | POA: Diagnosis present

## 2023-02-01 MED ORDER — ACETAMINOPHEN 500 MG PO TABS: 500.0000 mg | ORAL_TABLET | Freq: Four times a day (QID) | ORAL | 0 refills | Status: DC | PRN

## 2023-02-01 MED ORDER — ACETAMINOPHEN 500 MG PO TABS
1000.0000 mg | ORAL_TABLET | Freq: Once | ORAL | Status: AC
  Administered 2023-02-01: 1000 mg via ORAL
  Filled 2023-02-01: qty 2

## 2023-02-01 MED ORDER — KETOROLAC TROMETHAMINE 15 MG/ML IJ SOLN
15.0000 mg | Freq: Once | INTRAMUSCULAR | Status: AC
  Administered 2023-02-01: 15 mg via INTRAMUSCULAR
  Filled 2023-02-01: qty 1

## 2023-02-01 NOTE — ED Triage Notes (Signed)
Pt BIB EMS c/o lower back pain.

## 2023-02-01 NOTE — ED Provider Notes (Signed)
[name] AT [facility] Provider Note   CSN: [id number] Arrival date & time: 02/01/23  6213     History No chief complaint on file.   HPI [name] [name] is a 32 y.o. male presenting for jaundice continues to have back pain.  32 year old male states that he woke up with back pain this morning.  Difficulty with walking.  Sitting upright in bed watching TV on my arrival to the room no acute distress.  Denies fevers chills nausea vomiting syncope shortness of breath.  Denies any incontinence, ferocity numbness tingling, gait abnormalities.  States it hurts when he walks..   Patient's recorded medical, surgical, social, medication list and allergies were reviewed in the Snapshot window as part of the initial history.   Review of Systems   Review of Systems  Constitutional:  Negative for chills and fever.  HENT:  Negative for ear pain and sore throat.   Eyes:  Negative for pain and visual disturbance.  Respiratory:  Negative for cough and shortness of breath.   Cardiovascular:  Negative for chest pain and palpitations.  Gastrointestinal:  Negative for abdominal pain and vomiting.  Genitourinary:  Negative for dysuria and hematuria.  Musculoskeletal:  Positive for back pain. Negative for arthralgias.  Skin:  Negative for color change and rash.  Neurological:  Negative for seizures and syncope.  All other systems reviewed and are negative.   Physical Exam Updated Vital Signs BP (!) 127/93 (BP Location: Left Arm)   Pulse 73   Temp 98 F (36.7 C) (Oral)   Resp 18   Ht 5\' 6"  (1.676 m)   Wt 101 kg   BMI 35.94 kg/m  Physical Exam Vitals and nursing note reviewed.  Constitutional:      General: He is not in acute distress.    Appearance: He is well-developed.  HENT:     Head: Normocephalic and atraumatic.  Eyes:     Conjunctiva/sclera: Conjunctivae normal.  Cardiovascular:     Rate and Rhythm: Normal rate and regular rhythm.     Heart  sounds: No murmur heard. Pulmonary:     Effort: Pulmonary effort is normal. No respiratory distress.     Breath sounds: Normal breath sounds.  Abdominal:     Palpations: Abdomen is soft.     Tenderness: There is no abdominal tenderness.  Musculoskeletal:        General: No swelling.     Cervical back: Neck supple.  Skin:    General: Skin is warm and dry.     Capillary Refill: Capillary refill takes less than 2 seconds.  Neurological:     Mental Status: He is alert.  Psychiatric:        Mood and Affect: Mood normal.      ED Course/ Medical Decision Making/ A&P    Procedures Procedures   Medications Ordered in ED Medications  acetaminophen (TYLENOL) tablet 1,000 mg (1,000 mg Oral Given 02/01/23 0829)  ketorolac (TORADOL) 15 MG/ML injection 15 mg (15 mg Intramuscular Given 02/01/23 0830)   Medical Decision Making:   CRIAG FURNISH is a 32 y.o. male who presented to the ED today with acute lower back pain over the past 2 hours, detailed above.    Patient's presentation is complicated by their history of obesity.  Patient placed on continuous vitals and telemetry monitoring while in ED which was reviewed periodically.   On my initial exam, the pt was with an intact neurologic exam, tolerating ambulation with an  antalgic gait and p.o. intake without difficulty.  Patient had no abnormal DTRs, no midline spinal tenderness.  Patient endorsing complete sensation of the perineum.  Patient without episodes of fecal or urinary incontinence.  Patient has no focal neurologic deficits and reassuring vital signs at this time.  No obvious physical abnormality or injury on exam. Notably, patient denies recent trauma, is afebrile, and denies IVDU.   Reviewed and confirmed nursing documentation for past medical history, family history, social history.    Initial Assessment:   With the patient's presentation of acute back pain in the above setting, most likely diagnosis is musculoskeletal  strain. Other diagnoses were considered including (but not limited to) underlying fracture, epidural hematoma, cauda equina syndrome, spinal stenosis, spinal malignancy. These are considered less likely due to history of present illness and physical exam findings.   In particular, lack of fever, substantial history of IV drug use, or substantial neurologic abnormality is less consistent with epidural abscess versus discitis or other spinal infection. In particular,  Initial Plan:  Multimodal pain control described and patient informed on safe usage.  Screening evaluation including below radiographic evaluation reviewed and grossly unremarkable at this time. Patient stable for continued outpatient evaluation and management of their musculoskeletal pains.  Patient referred back to primary care provider for continued evaluation and management.   Initial Study Results:   Radiology  DG Lumbar Spine Complete  Final Result       Disposition:   Based on the above findings, I believe patient is stable for discharge.    Patient and family educated about specific return precautions for given chief complaint and symptoms.  Patient and family educated about follow-up with PCP.  Patient and family expressed understanding of return precautions and need for follow-up. Patient spoken to regarding all imaging and laboratory results and appropriate follow up for these results. All education provided in verbal and written form and time was allowed for answering of patient questions. Patient discharged.          Emergency Department Medication Summary:   Medications  acetaminophen (TYLENOL) tablet 1,000 mg (1,000 mg Oral Given 02/01/23 0829)  ketorolac (TORADOL) 15 MG/ML injection 15 mg (15 mg Intramuscular Given 02/01/23 0830)      Clinical Impression:  1. Acute back pain, unspecified back location, unspecified back pain laterality      Data Unavailable   Final Clinical Impression(s) / ED  Diagnoses Final diagnoses:  Acute back pain, unspecified back location, unspecified back pain laterality    Rx / DC Orders ED Discharge Orders          Ordered    acetaminophen (TYLENOL) 500 MG tablet  Every 6 hours PRN        02/01/23 1610              Glyn Ade, MD 02/01/23 [phone]

## 2024-03-06 ENCOUNTER — Ambulatory Visit (HOSPITAL_COMMUNITY)
Admission: EM | Admit: 2024-03-06 | Discharge: 2024-03-07 | Disposition: A | Discharge status: Other Acute Inpatient Hospital | Payer: MEDICAID | Attending: Family Medicine | Admitting: Family Medicine

## 2024-03-06 DIAGNOSIS — F203 Undifferentiated schizophrenia: Secondary | ICD-10-CM | POA: Diagnosis not present

## 2024-03-06 LAB — CBC WITH DIFFERENTIAL/PLATELET
Abs Immature Granulocytes: 0.05 10*3/uL (ref 0.00–0.07)
Basophils Absolute: 0.1 10*3/uL (ref 0.0–0.1)
Basophils Relative: 1 %
Eosinophils Absolute: 0.1 10*3/uL (ref 0.0–0.5)
Eosinophils Relative: 1 %
HCT: 38.2 % — ABNORMAL LOW (ref 39.0–52.0)
Hemoglobin: 13.2 g/dL (ref 13.0–17.0)
Immature Granulocytes: 1 %
Lymphocytes Relative: 22 %
Lymphs Abs: 2 10*3/uL (ref 0.7–4.0)
MCH: 29.6 pg (ref 26.0–34.0)
MCHC: 34.6 g/dL (ref 30.0–36.0)
MCV: 85.7 fL (ref 80.0–100.0)
Monocytes Absolute: 0.8 10*3/uL (ref 0.1–1.0)
Monocytes Relative: 9 %
Neutro Abs: 5.8 10*3/uL (ref 1.7–7.7)
Neutrophils Relative %: 66 %
Platelets: 360 10*3/uL (ref 150–400)
RBC: 4.46 MIL/uL (ref 4.22–5.81)
RDW: 12.8 % (ref 11.5–15.5)
WBC: 8.9 10*3/uL (ref 4.0–10.5)
nRBC: 0 % (ref 0.0–0.2)

## 2024-03-06 LAB — COMPREHENSIVE METABOLIC PANEL WITH GFR
ALT: 45 U/L — ABNORMAL HIGH (ref 0–44)
AST: 36 U/L (ref 15–41)
Albumin: 4.2 g/dL (ref 3.5–5.0)
Alkaline Phosphatase: 126 U/L (ref 38–126)
Anion gap: 10 (ref 5–15)
BUN: 13 mg/dL (ref 6–20)
CO2: 23 mmol/L (ref 22–32)
Calcium: 9.2 mg/dL (ref 8.9–10.3)
Chloride: 103 mmol/L (ref 98–111)
Creatinine, Ser: 0.69 mg/dL (ref 0.61–1.24)
GFR, Estimated: >60 mL/min (ref >60)
Glucose, Bld: 94 mg/dL (ref 70–99)
Potassium: 3.2 mmol/L — ABNORMAL LOW (ref 3.5–5.1)
Sodium: 136 mmol/L (ref 135–145)
Total Bilirubin: 1 mg/dL (ref 0.0–1.2)
Total Protein: 7.5 g/dL (ref 6.5–8.1)

## 2024-03-06 LAB — LIPID PANEL
Cholesterol: 182 mg/dL (ref 0–200)
HDL: 37 mg/dL — ABNORMAL LOW (ref >40)
LDL Cholesterol: 132 mg/dL — ABNORMAL HIGH (ref 0–99)
Total CHOL/HDL Ratio: 4.9 ratio
Triglycerides: 67 mg/dL (ref <150)
VLDL: 13 mg/dL (ref 0–40)

## 2024-03-06 LAB — HEMOGLOBIN A1C
Hgb A1c MFr Bld: 5.3 % (ref 4.8–5.6)
Mean Plasma Glucose: 105.41 mg/dL

## 2024-03-06 LAB — TSH: TSH: 1.921 u[IU]/mL (ref 0.350–4.500)

## 2024-03-06 LAB — ETHANOL: Alcohol, Ethyl (B): <15 mg/dL (ref <15)

## 2024-03-06 MED ORDER — TRAZODONE HCL 50 MG PO TABS: 50.0000 mg | ORAL_TABLET | Freq: Every evening | ORAL | Status: DC | PRN

## 2024-03-06 MED ORDER — ACETAMINOPHEN 325 MG PO TABS: 650.0000 mg | ORAL_TABLET | Freq: Four times a day (QID) | ORAL | Status: DC | PRN

## 2024-03-06 MED ORDER — POTASSIUM CHLORIDE CRYS ER 20 MEQ PO TBCR: 40.0000 meq | EXTENDED_RELEASE_TABLET | Freq: Once | ORAL | Status: DC

## 2024-03-06 MED ORDER — LORAZEPAM 2 MG/ML IJ SOLN
2.0000 mg | Freq: Three times a day (TID) | INTRAMUSCULAR | Status: DC | PRN
  Administered 2024-03-06: 2 mg via INTRAMUSCULAR

## 2024-03-06 MED ORDER — OLANZAPINE 5 MG PO TBDP
5.0000 mg | ORAL_TABLET | Freq: Two times a day (BID) | ORAL | Status: DC
  Administered 2024-03-06 – 2024-03-07 (×2): 5 mg via ORAL
  Filled 2024-03-06 (×2): qty 1

## 2024-03-06 MED ORDER — MAGNESIUM HYDROXIDE 400 MG/5ML PO SUSP: 30.0000 mL | Freq: Every day | ORAL | Status: DC | PRN

## 2024-03-06 MED ORDER — HALOPERIDOL 5 MG PO TABS
5.0000 mg | ORAL_TABLET | Freq: Three times a day (TID) | ORAL | Status: DC | PRN
  Administered 2024-03-06: 5 mg via ORAL
  Filled 2024-03-06: qty 1

## 2024-03-06 MED ORDER — ALUM & MAG HYDROXIDE-SIMETH 200-200-20 MG/5ML PO SUSP: 30.0000 mL | ORAL | Status: DC | PRN

## 2024-03-06 MED ORDER — DIPHENHYDRAMINE HCL 50 MG PO CAPS
50.0000 mg | ORAL_CAPSULE | Freq: Three times a day (TID) | ORAL | Status: DC | PRN
Start: 2024-03-06 — End: 2024-03-07
  Administered 2024-03-06: 50 mg via ORAL
  Filled 2024-03-06: qty 1

## 2024-03-06 MED ORDER — LORAZEPAM 2 MG/ML IJ SOLN
2.0000 mg | Freq: Three times a day (TID) | INTRAMUSCULAR | Status: DC | PRN
  Filled 2024-03-06: qty 1

## 2024-03-06 MED ORDER — DIPHENHYDRAMINE HCL 50 MG/ML IJ SOLN
50.0000 mg | Freq: Three times a day (TID) | INTRAMUSCULAR | Status: DC | PRN
  Administered 2024-03-06: 50 mg via INTRAMUSCULAR

## 2024-03-06 MED ORDER — DIPHENHYDRAMINE HCL 50 MG/ML IJ SOLN
50.0000 mg | Freq: Three times a day (TID) | INTRAMUSCULAR | Status: DC | PRN
Start: 2024-03-06 — End: 2024-03-07
  Filled 2024-03-06: qty 1

## 2024-03-06 MED ORDER — HALOPERIDOL LACTATE 5 MG/ML IJ SOLN
10.0000 mg | Freq: Three times a day (TID) | INTRAMUSCULAR | Status: DC | PRN
  Administered 2024-03-06: 10 mg via INTRAMUSCULAR
  Filled 2024-03-06: qty 2

## 2024-03-06 MED ORDER — HALOPERIDOL LACTATE 5 MG/ML IJ SOLN: 5.0000 mg | Freq: Three times a day (TID) | INTRAMUSCULAR | Status: DC | PRN

## 2024-03-06 MED ORDER — HYDROXYZINE HCL 25 MG PO TABS: 25.0000 mg | ORAL_TABLET | Freq: Three times a day (TID) | ORAL | Status: DC | PRN

## 2024-03-06 NOTE — Progress Notes (Addendum)
 Pt was transferred to the unit via WC due to sedation. Pt was assisted in to bed-recliner and is presently asleep. Respirations are even and unlabored.  No signs of acute distress noted. Pt is malodorous. Unable to complete admission assessment at this time. Staff will monitor for pt's safety.

## 2024-03-06 NOTE — ED Notes (Signed)
 Patient remains paranoid pertaining to not wanting to eat/drink and began refusing medication. Nursing was able to encourage patient to take his Zyprexa  and agitation medications PO as patient is visibly anxious and has an aggressive stance and loud commanding voice dominating conversation. Continuing to monitor.
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
## 2024-03-06 NOTE — Progress Notes (Signed)
Pt continues to sleep. Respirations are even and unlabored. No signs of acute distress noted. Staff will monitor for pt's safety.
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
## 2024-03-06 NOTE — BH Assessment (Signed)
 Comprehensive Clinical Assessment (CCA) Note  03/06/2024 [name] [name] [id number]  Chief Complaint: Patient was brought to the [facility] escorted by [facility]. During triage it was reported patient stated, I am hearing voices and she is telling me to kill myself. During the assessment with the NP patient was muted only stating, I want to kill.' He was able to state his age, then refused to speak. Patient appears to be responding to internal stimuli Per chart review patient was last seen at the [facility] 06/18/2021 for anxiety and has a history of Schizophrenia. Per chart review patient from 2022 patient denied history of suicidal/homicidal ideations. Had denied history of suicide attempts, denied history of self-arm. TTS attempted to call emergency numbers on file [name] [name], [name] [name], [phone] and [name] [name], father, [phone], no answer. After being seen by TTS and NP patient throw an object which resulted in a hole in the wall. Patient is being IVC'd and transported to the ED.   Chief Complaint  Patient presents with   Altered Mental Status   Visit Diagnosis: Schizophrenia   CCA Screening, Triage and Referral (STR)  Patient Reported Information How did you hear about us ? Legal System  What Is the Reason for Your Visit/Call Today? [name] presents to [facility] escorted by [employer]. Pt appears to be paranoid throughout triage and states, I am hearing voices and she is telling me to kill myself. Pt states that objects are talking to him, such as his fridge. Pt is fidgety, cooperative and quiet throughout triage. Pt is unable to specify if he is using substances at this time. Pt does have a hx of delusional thinking and a schizophrenia diagnosis. 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 Pt denies Si, Hi.  How Long Has This Been Causing You Problems? <Week  What Do You Feel Would Help You the Most Today? Medication(s)   Have You Recently Had Any Thoughts About Hurting Yourself? No  Are You  Planning to Commit Suicide/Harm Yourself At This time? No   Flowsheet Row ED from 03/06/2024 in [facility] ED from 02/01/2023 in [facility] Emergency Department at [facility]Dis[facility]  C-SSRS RISK CATEGORY No Risk No Risk High Risk    Have you Recently Had Thoughts About Hurting Someone [name] [name]? No  Are You Planning to Harm Someone at This Time? No  Explanation:   Chief Complaint  Patient presents with   Altered Mental Status   Have You Used Any Alcohol or Drugs in the Past 24 Hours? No  How Long Ago Did You Use Drugs or Alcohol?  What Did You Use and How Much? Altered Mental Status Do You Currently Have a Therapist/Psychiatrist? Altered mental status Name of Therapist/Psychiatrist: Altered mental status  Have You Been Recently Discharged From Any Office Practice or Programs? Altered Mental Status Explanation of Discharge From Practice/Program: Altered Mental Status    CCA Screening Triage Referral Assessment Type of Contact: Face-to-face Telemedicine Service Delivery: none reported Is this Initial or Reassessment? initial Date Telepsych consult ordered in [facility]:  None reported Time Telepsych consult ordered in [facility]: none reported  Location of Assessment: [facility] Provider Location: [facility]  Collateral Involvement: attempted, no answer   Does Patient Have a Court Appointed Legal Guardian? None reported Legal Guardian Contact Information: none reported Copy of Legal Guardianship Form: none reported Legal Guardian Notified of Arrival: none reported Legal Guardian Notified of Pending Discharge: none reported If Minor and  Not Living with Parent(s), Who has Custody? None reported Is CPS involved or ever been involved? None reported Is APS involved or ever been involved? None reported  Patient Determined To Be At Risk for Harm To Self or Others Based on Review of  Patient  Reported Information or Presenting Complaint? delusional Method: none reported Availability of Means: none reported Intent: none reported Notification Required:  none reported Additional Information for Danger to Others Potential: none reported Additional Comments for Danger to Others Potential: none reported Are There Guns or Other Weapons in Your Home? None reported Types of Guns/Weapons: none reported Are These Weapons Safely Secured?                            None reported Who Could Verify You Are Able To Have These Secured: none reported Do You Have any Outstanding Charges, Pending Court Dates, Parole/Probation? None reported Contacted To Inform of Risk of Harm To Self or Others: none reported   Does Patient Present under Involuntary Commitment? Patient came to [facility] voluntary, however, will be IVC'd and transported to the ED   [place] of Residence: [place]  Patient Currently Receiving the Following Services: none reported  Determination of Need: Urgent (48 hours)   Options For Referral: Medication Management; Inpatient Hospitalization     CCA Biopsychosocial Patient Reported Schizophrenia/Schizoaffective Diagnosis in Past: Yes (History of schizophrenia disorder)   Strengths: UTA   Mental Health Symptoms Depression:  None (per chart review no history of depression)   Duration of Depressive symptoms:    Mania:  Irritability (client threw furniture while in triage)   Anxiety:   None (UTA)   Psychosis:  Hallucinations   Duration of Psychotic symptoms: Duration of Psychotic Symptoms: Greater than six months   Trauma:  None   Obsessions:  None (UTA)   Compulsions:  None (UTA)   Inattention:  None (UTA)   Hyperactivity/Impulsivity:  None (UTA)   Oppositional/Defiant Behaviors:  None (UTA)   Emotional Irregularity:  Intense/inappropriate anger; Potentially harmful impulsivity (Client threw chair in observation room, responding to enternal stimuli.)   Other  Mood/Personality Symptoms:  No data recorded   Mental Status Exam Appearance and self-care  Stature:  Average   Weight:  Average weight   Clothing:  Disheveled   Grooming:  Neglected   Cosmetic use:  None   Posture/gait:  Slumped   Motor activity:  Agitated   Sensorium  Attention:  Distractible   Concentration:  Preoccupied   Orientation:  Person (patient able to recall age only)   Recall/memory:  -- (UTA)   Affect and Mood  Affect:  Other (Comment) (homicidal, responding to internal stimuli)   Mood:  Irritable   Relating  Eye contact:  Avoided   Facial expression:  Tense   Attitude toward examiner:  Guarded (Not responding to questions asked, rocking back and forth)   Thought and Language  Speech flow: Mute   Thought content:  Delusions   Preoccupation:  Homicidal   Hallucinations:  Auditory; Visual   Organization:  Disorganized   Company secretary of Knowledge:  -- Industrial/product designer)   Intelligence:  -- Industrial/product designer)   Abstraction:  -- (UTA)   Judgement:  Impaired   Reality Testing:  Distorted   Insight:  None/zero insight   Decision Making:  -- Industrial/product designer)   Social Functioning  Social Maturity:  Impulsive Industrial/product designer)   Social Judgement:  -- (UTA)   Stress  Stressors:  -- (UTA)  Coping Ability:  UTA  Skill Deficits:  Self-control   Supports:  Support needed     Religion: Religion/Spirituality Are You A Religious Person?:  Industrial/product designer)  Leisure/Recreation: Leisure / Recreation Do You Have Hobbies?:  (UTA)  Exercise/Diet: Exercise/Diet Do You Exercise?:  (UTA) Have You Gained or Lost A Significant Amount of Weight in the Past Six Months?:  (UTA) Do You Follow a Special Diet?:  (UTA) Do You Have Any Trouble Sleeping?:  (UTA)   CCA Employment/Education Employment/Work Situation: Employment / Work Situation Employment Situation:  Industrial/product designer) Patient's Job has Been Impacted by Current Illness:  (UTA) Has Patient ever Been in the [employer]?:  No  Education: Education Is Patient Currently Attending School?: No Did Theme park manager?: No Did You Have An Individualized Education Program (IIEP): No Did You Have Any Difficulty At School?: No Patient's Education Has Been Impacted by Current Illness: No   CCA Family/Childhood History Family and Relationship History:    Childhood History:  Childhood History By whom was/is the patient raised?:  (UTA) Did patient suffer any verbal/emotional/physical/sexual abuse as a child?:  (UTA) Did patient suffer from severe childhood neglect?:  (UTA) Has patient ever been sexually abused/assaulted/raped as an adolescent or adult?:  (UTA) Was the patient ever a victim of a crime or a disaster?:  (UTA) Witnessed domestic violence?:  (UTA) Has patient been affected by domestic violence as an adult?:  (UTA)       CCA Substance Use Alcohol/Drug Use: Alcohol / Drug Use Pain Medications: see MAR Prescriptions: see MAR Over the Counter: see MAR History of alcohol / drug use?: Yes Longest period of sobriety (when/how long): UTA Negative Consequences of Use:  (UTA) Withdrawal Symptoms:  (UTA)                         ASAM's:  Six Dimensions of Multidimensional Assessment  Dimension 1:  Acute Intoxication and/or Withdrawal Potential:      Dimension 2:  Biomedical Conditions and Complications:      Dimension 3:  Emotional, Behavioral, or Cognitive Conditions and Complications:     Dimension 4:  Readiness to Change:     Dimension 5:  Relapse, Continued use, or Continued Problem Potential:     Dimension 6:  Recovery/Living Environment:     ASAM Severity Score:    ASAM Recommended Level of Treatment:     Substance use Disorder (SUD)    Recommendations for Services/Supports/Treatments:    Disposition Recommendation per psychiatric provider: We recommend inpatient psychiatric hospitalization after medical hospitalization. Patient has been involuntarily committed on  .     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

## 2024-03-06 NOTE — Progress Notes (Signed)
   03/06/24 0742  [facility] Triage Screening (Walk-ins at [facility] only)  How Did You Hear About Us ? Legal System  What Is the Reason for Your Visit/Call Today? [name] [name] presents to [facility] escorted by [employer]. Pt appears to be paranoid throughout triage and states, I am hearing voices and she is telling me to kill myself. Pt states that objects are talking to him, such as his fridge. Pt is fidgety, cooperative and quiet throughout triage. Pt is unable to specify if he is using substances at this time. Pt does have a hx of delusional thinking and a schizophrenia diagnosis. 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 Pt denies Si, Hi.  How Long Has This Been Causing You Problems? <Week  Have You Recently Had Any Thoughts About Hurting Yourself? No  Are You Planning to Commit Suicide/Harm Yourself At This time? No  Have you Recently Had Thoughts About Hurting Someone [name]? No  Are You Planning To Harm Someone At This Time? No  Physical Abuse Denies  Verbal Abuse Denies  Sexual Abuse Denies  Exploitation of patient/patient's resources Denies  Self-Neglect Denies  Possible abuse reported to: Other (Comment)  Are you currently experiencing any auditory, visual or other hallucinations? No  Have You Used Any Alcohol or Drugs in the Past 24 Hours? No  Do you have any current medical co-morbidities that require immediate attention? No  Clinician description of patient physical appearance/behavior: quiet, fidgety, cooperative, responding to internal stimuli  What Do You Feel Would Help You the Most Today? Medication(s)  If access to [facility] was not available, would you have sought care in the Emergency Department? No  Determination of Need Urgent (48 hours)  Options For Referral Medication Management;Inpatient Hospitalization  Determination of Need filed? Yes

## 2024-03-07 ENCOUNTER — Encounter (HOSPITAL_COMMUNITY): Payer: Self-pay | Admitting: Family Medicine

## 2024-03-07 ENCOUNTER — Other Ambulatory Visit: Payer: Self-pay

## 2024-03-07 ENCOUNTER — Inpatient Hospital Stay (HOSPITAL_COMMUNITY)
Admission: AD | Admit: 2024-03-07 | Discharge: 2024-03-15 | DRG: 885 | Disposition: A | Discharge status: Home / Self Care | Payer: MEDICAID | Source: Intra-hospital | Attending: Behavioral Health | Admitting: Behavioral Health

## 2024-03-07 DIAGNOSIS — F203 Undifferentiated schizophrenia: Principal | ICD-10-CM | POA: Diagnosis present

## 2024-03-07 DIAGNOSIS — F32A Depression, unspecified: Secondary | ICD-10-CM | POA: Diagnosis present

## 2024-03-07 DIAGNOSIS — Z800 Family history of malignant neoplasm of digestive organs: Secondary | ICD-10-CM | POA: Diagnosis not present

## 2024-03-07 DIAGNOSIS — F419 Anxiety disorder, unspecified: Secondary | ICD-10-CM | POA: Diagnosis present

## 2024-03-07 DIAGNOSIS — Z560 Unemployment, unspecified: Secondary | ICD-10-CM

## 2024-03-07 DIAGNOSIS — Z5982 Transportation insecurity: Secondary | ICD-10-CM

## 2024-03-07 DIAGNOSIS — Z91148 Patient's other noncompliance with medication regimen for other reason: Secondary | ICD-10-CM

## 2024-03-07 DIAGNOSIS — F17210 Nicotine dependence, cigarettes, uncomplicated: Secondary | ICD-10-CM | POA: Diagnosis present

## 2024-03-07 DIAGNOSIS — Z79899 Other long term (current) drug therapy: Secondary | ICD-10-CM

## 2024-03-07 HISTORY — DX: Other specified abnormal findings of blood chemistry: R79.89

## 2024-03-07 LAB — POCT URINE DRUG SCREEN - MANUAL ENTRY (I-SCREEN)
POC Amphetamine UR: NOT DETECTED
POC Buprenorphine (BUP): NOT DETECTED
POC Cocaine UR: NOT DETECTED
POC Marijuana UR: POSITIVE — AB
POC Methadone UR: NOT DETECTED
POC Methamphetamine UR: NOT DETECTED
POC Morphine: NOT DETECTED
POC Oxazepam (BZO): POSITIVE — AB
POC Oxycodone UR: NOT DETECTED
POC Secobarbital (BAR): NOT DETECTED

## 2024-03-07 LAB — POTASSIUM: Potassium: 4.5 mmol/L (ref 3.5–5.1)

## 2024-03-07 MED ORDER — LORAZEPAM 2 MG/ML IJ SOLN: 2.0000 mg | Freq: Three times a day (TID) | INTRAMUSCULAR | Status: DC | PRN

## 2024-03-07 MED ORDER — HALOPERIDOL LACTATE 5 MG/ML IJ SOLN: 5.0000 mg | Freq: Three times a day (TID) | INTRAMUSCULAR | Status: DC | PRN

## 2024-03-07 MED ORDER — HALOPERIDOL LACTATE 5 MG/ML IJ SOLN: 10.0000 mg | Freq: Three times a day (TID) | INTRAMUSCULAR | Status: DC | PRN

## 2024-03-07 MED ORDER — HYDROXYZINE HCL 25 MG PO TABS
25.0000 mg | ORAL_TABLET | Freq: Three times a day (TID) | ORAL | Status: DC | PRN
  Administered 2024-03-07 – 2024-03-14 (×7): 25 mg via ORAL
  Filled 2024-03-07 (×7): qty 1

## 2024-03-07 MED ORDER — DIPHENHYDRAMINE HCL 50 MG/ML IJ SOLN: 50.0000 mg | Freq: Three times a day (TID) | INTRAMUSCULAR | Status: DC | PRN

## 2024-03-07 MED ORDER — ACETAMINOPHEN 325 MG PO TABS: 650.0000 mg | ORAL_TABLET | Freq: Four times a day (QID) | ORAL | Status: DC | PRN

## 2024-03-07 MED ORDER — MAGNESIUM HYDROXIDE 400 MG/5ML PO SUSP: 30.0000 mL | Freq: Every day | ORAL | Status: DC | PRN

## 2024-03-07 MED ORDER — ALUM & MAG HYDROXIDE-SIMETH 200-200-20 MG/5ML PO SUSP
30.0000 mL | ORAL | Status: DC | PRN
  Administered 2024-03-11: 30 mL via ORAL
  Filled 2024-03-07: qty 30

## 2024-03-07 MED ORDER — DIPHENHYDRAMINE HCL 25 MG PO CAPS: 50.0000 mg | ORAL_CAPSULE | Freq: Three times a day (TID) | ORAL | Status: DC | PRN

## 2024-03-07 MED ORDER — TRAZODONE HCL 50 MG PO TABS
50.0000 mg | ORAL_TABLET | Freq: Every evening | ORAL | Status: DC | PRN
  Administered 2024-03-07 – 2024-03-14 (×7): 50 mg via ORAL
  Filled 2024-03-07 (×8): qty 1

## 2024-03-07 MED ORDER — HALOPERIDOL 5 MG PO TABS: 5.0000 mg | ORAL_TABLET | Freq: Three times a day (TID) | ORAL | Status: DC | PRN

## 2024-03-07 MED ORDER — OLANZAPINE 5 MG PO TBDP
5.0000 mg | ORAL_TABLET | Freq: Two times a day (BID) | ORAL | Status: DC
  Administered 2024-03-07 – 2024-03-08 (×2): 5 mg via ORAL
  Filled 2024-03-07 (×2): qty 1

## 2024-03-07 NOTE — ED Notes (Signed)
 Gave patient orange juice and protein bar

## 2024-03-07 NOTE — ED Notes (Signed)
 Patient observed/assessed in bed/chair resting quietly appearing in no distress and verbalizing no complaints at this time. Will continue to monitor.

## 2024-03-07 NOTE — ED Notes (Signed)
 Patient IVC transferred to [facility] via [employer]. Pt A&Ox4, ambulatory w/ steady gait and VSS upon departure. All belongings and paperwork given to [employer].

## 2024-03-07 NOTE — ED Notes (Signed)
 I tried to get patient's blood for Potassium level but he refuses to drink and he is dehydrated, was not able to get blood sample.

## 2024-03-07 NOTE — Tx Team (Signed)
 Initial Treatment Plan 03/07/2024 6:43 PM [name] ZOX:[id number]    PATIENT STRESSORS: Financial difficulties   Loss of parent     PATIENT STRENGTHS: General fund of knowledge  Supportive family/friends    PATIENT IDENTIFIED PROBLEMS: Undifferentiated Schizophrenia                      DISCHARGE CRITERIA:  Improved stabilization in mood, thinking, and/or behavior Motivation to continue treatment in a less acute level of care Verbal commitment to aftercare and medication compliance  PRELIMINARY DISCHARGE PLAN: Outpatient therapy Return to previous living arrangement  PATIENT/FAMILY INVOLVEMENT: This treatment plan has been presented to and reviewed with the patient, [name] [name].  The patient and family have been given the opportunity to ask questions and make suggestions.  [name] Justo M Burna Atlas, RN 03/07/2024, 6:43 PM

## 2024-03-07 NOTE — Plan of Care (Signed)
   Problem: Education: Goal: Emotional status will improve Outcome: Progressing Goal: Mental status will improve Outcome: Progressing   Problem: Activity: Goal: Interest or engagement in activities will improve Outcome: Progressing Goal: Sleeping patterns will improve Outcome: Progressing

## 2024-03-07 NOTE — Discharge Instructions (Signed)
 Tranferred to [facility]

## 2024-03-07 NOTE — ED Provider Notes (Signed)
 FBC/OBS ASAP Discharge Summary  Date and Time: 03/07/2024 1:10 PM  Name: [name] [name]  MRN:  [id number]   Discharge Diagnoses:  Final diagnoses:  Undifferentiated schizophrenia [facility])    Subjective: I was hearing voices and running out of the house  Stay Summary:  XZAVIOR REINIG was admitted to [facility] Continuous Assessment Unit due to active psychosis.  Patient initially was brought in by police this Clinical research associate subsequently IVC patient as he was actively psychotic and at some point became agitated throwing a chair into a wall.  Patient only provided brief history on admission as he only stated that he was here to get help and stated that he wanted to kill someone.  Patient was started on olanzapine  5 mg twice daily he did require 1 round of agitation meds prior to being brought on the unit and patient has remained cooperative able to room with additional patients unit without any aggressive or inappropriate behaviors.  Today on reevaluation patient is able to gauge in meaningful conversation and tells this Clinical research associate that he resides with his twin brother in an apartment.  He reports that he did call the police as he had been hearing voices which were fearful to him which resulted in him running out of his apartment due to being afraid.  He reported he wanted to stop feeling that way and called the police to come and get him being take him somewhere where he could get help. Patient has been accepted to [facility] behavioral health for inpatient psychiatric treatment.  Patient has been lost to the service line since 2022 which was the last time he was seen in the emergency department due to aggressive behavior and decompensation of schizophrenia.  According to the patient he has not had any outpatient follow-up or been on any psychiatric medications since this time.  Patient is agreement with inpatient psychiatric admission however will continue to uphold IVC as patient was quite psychotic on arrival and  patient would benefit from further stabilization in a psychiatric hospital setting.  Total Time spent with patient: 45 minutes  Past Psychiatric History: Paranoid Schizophrenia and Undifferentiated Schizophrenia, prior medications include Olanzapine , Haldol , And Cogentin   Past Medical History: No reported past medical history  Family Psychiatric History: None reported  Social History:  Tobacco Cessation:  N/A, patient does not currently use tobacco products  Current Medications:  Current Facility-Administered Medications  Medication Dose Route Frequency Provider Last Rate Last Admin   acetaminophen  (TYLENOL ) tablet 650 mg  650 mg Oral Q6H PRN Buena Carmine, NP       alum & mag hydroxide-simeth (MAALOX/MYLANTA) 200-200-20 MG/5ML suspension 30 mL  30 mL Oral Q4H PRN Buena Carmine, NP       haloperidol  (HALDOL ) tablet 5 mg  5 mg Oral TID PRN Buena Carmine, NP   5 mg at 03/06/24 2138   And   diphenhydrAMINE  (BENADRYL ) capsule 50 mg  50 mg Oral TID PRN Buena Carmine, NP   50 mg at 03/06/24 2138   haloperidol  lactate (HALDOL ) injection 5 mg  5 mg Intramuscular TID PRN Buena Carmine, NP       And   diphenhydrAMINE  (BENADRYL ) injection 50 mg  50 mg Intramuscular TID PRN Buena Carmine, NP       And   LORazepam  (ATIVAN ) injection 2 mg  2 mg Intramuscular TID PRN Buena Carmine, NP       haloperidol  lactate (HALDOL ) injection 10 mg  10 mg Intramuscular  TID PRN Buena Carmine, NP   10 mg at 03/06/24 0831   And   diphenhydrAMINE  (BENADRYL ) injection 50 mg  50 mg Intramuscular TID PRN Buena Carmine, NP   50 mg at 03/06/24 0831   And   LORazepam  (ATIVAN ) injection 2 mg  2 mg Intramuscular TID PRN Buena Carmine, NP   2 mg at 03/06/24 0831   hydrOXYzine  (ATARAX ) tablet 25 mg  25 mg Oral TID PRN Buena Carmine, NP       magnesium  hydroxide (MILK OF MAGNESIA) suspension 30 mL  30 mL Oral Daily PRN Buena Carmine, NP       OLANZapine  zydis (ZYPREXA )  disintegrating tablet 5 mg  5 mg Oral BID Buena Carmine, NP   5 mg at 03/07/24 1028   potassium chloride SA (KLOR-CON M) CR tablet 40 mEq  40 mEq Oral Once Buena Carmine, NP       traZODone  (DESYREL ) tablet 50 mg  50 mg Oral QHS PRN Buena Carmine, NP       No current outpatient medications on file.    PTA Medications:  Facility Ordered Medications  Medication   acetaminophen  (TYLENOL ) tablet 650 mg   alum & mag hydroxide-simeth (MAALOX/MYLANTA) 200-200-20 MG/5ML suspension 30 mL   magnesium  hydroxide (MILK OF MAGNESIA) suspension 30 mL   haloperidol  (HALDOL ) tablet 5 mg   And   diphenhydrAMINE  (BENADRYL ) capsule 50 mg   haloperidol  lactate (HALDOL ) injection 5 mg   And   diphenhydrAMINE  (BENADRYL ) injection 50 mg   And   LORazepam  (ATIVAN ) injection 2 mg   haloperidol  lactate (HALDOL ) injection 10 mg   And   diphenhydrAMINE  (BENADRYL ) injection 50 mg   And   LORazepam  (ATIVAN ) injection 2 mg   hydrOXYzine  (ATARAX ) tablet 25 mg   traZODone  (DESYREL ) tablet 50 mg   OLANZapine  zydis (ZYPREXA ) disintegrating tablet 5 mg   potassium chloride SA (KLOR-CON M) CR tablet 40 mEq       03/20/2020    5:08 PM 10/26/2019    2:16 PM  Depression screen PHQ 2/9  Decreased Interest 1 0  Down, Depressed, Hopeless 1 0  PHQ - 2 Score 2 0  Altered sleeping 1 0  Tired, decreased energy 1 0  Change in appetite 1 0  Feeling bad or failure about yourself  1 0  Trouble concentrating 1 0  Moving slowly or fidgety/restless 0 0  Suicidal thoughts 0 0  PHQ-9 Score 7 0  Difficult doing work/chores Somewhat difficult     Flowsheet Row ED from 03/06/2024 in [facility] ED from 02/01/2023 in [facility] Department [facility] (Discharged[facility]  C-SSRS RISK CATEGORY No Risk No Risk High Risk    Musculoskeletal  Strength & Muscle Tone: within normal limits Gait &  Station: normal Patient leans: N/A  Psychiatric Specialty Exam  Presentation  General Appearance:  Appropriate for Environment  Eye Contact: Fleeting  Speech: Garbled; Pressured  Speech Volume: Increased  Handedness: Right   Mood and Affect  Mood: Dysphoric; Labile; Irritable; Angry  Affect: Blunt; Constricted; Labile; Inappropriate   Thought Process  Thought Processes: Disorganized; Irrevelant; Linear  Descriptions of Associations:Tangential  Orientation:Full (Time, Place and Person)  Thought Content:Logical  Diagnosis of Schizophrenia or Schizoaffective disorder in past: Yes  Duration of Psychotic Symptoms: Greater than six months   Hallucinations:Hallucinations: Other (comment) (Pt appears to be responding to  internal stimuli)  Ideas of Reference:Percusatory; Paranoia  Suicidal Thoughts:Suicidal Thoughts: No  Homicidal Thoughts:Homicidal Thoughts: Yes, Passive (pt refused to disclose)   Sensorium  Memory: Immediate Good  Judgment: Impaired  Insight: Lacking   Executive Functions  Concentration: Poor  Attention Span:No data recorded Recall: Poor  Fund of Knowledge: Poor  Language: Poor   Psychomotor Activity  Psychomotor Activity:No data recorded  Assets  Assets: Other (comment); Housing   Sleep  Sleep: Sleep: -- (Unable to assess due to patient is uncooperative with evaluatiojn)  Safety Checks Durations: No data on file for Sleeping  Nutritional Assessment (For OBS and FBC admissions only) Has the patient had a weight loss or gain of 10 pounds or more in the last 3 months?: No Has the patient had a decrease in food intake/or appetite?: No Does the patient have dental problems?: No Does the patient have eating habits or behaviors that may be indicators of an eating disorder including binging or inducing vomiting?: No Has the patient recently lost weight without trying?: 0 Has the patient been eating poorly because of a  decreased appetite?: 0 Malnutrition Screening Tool Score: 0    Physical Exam  Physical Exam Constitutional:      General: He is not in acute distress.    Appearance: Normal appearance. He is not ill-appearing.  HENT:     Head: Normocephalic and atraumatic.     Nose: Nose normal.   Eyes:     Extraocular Movements: Extraocular movements intact.     Conjunctiva/sclera: Conjunctivae normal.     Pupils: Pupils are equal, round, and reactive to light.    Cardiovascular:     Rate and Rhythm: Normal rate and regular rhythm.  Pulmonary:     Effort: Pulmonary effort is normal.     Breath sounds: Normal breath sounds.   Musculoskeletal:     Cervical back: Normal range of motion and neck supple.   Skin:    General: Skin is warm and dry.   Neurological:     General: No focal deficit present.     Mental Status: He is alert and oriented to person, place, and time.    Review of Systems  Psychiatric/Behavioral:  Positive for hallucinations. Negative for depression and substance abuse. The patient does not have insomnia.      Blood pressure 122/73, pulse 97, temperature 97.8 F (36.6 C), temperature source Oral, resp. rate 18, SpO2 100%. There is no height or weight on file to calculate BMI.  Demographic Factors:  NA  Loss Factors: NA  Historical Factors: Impulsivity  Risk Reduction Factors:   Living with another person, especially a relative  Continued Clinical Symptoms:  Schizophrenia:   Paranoid or undifferentiated type  Cognitive Features That Contribute To Risk:  Thought constriction (tunnel vision)    Suicide Risk:  Mild:  Suicidal ideation of limited frequency, intensity, duration, and specificity.  There are no identifiable plans, no associated intent, mild dysphoria and related symptoms, good self-control (both objective and subjective assessment), few other risk factors, and identifiable protective factors, including available and accessible social  support.  Plan Of Care/Follow-up recommendations:  Other:  Patient accepted to [facility]. IVC remains upheld.   Disposition: [facility]   Cydney Draft, NP 03/07/2024, 1:10 PM
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
## 2024-03-07 NOTE — Progress Notes (Signed)
   03/07/24 2315  Psych Admission Type (Psych Patients Only)  Admission Status Involuntary  Psychosocial Assessment  Patient Complaints Suspiciousness  Eye Contact Fair;Suspiciousness  Facial Expression Angry;Sad  Affect Appropriate to circumstance  Speech Logical/coherent  Interaction Assertive  Motor Activity Slow  Appearance/Hygiene Unremarkable  Behavior Characteristics Cooperative  Mood Preoccupied  Aggressive Behavior  Effect No apparent injury  Thought Process  Coherency WDL  Content WDL  Delusions WDL  Perception Hallucinations  Hallucination Auditory;Visual  Judgment Impaired  Confusion WDL  Danger to Self  Current suicidal ideation? Denies  Danger to Others  Danger to Others None reported or observed

## 2024-03-07 NOTE — ED Notes (Signed)
 Pt A&Ox4, calm & cooperative and in NAD at this time. Denies SI/HI/AVH. Contracts for safety. Encouragement and support given. Will continue to monitor.

## 2024-03-07 NOTE — ED Notes (Signed)
 Pt sleeping at this time. Rise and fall of chest noted. Pt in NAD at this time. Will continue to monitor.

## 2024-03-07 NOTE — Progress Notes (Signed)
 Pt has been accepted to [facility] on 03/07/2024 Bed assignment: 404-1  Pt meets inpatient criteria per: Cydney Draft NP  Attending Physician will be: Dr. Zouev   Report can be called to:  Adult unit: [phone]  Pt can arrive after discharges   Care Team Notified: [facility] Kathryn Parish RN, Cydney Draft NP, Nyle Belling RN  [place] Schalk LCSW-A   03/07/2024 11:42 AM

## 2024-03-07 NOTE — Group Note (Signed)
 Date:  03/07/2024 Time:  9:08 PM  Group Topic/Focus:  Wrap-Up Group:   The focus of this group is to help patients review their daily goal of treatment and discuss progress on daily workbooks.    Participation Level:  Active  Participation Quality:  Appropriate  Affect:  Appropriate  Cognitive:  Appropriate  Insight: Appropriate  Engagement in Group:  Engaged  Modes of Intervention:  Education and Exploration  Additional Comments:  Patient attended and participated in group tonight. He reports that his goal was to get settled in. He did.  [name] [name] 03/07/2024, 9:08 PM

## 2024-03-08 ENCOUNTER — Encounter (HOSPITAL_COMMUNITY): Payer: Self-pay

## 2024-03-08 DIAGNOSIS — F203 Undifferentiated schizophrenia: Secondary | ICD-10-CM

## 2024-03-08 MED ORDER — ARIPIPRAZOLE 10 MG PO TABS
10.0000 mg | ORAL_TABLET | Freq: Every day | ORAL | Status: DC
  Administered 2024-03-08 – 2024-03-09 (×2): 10 mg via ORAL
  Filled 2024-03-08 (×2): qty 1

## 2024-03-08 NOTE — Progress Notes (Signed)
 Received pt on the unit as transfer from [address] hall.  Pt denied SI/HI/AVH and reported he enjoyed playing in the gym when in activity group.  Pt is calm/cooperative and interacting minimally in the milieu.

## 2024-03-08 NOTE — Progress Notes (Incomplete)
 33 year old African-American male, unemployed, lives with his brother.  Background history of schizophrenia onset at the age of 14 years.  Not medicated.  Presented with florid psychosis.  Internally preoccupied and overwhelmed by auditory and visual hallucinations.  Not attending to basic needs.  Poor grooming and malodorous.  Has some insight as he wants to get better. We discussed use of aripiprazole  to target psychosis.  He consented after I reviewed the pros and cons.  We will initiate at 10 mg daily and titrate towards antipsychotic dose.  We will get collateral from his brother and evaluate him further.

## 2024-03-08 NOTE — Group Note (Signed)
 Recreation Therapy Group Note   Group Topic:Other  Group Date: 03/08/2024 Start Time: 1412 End Time: 1457 Facilitators: Mycal Conde-McCall, LRT,CTRS Location: [address] Dayroom   Activity Description/Intervention: Therapeutic Drumming. Patients with peers and staff were given the opportunity to engage in a leader facilitated [facility] Group Empowerment Drumming Circle with staff from the [employer], in partnership with [employer]. Teaching laboratory technician and trained [employer], Kathlyne Parchment leading with LRT observing and documenting intervention and pt response. This evidenced-based practice targets 7 areas of health and wellbeing in the human experience including: stress-reduction, exercise, self-expression, camaraderie/support, nurturing, spirituality, and music-making (leisure).   Goal Area(s) Addresses:  Patient will engage in pro-social way in music group.  Patient will follow directions of drum leader on the first prompt. Patient will demonstrate no behavioral issues during group.  Patient will identify if a reduction in stress level occurs as a result of participation in therapeutic drum circle.    Education: Leisure exposure, Pharmacologist, Musical expression, Discharge Planning   Affect/Mood: N/A   Participation Level: Did not attend    Clinical Observations/Individualized Feedback:     Plan: Continue to engage patient in RT group sessions 2-3x/week.   [name] [name], LRT,CTRS 03/08/2024 4:08 PM

## 2024-03-08 NOTE — Plan of Care (Signed)
  Problem: Education: Goal: Emotional status will improve Outcome: Progressing Goal: Verbalization of understanding the information provided will improve Outcome: Progressing   Problem: Activity: Goal: Sleeping patterns will improve Outcome: Progressing   Problem: Coping: Goal: Ability to demonstrate self-control will improve Outcome: Progressing

## 2024-03-08 NOTE — Group Note (Signed)
 Date:  03/08/2024 Time:  11:06 AM  Group Topic/Focus:  Goals Group:   The focus of this group is to help patients establish daily goals to achieve during treatment and discuss how the patient can incorporate goal setting into their daily lives to aide in recovery.    Participation Level:  Active  Participation Quality:  Appropriate and Attentive  Affect:  Appropriate  Cognitive:  Alert and Appropriate  Insight: Appropriate and Good  Engagement in Group:  Engaged  Modes of Intervention:  Discussion  Additional Comments:  Pt state his goal were to make plans for discharge.  Tita Form 03/08/2024, 11:06 AM
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
## 2024-03-08 NOTE — BH IP Treatment Plan (Signed)
 Interdisciplinary Treatment and Diagnostic Plan Update  03/08/2024 Time of Session: [address] [name] [name] MRN: [id number]  Principal Diagnosis: Undifferentiated schizophrenia [facility])  Secondary Diagnoses: Principal Problem:   Undifferentiated schizophrenia (HCC)   Current Medications:  Current Facility-Administered Medications  Medication Dose Route Frequency Provider Last Rate Last Admin   acetaminophen  (TYLENOL ) tablet 650 mg  650 mg Oral Q6H PRN Buena Carmine, NP       alum & mag hydroxide-simeth (MAALOX/MYLANTA) 200-200-20 MG/5ML suspension 30 mL  30 mL Oral Q4H PRN Buena Carmine, NP       ARIPiprazole  (ABILIFY ) tablet 10 mg  10 mg Oral Daily Izediuno, Vincent A, MD   10 mg at 03/08/24 1148   haloperidol  (HALDOL ) tablet 5 mg  5 mg Oral TID PRN Buena Carmine, NP       And   diphenhydrAMINE  (BENADRYL ) capsule 50 mg  50 mg Oral TID PRN Buena Carmine, NP       haloperidol  lactate (HALDOL ) injection 5 mg  5 mg Intramuscular TID PRN Buena Carmine, NP       And   diphenhydrAMINE  (BENADRYL ) injection 50 mg  50 mg Intramuscular TID PRN Buena Carmine, NP       And   LORazepam  (ATIVAN ) injection 2 mg  2 mg Intramuscular TID PRN Buena Carmine, NP       haloperidol  lactate (HALDOL ) injection 10 mg  10 mg Intramuscular TID PRN Buena Carmine, NP       And   diphenhydrAMINE  (BENADRYL ) injection 50 mg  50 mg Intramuscular TID PRN Buena Carmine, NP       And   LORazepam  (ATIVAN ) injection 2 mg  2 mg Intramuscular TID PRN Buena Carmine, NP       hydrOXYzine  (ATARAX ) tablet 25 mg  25 mg Oral TID PRN Buena Carmine, NP   25 mg at 03/07/24 2121   magnesium  hydroxide (MILK OF MAGNESIA) suspension 30 mL  30 mL Oral Daily PRN Buena Carmine, NP       traZODone  (DESYREL ) tablet 50 mg  50 mg Oral QHS PRN Buena Carmine, NP   50 mg at 03/07/24 2121   PTA Medications: No medications prior to admission.    Patient Stressors: Financial  difficulties   Loss of parent    Patient Strengths: General fund of knowledge  Supportive family/friends   Treatment Modalities: Medication Management, Group therapy, Case management,  1 to 1 session with clinician, Psychoeducation, Recreational therapy.   Physician Treatment Plan for Primary Diagnosis: Undifferentiated schizophrenia (HCC) Long Term Goal(s): Improvement in symptoms so as ready for discharge   Short Term Goals: Ability to identify and develop effective coping behaviors will improve Compliance with prescribed medications will improve Ability to identify triggers associated with substance abuse/mental health issues will improve Ability to identify changes in lifestyle to reduce recurrence of condition will improve Ability to verbalize feelings will improve Ability to disclose and discuss suicidal ideas  Medication Management: Evaluate patient's response, side effects, and tolerance of medication regimen.  Therapeutic Interventions: 1 to 1 sessions, Unit Group sessions and Medication administration.  Evaluation of Outcomes: Not Progressing  Physician Treatment Plan for Secondary Diagnosis: Principal Problem:   Undifferentiated schizophrenia (HCC)  Long Term Goal(s): Improvement in symptoms so as ready for discharge   Short Term Goals: Ability to identify and develop effective coping behaviors will improve Compliance with prescribed medications will improve Ability to identify triggers associated with substance abuse/mental  health issues will improve Ability to identify changes in lifestyle to reduce recurrence of condition will improve Ability to verbalize feelings will improve Ability to disclose and discuss suicidal ideas     Medication Management: Evaluate patient's response, side effects, and tolerance of medication regimen.  Therapeutic Interventions: 1 to 1 sessions, Unit Group sessions and Medication administration.  Evaluation of Outcomes: Not  Progressing   RN Treatment Plan for Primary Diagnosis: Undifferentiated schizophrenia (HCC) Long Term Goal(s): Knowledge of disease and therapeutic regimen to maintain health will improve  Short Term Goals: Ability to remain free from injury will improve, Ability to verbalize frustration and anger appropriately will improve, Ability to demonstrate self-control, Ability to participate in decision making will improve, Ability to verbalize feelings will improve, Ability to disclose and discuss suicidal ideas, Ability to identify and develop effective coping behaviors will improve, and Compliance with prescribed medications will improve  Medication Management: RN will administer medications as ordered by provider, will assess and evaluate patient's response and provide education to patient for prescribed medication. RN will report any adverse and/or side effects to prescribing provider.  Therapeutic Interventions: 1 on 1 counseling sessions, Psychoeducation, Medication administration, Evaluate responses to treatment, Monitor vital signs and CBGs as ordered, Perform/monitor CIWA, COWS, AIMS and Fall Risk screenings as ordered, Perform wound care treatments as ordered.  Evaluation of Outcomes: Not Progressing   LCSW Treatment Plan for Primary Diagnosis: Undifferentiated schizophrenia (HCC) Long Term Goal(s): Safe transition to appropriate next level of care at discharge, Engage patient in therapeutic group addressing interpersonal concerns.  Short Term Goals: Engage patient in aftercare planning with referrals and resources, Increase social support, Increase ability to appropriately verbalize feelings, Increase emotional regulation, Facilitate acceptance of mental health diagnosis and concerns, Facilitate patient progression through stages of change regarding substance use diagnoses and concerns, Identify triggers associated with mental health/substance abuse issues, and Increase skills for wellness and  recovery  Therapeutic Interventions: Assess for all discharge needs, 1 to 1 time with Social worker, Explore available resources and support systems, Assess for adequacy in community support network, Educate family and significant other(s) on suicide prevention, Complete Psychosocial Assessment, Interpersonal group therapy.  Evaluation of Outcomes: Not Progressing   Progress in Treatment: Attending groups: Yes. Participating in groups: Yes. Taking medication as prescribed: Yes. Toleration medication: Yes. Family/Significant other contact made: No, will contact:  consents pending Patient understands diagnosis: Yes. Discussing patient identified problems/goals with staff: Yes. Medical problems stabilized or resolved: Yes. Denies suicidal/homicidal ideation: Yes. Issues/concerns per patient self-inventory: No.   New problem(s) identified: No, Describe:  none  New Short Term/Long Term Goal(s): medication stabilization, elimination of SI thoughts, development of comprehensive mental wellness plan.    Patient Goals:  I want to stop hearing voices and make sure I am on my medication  Discharge Plan or Barriers: Patient recently admitted. CSW will continue to follow and assess for appropriate referrals and possible discharge planning.    Reason for Continuation of Hospitalization: Hallucinations Medication stabilization  Estimated Length of Stay: 5-7 days  Last 3 [place] Suicide Severity Risk Score: Flowsheet Row [facility] (Current[facility]2024 in [facility] ED from 02/01/2023 in [facility]  C-SSRS RISK CATEGORY No Risk No Risk No Risk    Last [facility] 2/9 Scores:    03/20/2020    5:08 PM 10/26/2019    2:16 PM  Depression screen PHQ 2/9  Decreased  Interest 1 0  Down, Depressed, Hopeless 1 0  PHQ - 2 Score 2 0  Altered sleeping 1 0   Tired, decreased energy 1 0  Change in appetite 1 0  Feeling bad or failure about yourself  1 0  Trouble concentrating 1 0  Moving slowly or fidgety/restless 0 0  Suicidal thoughts 0 0  PHQ-9 Score 7 0  Difficult doing work/chores Somewhat difficult     Scribe for Treatment Team: Vonzell Guerin, Milinda Allen 03/08/2024 1:34 PM

## 2024-03-08 NOTE — Group Note (Signed)
 Date:  03/08/2024 Time:  6:23 PM  Group Topic/Focus:  Goals Group:   The focus of this group is to help patients establish daily goals to achieve during treatment and discuss how the patient can incorporate goal setting into their daily lives to aide in recovery. Orientation:   The focus of this group is to educate the patient on the purpose and policies of crisis stabilization and provide a format to answer questions about their admission.  The group details unit policies and expectations of patients while admitted.    Participation Level:  Did Not Attend   Kit Peri 03/08/2024, 6:23 PM
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
## 2024-03-08 NOTE — BHH Suicide Risk Assessment (Signed)
 Suicide Risk Assessment  Admission Assessment    [facility] Admission Suicide Risk Assessment   Nursing information obtained from:  Patient  Demographic factors:  Male, Unemployed  Current Mental Status:  NA  Loss Factors:  NA  Historical Factors:  NA  Risk Reduction Factors:  Positive social support, Positive therapeutic relationship  Total Time spent with patient: 1.5 hours  Principal Problem: Undifferentiated schizophrenia (HCC)  Diagnosis:  Principal Problem:   Undifferentiated schizophrenia (HCC)  Subjective Data: See H&P.  Continued Clinical Symptoms:  Alcohol Use Disorder Identification Test Final Score (AUDIT): 0 The Alcohol Use Disorders Identification Test, Guidelines for Use in Primary Care, Second Edition.  World Science writer [facility]). Score between 0-7:  no or low risk or alcohol related problems. Score between 8-15:  moderate risk of alcohol related problems. Score between 16-19:  high risk of alcohol related problems. Score 20 or above:  warrants further diagnostic evaluation for alcohol dependence and treatment.  CLINICAL FACTORS:   Alcohol/Substance Abuse/Dependencies Schizophrenia:   Paranoid or undifferentiated type More than one psychiatric diagnosis Currently Psychotic Unstable or Poor Therapeutic Relationship Previous Psychiatric Diagnoses and Treatments  Musculoskeletal: Strength & Muscle Tone: within normal limits Gait & Station: normal Patient leans: N/A  Psychiatric Specialty Exam:  Presentation  General Appearance:  Disheveled  Eye Contact: Fleeting  Speech: Slow; Garbled  Speech Volume: Decreased  Handedness: Right  Mood and Affect  Mood: Dysphoric  Affect: Restricted; Flat  Thought Process  Thought Processes: Disorganized  Descriptions of Associations:Loose  Orientation:Partial  Thought Content:Scattered  History of Schizophrenia/Schizoaffective disorder:Yes  Duration of Psychotic Symptoms:Greater than  six months  Hallucinations:Hallucinations: -- (Patient appears to be responding to some internal stimuli.)  Ideas of Reference:None  Suicidal Thoughts:Suicidal Thoughts: No  Homicidal Thoughts:Homicidal Thoughts: No  Sensorium  Memory: Immediate Fair; Recent Fair; Remote Poor  Judgment: Impaired  Insight: Shallow  Executive Functions  Concentration: Fair  Attention Span:Fair  Recall: [employer] of Knowledge: Fair  Language: Fair  Psychomotor Activity  Psychomotor Activity:Psychomotor Activity: Normal  Assets  Assets: Desire for Improvement; Housing; Resilience; Social Support  Sleep  Sleep:Sleep: Fair Number of Hours of Sleep: 6  Physical/Ros Exam: See H&P. Blood pressure 109/88, pulse (!) 104, temperature 97.9 F (36.6 C), temperature source Oral, resp. rate 20, height 5' 4 (1.626 m), weight 99.1 kg, SpO2 100%. Body mass index is 37.49 kg/m.  COGNITIVE FEATURES THAT CONTRIBUTE TO RISK:  Loss of executive function    SUICIDE RISK:   Moderate:  Frequent suicidal ideation with limited intensity, and duration, some specificity in terms of plans, no associated intent, good self-control, limited dysphoria/symptomatology, some risk factors present, and identifiable protective factors, including available and accessible social support.  PLAN OF CARE: See H&P.  I certify that inpatient services furnished can reasonably be expected to improve the patient's condition.   Asuncion Layer, NP, pmhnp, fnp-bc. 03/08/2024, 3:36 PM

## 2024-03-08 NOTE — Group Note (Unsigned)
 Date:  03/08/2024 Time:  9:07 AM  Group Topic/Focus:  Goals Group:   The focus of this group is to help patients establish daily goals to achieve during treatment and discuss how the patient can incorporate goal setting into their daily lives to aide in recovery.     Participation Level:  {BHH PARTICIPATION ZOXWR:60454}  Participation Quality:  {BHH PARTICIPATION QUALITY:22265}  Affect:  {BHH AFFECT:22266}  Cognitive:  {BHH COGNITIVE:22267}  Insight: {BHH Insight2:20797}  Engagement in Group:  {BHH ENGAGEMENT IN UJWJX:91478}  Modes of Intervention:  {BHH MODES OF INTERVENTION:22269}  Additional Comments:  ***  Tita Form 03/08/2024, 9:07 AM

## 2024-03-08 NOTE — Progress Notes (Signed)
   03/08/24 1000  Psych Admission Type (Psych Patients Only)  Admission Status Involuntary  Psychosocial Assessment  Patient Complaints Suspiciousness  Eye Contact Fair  Facial Expression Flat  Affect Anxious  Speech Logical/coherent  Interaction Minimal  Motor Activity Slow  Appearance/Hygiene Unremarkable  Behavior Characteristics Cooperative  Mood Preoccupied  Thought Process  Coherency Blocking  Content WDL  Delusions None reported or observed  Perception Hallucinations  Hallucination Auditory  Judgment Impaired  Confusion None  Danger to Self  Current suicidal ideation? Denies  Danger to Others  Danger to Others None reported or observed

## 2024-03-08 NOTE — Group Note (Signed)
 Date:  03/08/2024 Time:  9:11 PM  Group Topic/Focus:  Wrap-Up Group:   The focus of this group is to help patients review their daily goal of treatment and discuss progress on daily workbooks.    Participation Level:  Active  Participation Quality:  Appropriate  Affect:  Appropriate  Cognitive:  Appropriate  Insight: Appropriate  Engagement in Group:  Engaged  Modes of Intervention:  Education and Exploration  Additional Comments:  Patient attended and participated in group tonight. He report that his goal was to get settled in. He attended the groups, went to the gym and meals.  Eugena Herter Dacosta 03/08/2024, 9:11 PM

## 2024-03-09 ENCOUNTER — Other Ambulatory Visit (HOSPITAL_COMMUNITY): Payer: Self-pay

## 2024-03-09 ENCOUNTER — Telehealth (HOSPITAL_COMMUNITY): Payer: Self-pay | Admitting: Pharmacy Technician

## 2024-03-09 DIAGNOSIS — F203 Undifferentiated schizophrenia: Secondary | ICD-10-CM | POA: Diagnosis not present

## 2024-03-09 MED ORDER — ARIPIPRAZOLE 10 MG PO TABS
20.0000 mg | ORAL_TABLET | Freq: Every day | ORAL | Status: DC
  Administered 2024-03-10 – 2024-03-15 (×6): 20 mg via ORAL
  Filled 2024-03-09 (×6): qty 2

## 2024-03-09 NOTE — Progress Notes (Signed)
 Recreation Therapy Notes  INPATIENT RECREATION THERAPY ASSESSMENT  Patient Details Name: ROALD LUKACS MRN: [id number] DOB: [birth year] Today's Date: 03/09/2024       Information Obtained From: Patient  Able to Participate in Assessment/Interview: Yes (Patient was standing up, rocking back and forth on his feet.)  Patient Presentation: Alert  Reason for Admission (Per Patient): Other (Comments) (mental health issues)  Patient Stressors: Other (Comment) (Pt stated he was having issues at home, not getting a long with his house mates.)  Coping Skills:   Journal, TV, Sports, Aggression, Music, Exercise, Deep Breathing, Meditate, Impulsivity, Art, Prayer, Avoidance, Read, Dance, Hot Bath/Shower  Leisure Interests (2+):  Individual - Other (Comment), Games - Video games (Work)  Frequency of Recreation/Participation: Other (Comment) (Daily)  Awareness of Community Resources:  Yes  Community Resources:  Restaurants, [employer], Engineering geologist  Current Use: Yes  If no, Barriers?:    Expressed Interest in [employer] Information: No  [employer] of Residence:  [place]  Patient Main Form of Transportation: Walk  Patient Strengths:  Ability to clean up after himself; Stay productive; Be positive  Patient Identified Areas of Improvement:  Some where to stay; Getting a new job  Patient Goal for Hospitalization:  get checked out and looked at  Current SI (including self-harm):  No  Current HI:  No  Current AVH: No  Staff Intervention Plan: Group Attendance, Collaborate with Interdisciplinary Treatment Team  Consent to Intern Participation: N/A   Yittel Emrich-McCall, LRT,CTRS Hatim Homann A Marice Guidone-McCall 03/09/2024, 12:38 PM

## 2024-03-09 NOTE — Progress Notes (Signed)
   03/09/24 1100  Psych Admission Type (Psych Patients Only)  Admission Status Involuntary  Psychosocial Assessment  Patient Complaints Anxiety;Suspiciousness  Eye Contact Fair  Facial Expression Flat  Affect Anxious  Speech Logical/coherent  Interaction Minimal  Motor Activity Slow  Appearance/Hygiene Unremarkable  Behavior Characteristics Cooperative  Mood Preoccupied  Thought Process  Coherency WDL  Content WDL  Delusions None reported or observed  Perception Hallucinations  Hallucination Auditory  Judgment Impaired  Confusion None  Danger to Self  Current suicidal ideation? Denies  Danger to Others  Danger to Others None reported or observed

## 2024-03-09 NOTE — Group Note (Signed)
 Date:  03/09/2024 Time:  1:46 PM  Group Topic/Focus:  Goals Group:   The focus of this group is to help patients establish daily goals to achieve during treatment and discuss how the patient can incorporate goal setting into their daily lives to aide in recovery. Orientation:   The focus of this group is to educate the patient on the purpose and policies of crisis stabilization and provide a format to answer questions about their admission.  The group details unit policies and expectations of patients while admitted.    Participation Level:  Did Not Attend   Almarie Arias 03/09/2024, 1:46 PM

## 2024-03-09 NOTE — Group Note (Signed)
 Date:  03/09/2024 Time:  9:15 PM  Group Topic/Focus:  Wrap-Up Group:   The focus of this group is to help patients review their daily goal of treatment and discuss progress on daily workbooks.    Participation Level:  Active  Participation Quality:  Appropriate  Affect:  Appropriate  Cognitive:  Appropriate  Insight: Appropriate  Engagement in Group:  Engaged  Modes of Intervention:  Education and Exploration  Additional Comments:  Patient attended and participated in group tonight.He reports that the best thing that happened for him today was being able to speak with the Doctor about his discharge plans.  Eugena Herter Dacosta 03/09/2024, 9:15 PM
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
## 2024-03-09 NOTE — Progress Notes (Signed)
 [facility] MD Progress Note  03/09/2024 9:41 AM [name] [name]  MRN:  [id number]  Reason for admission: 33 year old AA male with previous hx of mental illness & inpatient psychiatric admission. Javarian was a patient in this [facility] in April of 2022. At the time, he was having suicidal thoughts. After stabilization on that admission, he was discharged on medications with referral & appointment for outpatient psychiatric services. Patient is being re-admitted to the [facility] this time from the [facility] with complaint of complaint of worsening psychosis. He was apparently brought to the [facility] by the [employer]. After evaluation at [facility], patient was recommended & transferred to the [facility] for further psychiatric evaluation/treatments. A review of his current Gerre Kraft reports has shown his UDS was positive for THC.   Daily notes: Robbi is seen in his room, chart reviewed. The chart findings discussed with the treatment team during treatment team meeting this afternoon. He was sitting on the side of his bed. He presents disheveled & malodorous. He is making a good eye contact & verbally responsive. He reports, I'm doing well. I'm trying to see if I can go home today. I'm still calm. I have no anxiety symptoms. I have no depression symptoms. My mood is good. I slept well last night. I just want to be outside. I don't like being in here. Also patient is making a concrete request & presents calm, patient has not been keeping up with his personal hygiene. He isolates in his room & does not attend or participate in the group sessions. We have increased his Abilify  from 10 mg to 20 mg daily for mood control. We are planning to call patient's brother for a collateral information to see how patient has been doing at home prior to this admission & what led to this present admission. Patient currently is living with his brother. The plan of care for this patient is, if this po Abilify  is effective in controlling his symptoms, patient may transitioned to  the monthly injectable form by discharge. Reviewed vital signs, stable. There no new lab results. Patient is instructed & encouraged to come out of his room & participate in the group sessions.  Principal Problem: Undifferentiated schizophrenia (HCC)  Diagnosis: Principal Problem:   Undifferentiated schizophrenia (HCC)  Total Time spent with patient: 45 minutes  Past Psychiatric History: See H&P.  Past Medical History:  Past Medical History:  Diagnosis Date   Low testosterone     Schizophrenia (HCC)    Tibial plateau fracture, left     Past Surgical History:  Procedure Laterality Date   APPENDECTOMY     LAPAROSCOPIC APPENDECTOMY N/A 07/16/2017   Procedure: APPENDECTOMY LAPAROSCOPIC;  Surgeon: Shela Derby, MD;  Location: [facility] OR;  Service: General;  Laterality: N/A;   LAPAROTOMY N/A 07/16/2017   Procedure: POSSIBLE OPEN  LAPAROTOMY;  Surgeon: Shela Derby, MD;  Location: [facility] OR;  Service: General;  Laterality: N/A;   NO PAST SURGERIES     ORIF TIBIA PLATEAU Left 04/15/2018   Procedure: OPEN REDUCTION INTERNAL FIXATION (ORIF) LEFT BICONDYLAR TIBIAL PLATEAU;  Surgeon: Wes Hamman, MD;  Location: [facility] OR;  Service: Orthopedics;  Laterality: Left;   Family History:  Family History  Problem Relation Age of Onset   Other Mother    Throat cancer Mother    Other Brother    Family Psychiatric  History: See H&P.  Social History:  Social History   Substance and Sexual Activity  Alcohol Use Not Currently     Social History  Substance and Sexual Activity  Drug Use Not Currently   Types: Marijuana   Comment: denies use of marijuana     Social History   Socioeconomic History   Marital status: Single    Spouse name: Not on file   Number of children: Not on file   Years of education: Not on file   Highest education level: Not on file  Occupational History   Not on file  Tobacco Use   Smoking status: Some Days    Current packs/day: 0.25    Average packs/day: 0.3  packs/day for 12.0 years (3.0 ttl pk-yrs)    Types: Cigarettes   Smokeless tobacco: Never  Vaping Use   Vaping status: Never Used  Substance and Sexual Activity   Alcohol use: Not Currently   Drug use: Not Currently    Types: Marijuana    Comment: denies use of marijuana    Sexual activity: Not Currently  Other Topics Concern   Not on file  Social History Narrative   Not on file   Social Drivers of Health   Financial Resource Strain: Not on file  Food Insecurity: No Food Insecurity (03/07/2024)   Hunger Vital Sign    Worried About Running Out of Food in the Last Year: Never true    Ran Out of Food in the Last Year: Never true  Transportation Needs: Unmet Transportation Needs (03/07/2024)   PRAPARE - Administrator, Civil Service (Medical): Yes    Lack of Transportation (Non-Medical): Yes  Physical Activity: Not on file  Stress: Not on file  Social Connections: Not on file   Additional Social History:    Sleep: Good Estimated Sleeping Duration (Last 24 Hours): 5.50-7.50 hours  Appetite:  Good  Current Medications: Current Facility-Administered Medications  Medication Dose Route Frequency Provider Last Rate Last Admin   acetaminophen  (TYLENOL ) tablet 650 mg  650 mg Oral Q6H PRN Buena Carmine, NP       alum & mag hydroxide-simeth (MAALOX/MYLANTA) 200-200-20 MG/5ML suspension 30 mL  30 mL Oral Q4H PRN Buena Carmine, NP       ARIPiprazole  (ABILIFY ) tablet 10 mg  10 mg Oral Daily Izediuno, Vincent A, MD   10 mg at 03/09/24 3086   haloperidol  (HALDOL ) tablet 5 mg  5 mg Oral TID PRN Buena Carmine, NP       And   diphenhydrAMINE  (BENADRYL ) capsule 50 mg  50 mg Oral TID PRN Buena Carmine, NP       haloperidol  lactate (HALDOL ) injection 5 mg  5 mg Intramuscular TID PRN Buena Carmine, NP       And   diphenhydrAMINE  (BENADRYL ) injection 50 mg  50 mg Intramuscular TID PRN Buena Carmine, NP       And   LORazepam  (ATIVAN ) injection 2 mg  2  mg Intramuscular TID PRN Buena Carmine, NP       haloperidol  lactate (HALDOL ) injection 10 mg  10 mg Intramuscular TID PRN Buena Carmine, NP       And   diphenhydrAMINE  (BENADRYL ) injection 50 mg  50 mg Intramuscular TID PRN Buena Carmine, NP       And   LORazepam  (ATIVAN ) injection 2 mg  2 mg Intramuscular TID PRN Buena Carmine, NP       hydrOXYzine  (ATARAX ) tablet 25 mg  25 mg Oral TID PRN Buena Carmine, NP   25 mg at 03/08/24 2028   magnesium  hydroxide (MILK  OF MAGNESIA) suspension 30 mL  30 mL Oral Daily PRN Buena Carmine, NP       traZODone  (DESYREL ) tablet 50 mg  50 mg Oral QHS PRN Buena Carmine, NP   50 mg at 03/08/24 2028   Lab Results:  Results for orders placed or performed during the hospital encounter of 03/07/24 (from the past 48 hours)  Potassium     Status: None   Collection Time: 03/07/24  6:46 PM  Result Value Ref Range   Potassium 4.5 3.5 - 5.1 mmol/L    Comment: Performed at [facility], [address] [place], [place] [address]   Blood Alcohol level:  Lab Results  Component Value Date   [facility] <15 03/06/2024   ETH <10 02/15/2021   Metabolic Disorder Labs: Lab Results  Component Value Date   HGBA1C 5.3 03/06/2024   MPG 105.41 03/06/2024   MPG 114.02 01/08/2021   Lab Results  Component Value Date   PROLACTIN 5.6 01/14/2015   Lab Results  Component Value Date   CHOL 182 03/06/2024   TRIG 67 03/06/2024   HDL 37 (L) 03/06/2024   CHOLHDL 4.9 03/06/2024   VLDL 13 03/06/2024   LDLCALC 132 (H) 03/06/2024   LDLCALC 109 (H) 01/08/2021   Physical Findings: AIMS:  ,  ,  ,  ,  ,  ,   CIWA:    COWS:     Musculoskeletal: Strength & Muscle Tone: within normal limits Gait & Station: normal Patient leans: N/A  Psychiatric Specialty Exam:  Presentation  General Appearance:  Disheveled  Eye Contact: Fleeting  Speech: Slow; Garbled  Speech Volume: Decreased  Handedness: Right   Mood and Affect   Mood: Dysphoric  Affect: Restricted; Flat  Thought Process  Thought Processes: Disorganized  Descriptions of Associations:Loose  Orientation:Partial  Thought Content:Scattered  History of Schizophrenia/Schizoaffective disorder:Yes  Duration of Psychotic Symptoms:Greater than six months  Hallucinations:Hallucinations: -- (Patient appears to be responding to some internal stimuli.)  Ideas of Reference:None  Suicidal Thoughts:Suicidal Thoughts: No  Homicidal Thoughts:Homicidal Thoughts: No   Sensorium  Memory: Immediate Fair; Recent Fair; Remote Poor  Judgment: Impaired  Insight: Shallow   Executive Functions  Concentration: Fair  Attention Span: Fair  Recall: [employer] of Knowledge: Fair  Language: Fair  Psychomotor Activity  Psychomotor Activity: Psychomotor Activity: Normal  Assets  Assets: Desire for Improvement; Housing; Resilience; Social Support  Sleep  Sleep: Sleep: Fair Number of Hours of Sleep: 6  Physical Exam: Physical Exam Vitals and nursing note reviewed.  HENT:     Head: Normocephalic.     Nose: Nose normal.   Cardiovascular:     Rate and Rhythm: Normal rate.     Pulses: Normal pulses.  Pulmonary:     Effort: Pulmonary effort is normal.  Genitourinary:    Comments: Deferred  Musculoskeletal:        General: Normal range of motion.     Cervical back: Normal range of motion.   Neurological:     General: No focal deficit present.     Mental Status: He is oriented to person, place, and time.    Review of Systems  Constitutional:  Negative for chills, diaphoresis and fever.  HENT:  Negative for congestion and sore throat.   Respiratory:  Negative for cough, shortness of breath and wheezing.   Cardiovascular:  Negative for chest pain and palpitations.  Gastrointestinal:  Negative for abdominal pain, constipation, diarrhea, heartburn, nausea and vomiting.  Genitourinary:  Negative for dysuria.   Musculoskeletal:  Negative for joint pain and myalgias.  Skin:  Negative for itching and rash.  Neurological:  Negative for dizziness, tingling, tremors, sensory change, speech change, focal weakness, seizures, loss of consciousness, weakness and headaches.  Endo/Heme/Allergies:        NKDA  Psychiatric/Behavioral:  Positive for substance abuse.    Blood pressure 136/84, pulse 98, temperature 98.2 F (36.8 C), temperature source Oral, resp. rate 20, height 5' 4 (1.626 m), weight 99.1 kg, SpO2 100%. Body mass index is 37.49 kg/m.  Treatment Plan Summary: Daily contact with patient to assess and evaluate symptoms and progress in treatment and Medication management.   Principal/active diagnoses.  Undifferentiated schizophrenia (HCC).  Cannabis use disorder.  Plan: The risks/benefits/side-effects/alternatives to the medications in use were discussed in detail with the patient and time was given for patient's questions. The patient consents to medication trial.    -Increased Abilify  from 10 mg to 20 mg po daily for mood control.  -Continue hydroxyzine  25 mg po tid prn for anxiety.  -Continue Trazodone  50 mg po Q bedtime prn for insomnia.    Agitation protocols.  -Continue as recommended (See MAR).   Other PRNS -Continue Tylenol  650 mg every 6 hours PRN for mild pain -Continue Maalox 30 ml Q 4 hrs PRN for indigestion -Continue MOM 30 ml po Q 6 hrs for constipation   Safety and Monitoring: Voluntary admission to inpatient psychiatric unit for safety, stabilization and treatment Daily contact with patient to assess and evaluate symptoms and progress in treatment Patient's case to be discussed in multi-disciplinary team meeting Observation Level : q15 minute checks Vital signs: q12 hours Precautions: Safety   Discharge Planning: Social work and case management to assist with discharge planning and identification of hospital follow-up needs prior to discharge Estimated LOS: 5-7  days Discharge Concerns: Need to establish a safety plan; Medication compliance and effectiveness Discharge Goals: Return home with outpatient referrals for mental health follow-up including medication management/psychotherapy  Asuncion Layer, NP, pmhnp, fnp-bc. 03/09/2024, 9:41 AM

## 2024-03-09 NOTE — Telephone Encounter (Signed)
 Pharmacy Patient Advocate Encounter  Insurance verification completed.    The patient is insured through [employer] [place].     Ran test claim for Abilify  400mg /ml syringe and the current 28 day co-pay is $4.00.   This test claim was processed through [facility]- copay amounts may vary at other pharmacies due to pharmacy/plan contracts, or as the patient moves through the different stages of their insurance plan.

## 2024-03-09 NOTE — Plan of Care (Signed)
   Problem: Education: Goal: Emotional status will improve Outcome: Progressing   Problem: Activity: Goal: Interest or engagement in activities will improve Outcome: Progressing
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
## 2024-03-10 MED ORDER — ARIPIPRAZOLE ER 400 MG IM SRER
400.0000 mg | INTRAMUSCULAR | Status: DC
  Administered 2024-03-11: 400 mg via INTRAMUSCULAR
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
## 2024-03-10 NOTE — Progress Notes (Signed)
 Patient is responding well to treatment.  He is tolerating aripiprazole  well.  Psychosis is gradually resolving.  He is interacting more with the milieu.  We will explore long-acting injectable with him.

## 2024-03-10 NOTE — Plan of Care (Signed)
   Problem: Education: Goal: Emotional status will improve Outcome: Progressing   Problem: Activity: Goal: Interest or engagement in activities will improve Outcome: Progressing

## 2024-03-10 NOTE — [facility] Group Notes (Signed)
 [facility] Group Notes:  (Nursing/MHT/Case Management/Adjunct)  Date:  03/10/2024  Time:  2000  Type of Therapy:  Wrap up group  Participation Level:  Active  Participation Quality:  Appropriate, Attentive, Sharing, and Supportive  Affect:  Flat  Cognitive:  Alert  Insight:  Improving  Engagement in Group:  Engaged  Modes of Intervention:  Clarification, Education, and Socialization  Summary of Progress/Problems: Positive thinking and self-care were discussed.   [name] [name] 03/10/2024, 9:13 PM

## 2024-03-10 NOTE — Progress Notes (Signed)
 Conversation with patient:  CSW asked patient is he is receives [employer] for [employer].  He said he was in the past but is no longer receiving them, however his brother is currently receiving services.   Alis Sawchuk, LCSWA 03/10/2024

## 2024-03-10 NOTE — Progress Notes (Signed)
   03/10/24 0800  Psych Admission Type (Psych Patients Only)  Admission Status Involuntary  Psychosocial Assessment  Patient Complaints Suspiciousness  Eye Contact Fair  Facial Expression Flat  Affect Flat  Speech Logical/coherent  Interaction Minimal  Motor Activity Slow  Appearance/Hygiene Unremarkable  Behavior Characteristics Cooperative  Mood Preoccupied  Thought Process  Coherency WDL  Content WDL  Delusions None reported or observed  Perception Hallucinations  Hallucination Auditory  Judgment Impaired  Confusion None  Danger to Self  Current suicidal ideation? Denies  Danger to Others  Danger to Others None reported or observed

## 2024-03-10 NOTE — Group Note (Signed)
 Recreation Therapy Group Note   Group Topic:Team Building  Group Date: 03/10/2024 Start Time: 1015 End Time: 1045 Facilitators: Jahna Liebert-McCall, LRT,CTRS Location: 500 Hall Dayroom   Group Topic: Communication, Team Building, Problem Solving  Goal Area(s) Addresses:  Patient will effectively work with peer towards shared goal.  Patient will identify skills used to make activity successful.  Patient will identify how skills used during activity can be applied to reach post d/c goals.   Behavioral Response:   Intervention: STEM Activity- Glass blower/designer  Activity: Tallest [employer]. In teams of 5-6, patients were given 11 craft pipe cleaners. Using the materials provided, patients were instructed to compete again the opposing team(s) to build the tallest free-standing structure from floor level. The activity was timed; difficulty increased by Clinical research associate as Production designer, theatre/television/film continued.  Systematically resources were removed with additional directions for example, placing one arm behind their back, working in silence, and shape stipulations. LRT facilitated post-activity discussion reviewing team processes and necessary communication skills involved in completion. Patients were encouraged to reflect how the skills utilized, or not utilized, in this activity can be incorporated to positively impact support systems post discharge.  Education: Pharmacist, community, Scientist, physiological, Discharge Planning   Education Outcome: Acknowledges education/In group clarification offered/Needs additional education.    Affect/Mood: N/A   Participation Level: Did not attend    Clinical Observations/Individualized Feedback:     Plan: Continue to engage patient in RT group sessions 2-3x/week.   Lester Platas-McCall, LRT,CTRS  03/10/2024 1:25 PM

## 2024-03-10 NOTE — [facility] Group Notes (Signed)
 Spirituality Group   Description: Participant directed exploration of values, beliefs and meaning   Following a brief framework of chaplain's role and ground rules of group behavior, participants are invited to share concerns or questions that engage spiritual life. Emphasis placed on common themes and shared experiences and ways to make meaning and clarify living into one's values.   Theory/Process/Goal: Utilize the theoretical framework of group therapy established by Derrell Flight, Relational Cultural Theory and Rogerian approaches to facilitate relational empathy and use of the "here and now" to foster reflection, self-awareness, and sharing.   Observations: Majd was more reserved than peers but engaged when spoken to directly. He was appropriate in how he engaged chaplain and peers and polite.  Aiko Belko L. Minetta Aly, M.Div [phone]

## 2024-03-10 NOTE — Progress Notes (Deleted)
 [facility] MD Progress Note  03/10/2024 3:52 PM [name] [name]  MRN:  [id number]  Reason for admission: 33 year old AA male with previous hx of mental illness & inpatient psychiatric admission. [name] was a patient in this [facility] in April of 2022. At the time, he was having suicidal thoughts. After stabilization on that admission, he was discharged on medications with referral & appointment for outpatient psychiatric services. Patient is being re-admitted to the [facility] this time from the [facility] with complaint of complaint of worsening psychosis. He was apparently brought to the [facility] by the [employer]. After evaluation at [facility], patient was recommended & transferred to the [facility] for further psychiatric evaluation/treatments. A review of his current Gerre Kraft reports has shown his UDS was positive for THC.   Daily notes:  [name] [name] is seen in his room today, chart reviewed. The chart findings discussed with the treatment team during treatment team meeting this afternoon. He was sitting on the side of his bed. He presents groomed & has a new scrub on today. There no foul body odor noted today. He has been out of his room & in the day with the other patients today. This is an improvement compared to the other few days he isolated himself in his room. He is making a fair eye contact & verbally responsive. He reports, I'm doing well. I'm still not feeling depressed or anxious. I'm sleeping well. I'm taking my medicine. Can I go home today? [name] [name] is informed that he will not be discharged today as we are still titrating his medicines. His Abilify  was increased from 10 mg to 20 mg daily yesterday for mood control. Patient will receive his Abilify  maintena IM tomorrow 03-11-24. This provider did call patient's brother, [name] [name] for a collateral information to see how patient has been doing at home prior to this admission & what led to this present admission. However, [name] [name] did not answer his phone & his voice mail is full. Patient currently is  living with his brother [name] [name]). Reviewed vital signs, stable. There no new lab results. Patient is instructed & encouraged to to continue to come out of his room & participate in the group sessions. There are no behavioral issues reported by staff. Continue current plan of care as already in progress.  Principal Problem: Undifferentiated schizophrenia (HCC)  Diagnosis: Principal Problem:   Undifferentiated schizophrenia (HCC)  Total Time spent with patient: 45 minutes  Past Psychiatric History: See H&P.  Past Medical History:  Past Medical History:  Diagnosis Date   Low testosterone     Schizophrenia (HCC)    Tibial plateau fracture, left     Past Surgical History:  Procedure Laterality Date   APPENDECTOMY     LAPAROSCOPIC APPENDECTOMY N/A 07/16/2017   Procedure: APPENDECTOMY LAPAROSCOPIC;  Surgeon: Shela Derby, MD;  Location: [facility] OR;  Service: General;  Laterality: N/A;   LAPAROTOMY N/A 07/16/2017   Procedure: POSSIBLE OPEN  LAPAROTOMY;  Surgeon: Shela Derby, MD;  Location: [facility] OR;  Service: General;  Laterality: N/A;   NO PAST SURGERIES     ORIF TIBIA PLATEAU Left 04/15/2018   Procedure: OPEN REDUCTION INTERNAL FIXATION (ORIF) LEFT BICONDYLAR TIBIAL PLATEAU;  Surgeon: Wes Hamman, MD;  Location: [facility] OR;  Service: Orthopedics;  Laterality: Left;   Family History:  Family History  Problem Relation Age of Onset   Other Mother    Throat cancer Mother    Other Brother    Family Psychiatric  History: See H&P.  Social History:  Social  History   Substance and Sexual Activity  Alcohol Use Not Currently     Social History   Substance and Sexual Activity  Drug Use Not Currently   Types: Marijuana   Comment: denies use of marijuana     Social History   Socioeconomic History   Marital status: Single    Spouse name: Not on file   Number of children: Not on file   Years of education: Not on file   Highest education level: Not on file  Occupational History   Not  on file  Tobacco Use   Smoking status: Some Days    Current packs/day: 0.25    Average packs/day: 0.3 packs/day for 12.0 years (3.0 ttl pk-yrs)    Types: Cigarettes   Smokeless tobacco: Never  Vaping Use   Vaping status: Never Used  Substance and Sexual Activity   Alcohol use: Not Currently   Drug use: Not Currently    Types: Marijuana    Comment: denies use of marijuana    Sexual activity: Not Currently  Other Topics Concern   Not on file  Social History Narrative   Not on file   Social Drivers of Health   Financial Resource Strain: Not on file  Food Insecurity: No Food Insecurity (03/07/2024)   Hunger Vital Sign    Worried About Running Out of Food in the Last Year: Never true    Ran Out of Food in the Last Year: Never true  Transportation Needs: Unmet Transportation Needs (03/07/2024)   PRAPARE - Administrator, Civil Service (Medical): Yes    Lack of Transportation (Non-Medical): Yes  Physical Activity: Not on file  Stress: Not on file  Social Connections: Not on file   Additional Social History:    Sleep: Good Estimated Sleeping Duration (Last 24 Hours): 5.25-6.25 hours  Appetite:  Good  Current Medications: Current Facility-Administered Medications  Medication Dose Route Frequency Provider Last Rate Last Admin   acetaminophen  (TYLENOL ) tablet 650 mg  650 mg Oral Q6H PRN Buena Carmine, NP       alum & mag hydroxide-simeth (MAALOX/MYLANTA) 200-200-20 MG/5ML suspension 30 mL  30 mL Oral Q4H PRN Buena Carmine, NP       ARIPiprazole  (ABILIFY ) tablet 20 mg  20 mg Oral Daily Izediuno, Vincent A, MD   20 mg at 03/10/24 0748   [START ON 03/11/2024] ARIPiprazole  ER (ABILIFY  MAINTENA) injection 400 mg  400 mg Intramuscular Q28 days Izediuno, Iline Mallory, MD       haloperidol  (HALDOL ) tablet 5 mg  5 mg Oral TID PRN Buena Carmine, NP       And   diphenhydrAMINE  (BENADRYL ) capsule 50 mg  50 mg Oral TID PRN Buena Carmine, NP       haloperidol   lactate (HALDOL ) injection 5 mg  5 mg Intramuscular TID PRN Buena Carmine, NP       And   diphenhydrAMINE  (BENADRYL ) injection 50 mg  50 mg Intramuscular TID PRN Buena Carmine, NP       And   LORazepam  (ATIVAN ) injection 2 mg  2 mg Intramuscular TID PRN Buena Carmine, NP       haloperidol  lactate (HALDOL ) injection 10 mg  10 mg Intramuscular TID PRN Buena Carmine, NP       And   diphenhydrAMINE  (BENADRYL ) injection 50 mg  50 mg Intramuscular TID PRN Buena Carmine, NP       And   LORazepam  (ATIVAN )  injection 2 mg  2 mg Intramuscular TID PRN Buena Carmine, NP       hydrOXYzine  (ATARAX ) tablet 25 mg  25 mg Oral TID PRN Buena Carmine, NP   25 mg at 03/09/24 2112   magnesium  hydroxide (MILK OF MAGNESIA) suspension 30 mL  30 mL Oral Daily PRN Buena Carmine, NP       traZODone  (DESYREL ) tablet 50 mg  50 mg Oral QHS PRN Buena Carmine, NP   50 mg at 03/09/24 2112   Lab Results:  No results found for this or any previous visit (from the past 48 hours).  Blood Alcohol level:  Lab Results  Component Value Date   [facility] <15 03/06/2024   ETH <10 02/15/2021   Metabolic Disorder Labs: Lab Results  Component Value Date   HGBA1C 5.3 03/06/2024   MPG 105.41 03/06/2024   MPG 114.02 01/08/2021   Lab Results  Component Value Date   PROLACTIN 5.6 01/14/2015   Lab Results  Component Value Date   CHOL 182 03/06/2024   TRIG 67 03/06/2024   HDL 37 (L) 03/06/2024   CHOLHDL 4.9 03/06/2024   VLDL 13 03/06/2024   LDLCALC 132 (H) 03/06/2024   LDLCALC 109 (H) 01/08/2021   Physical Findings: AIMS:  ,  ,  ,  ,  ,  ,   CIWA:    COWS:     Musculoskeletal: Strength & Muscle Tone: within normal limits Gait & Station: normal Patient leans: N/A  Psychiatric Specialty Exam:  Presentation  General Appearance:  Casual; Fairly Groomed  Eye Contact: Fair  Speech: Clear and Coherent; Normal Rate  Speech Volume: Normal  Handedness: Right   Mood and  Affect  Mood: -- (Starting to improve.)  Affect: Blunt (slightly reactive.)  Thought Process  Thought Processes: Coherent  Descriptions of Associations:Intact  Orientation:Full (Time, Place and Person)  Thought Content:Rumination  History of Schizophrenia/Schizoaffective disorder:Yes  Duration of Psychotic Symptoms:Greater than six months  Hallucinations:Hallucinations: -- (Patient is internally preoccupied.)   Ideas of Reference:None  Suicidal Thoughts:Suicidal Thoughts: No   Homicidal Thoughts:Homicidal Thoughts: No    Sensorium  Memory: Immediate Good; Recent Good; Remote Fair  Judgment: Fair  Insight: Fair   Art therapist  Concentration: Fair  Attention Span: Fair  Recall: Fair  Fund of Knowledge: Fair  Language: Good  Psychomotor Activity  Psychomotor Activity: Psychomotor Activity: Normal   Assets  Assets: Communication Skills; Desire for Improvement; Housing; Physical Health; Resilience; Social Support  Sleep  Sleep: Sleep: Good Number of Hours of Sleep: 6.75   Physical Exam: Physical Exam Vitals and nursing note reviewed.  HENT:     Head: Normocephalic.     Nose: Nose normal.   Cardiovascular:     Rate and Rhythm: Normal rate.     Pulses: Normal pulses.  Pulmonary:     Effort: Pulmonary effort is normal.  Genitourinary:    Comments: Deferred  Musculoskeletal:        General: Normal range of motion.     Cervical back: Normal range of motion.   Neurological:     General: No focal deficit present.     Mental Status: He is oriented to person, place, and time.    Review of Systems  Constitutional:  Negative for chills, diaphoresis and fever.  HENT:  Negative for congestion and sore throat.   Respiratory:  Negative for cough, shortness of breath and wheezing.   Cardiovascular:  Negative for chest pain and palpitations.  Gastrointestinal:  Negative for abdominal pain, constipation, diarrhea, heartburn,  nausea and vomiting.  Genitourinary:  Negative for dysuria.  Musculoskeletal:  Negative for joint pain and myalgias.  Skin:  Negative for itching and rash.  Neurological:  Negative for dizziness, tingling, tremors, sensory change, speech change, focal weakness, seizures, loss of consciousness, weakness and headaches.  Endo/Heme/Allergies:        NKDA  Psychiatric/Behavioral:  Positive for substance abuse.    Blood pressure 112/74, pulse 80, temperature 97.8 F (36.6 C), temperature source Oral, resp. rate 20, height 5' 4 (1.626 m), weight 99.1 kg, SpO2 100%. Body mass index is 37.49 kg/m.  Treatment Plan Summary: Daily contact with patient to assess and evaluate symptoms and progress in treatment and Medication management.   Principal/active diagnoses.  Undifferentiated schizophrenia (HCC).  Cannabis use disorder.  Plan: The risks/benefits/side-effects/alternatives to the medications in use were discussed in detail with the patient and time was given for patient's questions. The patient consents to medication trial.    -Continue Abilify  20 mg po daily for mood control (Continue this med for 14 days after DC).  -Continue hydroxyzine  25 mg po tid prn for anxiety.  -Continue Trazodone  50 mg po Q bedtime prn for insomnia.  -Initiated Abilify  Maintena 400 mg IM Q 28 days (first dose on 03-11-24) for mood control.   Agitation protocols.  -Continue as recommended (See MAR).   Other PRNS -Continue Tylenol  650 mg every 6 hours PRN for mild pain -Continue Maalox 30 ml Q 4 hrs PRN for indigestion -Continue MOM 30 ml po Q 6 hrs for constipation   Safety and Monitoring: Voluntary admission to inpatient psychiatric unit for safety, stabilization and treatment Daily contact with patient to assess and evaluate symptoms and progress in treatment Patient's case to be discussed in multi-disciplinary team meeting Observation Level : q15 minute checks Vital signs: q12 hours Precautions: Safety    Discharge Planning: Social work and case management to assist with discharge planning and identification of hospital follow-up needs prior to discharge Estimated LOS: 5-7 days Discharge Concerns: Need to establish a safety plan; Medication compliance and effectiveness Discharge Goals: Return home with outpatient referrals for mental health follow-up including medication management/psychotherapy  Asuncion Layer, NP, pmhnp, fnp-bc. 03/10/2024, 3:52 PM Patient ID: [name], male   DOB: March 15, 1991, 33 y.o.   MRN: [id number]

## 2024-03-10 NOTE — Progress Notes (Signed)
   03/10/24 2025  Psych Admission Type (Psych Patients Only)  Admission Status Involuntary  Psychosocial Assessment  Patient Complaints None  Eye Contact Fair  Facial Expression Flat  Affect Appropriate to circumstance  Speech Logical/coherent  Interaction Minimal  Motor Activity Slow  Appearance/Hygiene Unremarkable  Behavior Characteristics Cooperative  Mood Preoccupied  Thought Process  Coherency WDL  Content WDL  Delusions None reported or observed  Perception Hallucinations  Hallucination Auditory  Judgment Impaired  Confusion None  Danger to Self  Current suicidal ideation? Denies  Danger to Others  Danger to Others None reported or observed

## 2024-03-10 NOTE — [facility] Group Notes (Signed)
 Adult Psychoeducational Group Note  Date:  03/10/2024 Time:  1:12 PM  Group Topic/Focus:  Goals Group:   The focus of this group is to help patients establish daily goals to achieve during treatment and discuss how the patient can incorporate goal setting into their daily lives to aide in recovery.  Participation Level:  Did Not Attend  Participation Quality:  \na  Affect:  na  Cognitive:  na  Insight: na  Engagement in Group:  na  Modes of Intervention:  na  Additional Comments:  na  [name] 03/10/2024, 1:12 PM

## 2024-03-11 NOTE — Progress Notes (Signed)
   03/11/24 1945  Psych Admission Type (Psych Patients Only)  Admission Status Involuntary  Psychosocial Assessment  Patient Complaints Other (Comment) (pt c/o feeling nauseous)  Eye Contact Fair  Facial Expression Flat  Affect Appropriate to circumstance  Speech Logical/coherent  Interaction Minimal  Motor Activity Slow  Appearance/Hygiene Unremarkable  Behavior Characteristics Calm  Mood Preoccupied;Pleasant  Thought Process  Coherency WDL  Content WDL  Delusions None reported or observed  Perception Hallucinations  Hallucination Auditory  Judgment Impaired  Confusion None  Danger to Self  Current suicidal ideation? Denies  Danger to Others  Danger to Others None reported or observed

## 2024-03-11 NOTE — Plan of Care (Signed)
   Problem: Education: Goal: Emotional status will improve Outcome: Progressing Goal: Mental status will improve Outcome: Progressing Goal: Verbalization of understanding the information provided will improve Outcome: Progressing

## 2024-03-11 NOTE — Plan of Care (Signed)
   Problem: Education: Goal: Emotional status will improve Outcome: Progressing Goal: Mental status will improve Outcome: Progressing   Problem: Activity: Goal: Interest or engagement in activities will improve Outcome: Progressing

## 2024-03-11 NOTE — Progress Notes (Signed)
 Patient complained of nausea. Stated that he also was woken up in the middle of the night with nausea. PRN Maalox administered, per MAR and ginger ale given. Patient encouraged to rest in bed after stating that it helped alleviate symptoms. Patient remains safe at this time.

## 2024-03-11 NOTE — [facility] Group Notes (Signed)
 Adult Psychoeducational Group Note  Date:  03/11/2024 Time:  8:30 PM  Group Topic/Focus:  Wrap-Up Group:   The focus of this group is to help patients review their daily goal of treatment and discuss progress on daily workbooks.  Participation Level:  Active  Participation Quality:  Appropriate  Affect:  Appropriate  Cognitive:  Appropriate  Insight: Appropriate  Engagement in Group:  Engaged  Modes of Intervention:  Discussion  Additional Comments:  Pt attended group.  Drue Pouch 03/11/2024, 8:30 PM

## 2024-03-11 NOTE — Progress Notes (Incomplete)
 Patient has maintained progress made on aripiprazole .  He had his long-acting injectable today.  No residual perceptual abnormalities.  No delusional preoccupation.  He is able to think clearly.  His thoughts are organized.  He is future oriented.  No communication with his brother yet but hopes to return back to his brother's home. We will keep his current regimen and evaluate him further.

## 2024-03-11 NOTE — Plan of Care (Signed)
  Problem: Education: Goal: Emotional status will improve Outcome: Progressing Goal: Mental status will improve Outcome: Progressing   Problem: Activity: Goal: Interest or engagement in activities will improve Outcome: Progressing   Problem: Coping: Goal: Ability to demonstrate self-control will improve Outcome: Progressing   Problem: Safety: Goal: Periods of time without injury will increase Outcome: Progressing

## 2024-03-11 NOTE — Progress Notes (Signed)
 Patient agreed to take Abilify  LAI. Medication administered, per MAR, without incident. Safety checks continue. Patient remains safe at this time.

## 2024-03-11 NOTE — Progress Notes (Cosign Needed Addendum)
 [facility] MD Progress Note  03/11/2024 1:35 PM [name] [name]  MRN:  [id number]  Reason for admission: 33 year old AA male with previous hx of mental illness & inpatient psychiatric admission. [name] was a patient in this [facility] in April of 2022. At the time, he was having suicidal thoughts. After stabilization on that admission, he was discharged on medications with referral & appointment for outpatient psychiatric services. Patient is being re-admitted to the [facility] this time from the [facility] with complaint of complaint of worsening psychosis. He was apparently brought to the [facility] by the [employer]. After evaluation at [facility], patient was recommended & transferred to the [facility] for further psychiatric evaluation/treatments. A review of his current mariea reports has shown his UDS was positive for THC.   Daily Note: The chart was reviewed today. The patient was discussed at the multidisciplinary team meeting.  Upon approach, the patient was observed lying in bed.  During today's assessment, the patient describes his mood as okay.  He reports sleep was pretty well.  Reports appetite is adequate, energy and concentration are fine, and he overall feels much better.  He denies paranoia, delusions, auditory or visual hallucinations.  He denies suicidal or homicidal ideation, intent, or plan.  He states he is feeling slightly nauseous today and will request antinausea medication if symptoms worsen.  He reports attending group sessions on the unit and finds them helpful.  He reports that he has not spoken to family yet, but he plans to call his father.  During the interview, he appeared somewhat internally preoccupied, exhibited occasional thought blocking, and had delayed responses.  He received his long-acting aripiprazole  injection today.  His mood is stable and improving, with no active suicidal or psychotic symptoms.  The case was discussed with the attending psychiatrist, continue current treatment regimen.   Principal  Problem: Undifferentiated schizophrenia (HCC)  Diagnosis: Principal Problem:   Undifferentiated schizophrenia (HCC)  Total Time spent with patient: 45 minutes  Past Psychiatric History: See H&P.  Past Medical History:  Past Medical History:  Diagnosis Date   Low testosterone     Schizophrenia (HCC)    Tibial plateau fracture, left     Past Surgical History:  Procedure Laterality Date   APPENDECTOMY     LAPAROSCOPIC APPENDECTOMY N/A 07/16/2017   Procedure: APPENDECTOMY LAPAROSCOPIC;  Surgeon: Rubin Calamity, MD;  Location: [facility] OR;  Service: General;  Laterality: N/A;   LAPAROTOMY N/A 07/16/2017   Procedure: POSSIBLE OPEN  LAPAROTOMY;  Surgeon: Rubin Calamity, MD;  Location: [facility] OR;  Service: General;  Laterality: N/A;   NO PAST SURGERIES     ORIF TIBIA PLATEAU Left 04/15/2018   Procedure: OPEN REDUCTION INTERNAL FIXATION (ORIF) LEFT BICONDYLAR TIBIAL PLATEAU;  Surgeon: Jerri Kay CHRISTELLA, MD;  Location: [facility] OR;  Service: Orthopedics;  Laterality: Left;   Family History:  Family History  Problem Relation Age of Onset   Other Mother    Throat cancer Mother    Other Brother    Family Psychiatric  History: See H&P.  Social History:  Social History   Substance and Sexual Activity  Alcohol Use Not Currently     Social History   Substance and Sexual Activity  Drug Use Not Currently   Types: Marijuana   Comment: denies use of marijuana     Social History   Socioeconomic History   Marital status: Single    Spouse name: Not on file   Number of children: Not on file   Years of education: Not  on file   Highest education level: Not on file  Occupational History   Not on file  Tobacco Use   Smoking status: Some Days    Current packs/day: 0.25    Average packs/day: 0.3 packs/day for 12.0 years (3.0 ttl pk-yrs)    Types: Cigarettes   Smokeless tobacco: Never  Vaping Use   Vaping status: Never Used  Substance and Sexual Activity   Alcohol use: Not Currently   Drug use:  Not Currently    Types: Marijuana    Comment: denies use of marijuana    Sexual activity: Not Currently  Other Topics Concern   Not on file  Social History Narrative   Not on file   Social Drivers of Health   Financial Resource Strain: Not on file  Food Insecurity: No Food Insecurity (03/07/2024)   Hunger Vital Sign    Worried About Running Out of Food in the Last Year: Never true    Ran Out of Food in the Last Year: Never true  Transportation Needs: Unmet Transportation Needs (03/07/2024)   PRAPARE - Administrator, Civil Service (Medical): Yes    Lack of Transportation (Non-Medical): Yes  Physical Activity: Not on file  Stress: Not on file  Social Connections: Not on file   Additional Social History:    Sleep: Good Estimated Sleeping Duration (Last 24 Hours): 6.25-8.00 hours  Appetite:  Good  Current Medications: Current Facility-Administered Medications  Medication Dose Route Frequency Provider Last Rate Last Admin   acetaminophen  (TYLENOL ) tablet 650 mg  650 mg Oral Q6H PRN Arloa Suzen RAMAN, NP       alum & mag hydroxide-simeth (MAALOX/MYLANTA) 200-200-20 MG/5ML suspension 30 mL  30 mL Oral Q4H PRN Arloa Suzen RAMAN, NP       ARIPiprazole  (ABILIFY ) tablet 20 mg  20 mg Oral Daily Izediuno, Vincent A, MD   20 mg at 03/11/24 0800   ARIPiprazole  ER (ABILIFY  MAINTENA) injection 400 mg  400 mg Intramuscular Q28 days Izediuno, Jerrell LABOR, MD   400 mg at 03/11/24 0936   haloperidol  (HALDOL ) tablet 5 mg  5 mg Oral TID PRN Arloa Suzen RAMAN, NP       And   diphenhydrAMINE  (BENADRYL ) capsule 50 mg  50 mg Oral TID PRN Arloa Suzen RAMAN, NP       haloperidol  lactate (HALDOL ) injection 5 mg  5 mg Intramuscular TID PRN Arloa Suzen RAMAN, NP       And   diphenhydrAMINE  (BENADRYL ) injection 50 mg  50 mg Intramuscular TID PRN Arloa Suzen RAMAN, NP       And   LORazepam  (ATIVAN ) injection 2 mg  2 mg Intramuscular TID PRN Arloa Suzen RAMAN, NP       haloperidol  lactate  (HALDOL ) injection 10 mg  10 mg Intramuscular TID PRN Arloa Suzen RAMAN, NP       And   diphenhydrAMINE  (BENADRYL ) injection 50 mg  50 mg Intramuscular TID PRN Arloa Suzen RAMAN, NP       And   LORazepam  (ATIVAN ) injection 2 mg  2 mg Intramuscular TID PRN Arloa Suzen RAMAN, NP       hydrOXYzine  (ATARAX ) tablet 25 mg  25 mg Oral TID PRN Arloa Suzen RAMAN, NP   25 mg at 03/10/24 2050   magnesium  hydroxide (MILK OF MAGNESIA) suspension 30 mL  30 mL Oral Daily PRN Arloa Suzen RAMAN, NP       traZODone  (DESYREL ) tablet 50 mg  50 mg Oral QHS  PRN Arloa Suzen RAMAN, NP   50 mg at 03/10/24 2050   Lab Results:  No results found for this or any previous visit (from the past 48 hours).  Blood Alcohol level:  Lab Results  Component Value Date   [facility] <15 03/06/2024   ETH <10 02/15/2021   Metabolic Disorder Labs: Lab Results  Component Value Date   HGBA1C 5.3 03/06/2024   MPG 105.41 03/06/2024   MPG 114.02 01/08/2021   Lab Results  Component Value Date   PROLACTIN 5.6 01/14/2015   Lab Results  Component Value Date   CHOL 182 03/06/2024   TRIG 67 03/06/2024   HDL 37 (L) 03/06/2024   CHOLHDL 4.9 03/06/2024   VLDL 13 03/06/2024   LDLCALC 132 (H) 03/06/2024   LDLCALC 109 (H) 01/08/2021   Physical Findings: AIMS:  ,  ,0  ,  ,  ,  ,   CIWA:   N/A COWS:   N/A  Musculoskeletal: Strength & Muscle Tone: within normal limits Gait & Station: normal Patient leans: N/A  Psychiatric Specialty Exam:  Presentation  General Appearance:  Casual; Fairly Groomed  Eye Contact: Fair  Speech: Clear and Coherent; Normal Rate  Speech Volume: Normal  Handedness: Right   Mood and Affect  Mood: -- (Starting to improve.)  Affect: Blunt (slightly reactive.)  Thought Process  Thought Processes: Coherent  Descriptions of Associations:Intact  Orientation:Full (Time, Place and Person)  Thought Content:Rumination  History of Schizophrenia/Schizoaffective  disorder:Yes  Duration of Psychotic Symptoms:Greater than six months  Hallucinations:Hallucinations: -- (Patient is internally preoccupied.)  Ideas of Reference:None  Suicidal Thoughts:Suicidal Thoughts: No  Homicidal Thoughts:Homicidal Thoughts: No   Sensorium  Memory: Immediate Good; Recent Good; Remote Fair  Judgment: Fair  Insight: Fair   Art therapist  Concentration: Fair  Attention Span: Fair  Recall: Fair  Fund of Knowledge: Fair  Language: Good  Psychomotor Activity  Psychomotor Activity: Psychomotor Activity: Normal  Assets  Assets: Communication Skills; Desire for Improvement; Housing; Physical Health; Resilience; Social Support  Sleep  Sleep: Sleep: Good Number of Hours of Sleep: 6.75  Physical Exam: Physical Exam Vitals and nursing note reviewed.  HENT:     Head: Normocephalic.     Nose: Nose normal.   Cardiovascular:     Rate and Rhythm: Normal rate.     Pulses: Normal pulses.  Pulmonary:     Effort: Pulmonary effort is normal.  Genitourinary:    Comments: Deferred  Musculoskeletal:        General: Normal range of motion.     Cervical back: Normal range of motion.   Neurological:     General: No focal deficit present.     Mental Status: He is oriented to person, place, and time.    Review of Systems  Constitutional:  Negative for chills, diaphoresis and fever.  HENT:  Negative for congestion and sore throat.   Respiratory:  Negative for cough, shortness of breath and wheezing.   Cardiovascular:  Negative for chest pain and palpitations.  Gastrointestinal:  Negative for abdominal pain, constipation, diarrhea, heartburn, nausea and vomiting.  Genitourinary:  Negative for dysuria.  Musculoskeletal:  Negative for joint pain and myalgias.  Skin:  Negative for itching and rash.  Neurological:  Negative for dizziness, tingling, tremors, sensory change, speech change, focal weakness, seizures, loss of consciousness,  weakness and headaches.  Endo/Heme/Allergies:        NKDA  Psychiatric/Behavioral:  Positive for substance abuse.    Blood pressure 124/78, pulse (!) 105, temperature  97.7 F (36.5 C), temperature source Oral, resp. rate 18, height 5' 4 (1.626 m), weight 99.1 kg, SpO2 100%. Body mass index is 37.49 kg/m.  Treatment Plan Summary: Daily contact with patient to assess and evaluate symptoms and progress in treatment and Medication management.   Principal/active diagnoses.  Undifferentiated schizophrenia (HCC).  Cannabis use disorder.  Plan: The risks/benefits/side-effects/alternatives to the medications in use were discussed in detail with the patient and time was given for patient's questions. The patient consents to medication trial.    -Continue Abilify  from 20 mg po daily for mood control.  -Continue hydroxyzine  25 mg po tid prn for anxiety.  -Continue Trazodone  50 mg po Q bedtime prn for insomnia.  -Initiated Abilify  Maintena 400 mg IM Q 28 days (first dose on March 11, 2024), for mood control   Agitation protocols.  -Continue as recommended (See MAR).   Other PRNS -Continue Tylenol  650 mg every 6 hours PRN for mild pain -Continue Maalox 30 ml Q 4 hrs PRN for indigestion -Continue MOM 30 ml po Q 6 hrs for constipation   Safety and Monitoring: Voluntary admission to inpatient psychiatric unit for safety, stabilization and treatment Daily contact with patient to assess and evaluate symptoms and progress in treatment Patient's case to be discussed in multi-disciplinary team meeting Observation Level : q15 minute checks Vital signs: q12 hours Precautions: Safety   Discharge Planning: Social work and case management to assist with discharge planning and identification of hospital follow-up needs prior to discharge Estimated LOS: 5-7 days Discharge Concerns: Need to establish a safety plan; Medication compliance and effectiveness Discharge Goals: Return home with outpatient  referrals for mental health follow-up including medication management/psychotherapy  I personally spent a total of 45 minutes in the care of the patient today including preparing to see the patient, counseling and educating, documenting clinical information in the EHR, and coordinating care.    Blair Chiquita Hint, NP,  03/11/2024, 1:35 PM Patient ID: Darold CHRISTELLA Horner, male   DOB: [birth year], 33 y.o.   MRN: [id number]

## 2024-03-11 NOTE — Progress Notes (Signed)
 D- Patient alert and oriented. Denies SI, HI, AVH, and pain. A- PO Abilify  administered to patient, per MAR. Patient refused Abilify  Maintaina. Medication education provided. Support and encouragement provided.  Routine safety checks conducted every 15 minutes.  Patient informed to notify staff with problems or concerns.  R- No adverse drug reactions noted. Patient contracts for safety at this time. Patient receptive, calm, and cooperative. Patient interacts well with others on the unit.  Patient remains safe at this time.     03/11/24 0800  Psych Admission Type (Psych Patients Only)  Admission Status Involuntary  Psychosocial Assessment  Patient Complaints None  Eye Contact Fair  Facial Expression Flat  Affect Flat  Speech Logical/coherent  Interaction Minimal  Motor Activity Slow  Appearance/Hygiene In scrubs  Behavior Characteristics Calm  Mood Preoccupied  Thought Process  Coherency WDL  Content WDL  Delusions None reported or observed  Perception Hallucinations  Hallucination Auditory  Judgment Impaired  Confusion None  Danger to Self  Current suicidal ideation? Denies  Danger to Others  Danger to Others None reported or observed

## 2024-03-12 NOTE — Progress Notes (Signed)
   03/12/24 1950  Psych Admission Type (Psych Patients Only)  Admission Status Involuntary  Psychosocial Assessment  Patient Complaints Anxiety  Eye Contact Fair  Facial Expression Flat  Affect Appropriate to circumstance  Speech Logical/coherent  Interaction Minimal  Motor Activity Slow  Appearance/Hygiene In scrubs;Disheveled;Body odor  Behavior Characteristics Anxious  Mood Pleasant;Preoccupied  Thought Process  Coherency WDL  Content WDL  Delusions None reported or observed  Perception Hallucinations  Hallucination Auditory  Judgment Impaired  Confusion None  Danger to Self  Current suicidal ideation? Denies  Danger to Others  Danger to Others None reported or observed

## 2024-03-12 NOTE — [facility] Group Notes (Signed)
 Adult Psychoeducational Group Note  Date:  03/12/2024 Time:  9:43 PM  Group Topic/Focus:  Wrap-Up Group:   The focus of this group is to help patients review their daily goal of treatment and discuss progress on daily workbooks.  Participation Level:  Active  Participation Quality:  Appropriate  Affect:  Appropriate  Cognitive:  Appropriate  Insight: Appropriate  Engagement in Group:  Engaged  Modes of Intervention:  Discussion and Support  Additional Comments:  Pt told that today was a good day on the unit, the highlight of which was going to the gym with his peers. On the subject of goals for the coming week, Pt mentioned wanting to stay positive, not have any outbursts, and go home. Pt rated his day a 7 out of 10.  [name] 03/12/2024, 9:43 PM

## 2024-03-12 NOTE — Progress Notes (Signed)
 [facility] MD Progress Note  03/12/2024 2:52 PM [name] [name]  MRN:  [id number]  Reason for admission: 33 year old AA male with previous hx of mental illness & inpatient psychiatric admission. [name] [name] was a patient in this [facility] in April of 2022. At the time, he was having suicidal thoughts. After stabilization on that admission, he was discharged on medications with referral & appointment for outpatient psychiatric services. Patient is being re-admitted to the [facility] this time from the [facility] with complaint of complaint of worsening psychosis. He was apparently brought to the [facility] by the [employer]. After evaluation at [facility], patient was recommended & transferred to the [facility] for further psychiatric evaluation/treatments. A review of his current mariea reports has shown his UDS was positive for THC.   Daily Note: The chart was reviewed today. The patient was discussed at the multidisciplinary team meeting.  Upon approach, the patient was observed lying in bed asleep. Upon awakening, the patient sat up on side of the bed.  During today's assessment, the patient reported that his mood is good.  He stated that he experienced a stomachache last night but was able to sleep well after receiving medication.  He states he declined breakfast this morning to avoid upsetting his stomach and indicated he may skip lunch as well.  He was encouraged to eat and informed that he may request bland food options such as applesauce or crackers from staff.  The patient reported having good energy and described his concentration as  adequate.  He denies suicidal or homicidal ideation, as well as any psychotic symptoms.  He also denied experiencing any current side effects from his psychiatric medications. He stated that his daily goal is to attend groups, maintain a clear and positive mindset, and avoid negative thoughts.  He reported that he has been feeling better since starting his current medication regimen.  The patient appears to be making  gradual progress; however, during the assessment, he seemed mildly preoccupied and exhibited some delayed responses and intermittent thought blocking.  The case was discussed with the attending psychiatrist, continue current treatment regimen.   Principal Problem: Undifferentiated schizophrenia (HCC)  Diagnosis: Principal Problem:   Undifferentiated schizophrenia (HCC)  Total Time spent with patient: 35 minutes   Past Psychiatric History: See H&P.  Past Medical History:  Past Medical History:  Diagnosis Date   Low testosterone     Schizophrenia (HCC)    Tibial plateau fracture, left     Past Surgical History:  Procedure Laterality Date   APPENDECTOMY     LAPAROSCOPIC APPENDECTOMY N/A 07/16/2017   Procedure: APPENDECTOMY LAPAROSCOPIC;  Surgeon: Rubin Calamity, MD;  Location: [facility] OR;  Service: General;  Laterality: N/A;   LAPAROTOMY N/A 07/16/2017   Procedure: POSSIBLE OPEN  LAPAROTOMY;  Surgeon: Rubin Calamity, MD;  Location: [facility] OR;  Service: General;  Laterality: N/A;   NO PAST SURGERIES     ORIF TIBIA PLATEAU Left 04/15/2018   Procedure: OPEN REDUCTION INTERNAL FIXATION (ORIF) LEFT BICONDYLAR TIBIAL PLATEAU;  Surgeon: Jerri Kay CHRISTELLA, MD;  Location: [facility] OR;  Service: Orthopedics;  Laterality: Left;   Family History:  Family History  Problem Relation Age of Onset   Other Mother    Throat cancer Mother    Other Brother    Family Psychiatric  History: See H&P.  Social History:  Social History   Substance and Sexual Activity  Alcohol Use Not Currently     Social History   Substance and Sexual Activity  Drug Use Not Currently  Types: Marijuana   Comment: denies use of marijuana     Social History   Socioeconomic History   Marital status: Single    Spouse name: Not on file   Number of children: Not on file   Years of education: Not on file   Highest education level: Not on file  Occupational History   Not on file  Tobacco Use   Smoking status: Some Days     Current packs/day: 0.25    Average packs/day: 0.3 packs/day for 12.0 years (3.0 ttl pk-yrs)    Types: Cigarettes   Smokeless tobacco: Never  Vaping Use   Vaping status: Never Used  Substance and Sexual Activity   Alcohol use: Not Currently   Drug use: Not Currently    Types: Marijuana    Comment: denies use of marijuana    Sexual activity: Not Currently  Other Topics Concern   Not on file  Social History Narrative   Not on file   Social Drivers of Health   Financial Resource Strain: Not on file  Food Insecurity: No Food Insecurity (03/07/2024)   Hunger Vital Sign    Worried About Running Out of Food in the Last Year: Never true    Ran Out of Food in the Last Year: Never true  Transportation Needs: Unmet Transportation Needs (03/07/2024)   PRAPARE - Administrator, Civil Service (Medical): Yes    Lack of Transportation (Non-Medical): Yes  Physical Activity: Not on file  Stress: Not on file  Social Connections: Not on file   Additional Social History:    Sleep: Good Estimated Sleeping Duration (Last 24 Hours): 3.25-3.75 hours  Appetite:  Good  Current Medications: Current Facility-Administered Medications  Medication Dose Route Frequency Provider Last Rate Last Admin   acetaminophen  (TYLENOL ) tablet 650 mg  650 mg Oral Q6H PRN Arloa Suzen RAMAN, NP       alum & mag hydroxide-simeth (MAALOX/MYLANTA) 200-200-20 MG/5ML suspension 30 mL  30 mL Oral Q4H PRN Arloa Suzen RAMAN, NP   30 mL at 03/11/24 1806   ARIPiprazole  (ABILIFY ) tablet 20 mg  20 mg Oral Daily Izediuno, Vincent A, MD   20 mg at 03/12/24 9266   ARIPiprazole  ER (ABILIFY  MAINTENA) injection 400 mg  400 mg Intramuscular Q28 days Hinda Jerrell LABOR, MD   400 mg at 03/11/24 9063   haloperidol  (HALDOL ) tablet 5 mg  5 mg Oral TID PRN Arloa Suzen RAMAN, NP       And   diphenhydrAMINE  (BENADRYL ) capsule 50 mg  50 mg Oral TID PRN Arloa Suzen RAMAN, NP       haloperidol  lactate (HALDOL ) injection 5 mg  5  mg Intramuscular TID PRN Arloa Suzen RAMAN, NP       And   diphenhydrAMINE  (BENADRYL ) injection 50 mg  50 mg Intramuscular TID PRN Arloa Suzen RAMAN, NP       And   LORazepam  (ATIVAN ) injection 2 mg  2 mg Intramuscular TID PRN Arloa Suzen RAMAN, NP       haloperidol  lactate (HALDOL ) injection 10 mg  10 mg Intramuscular TID PRN Arloa Suzen RAMAN, NP       And   diphenhydrAMINE  (BENADRYL ) injection 50 mg  50 mg Intramuscular TID PRN Arloa Suzen RAMAN, NP       And   LORazepam  (ATIVAN ) injection 2 mg  2 mg Intramuscular TID PRN Arloa Suzen RAMAN, NP       hydrOXYzine  (ATARAX ) tablet 25 mg  25 mg Oral  TID PRN Arloa Suzen RAMAN, NP   25 mg at 03/11/24 2124   magnesium  hydroxide (MILK OF MAGNESIA) suspension 30 mL  30 mL Oral Daily PRN Arloa Suzen RAMAN, NP       traZODone  (DESYREL ) tablet 50 mg  50 mg Oral QHS PRN Arloa Suzen RAMAN, NP   50 mg at 03/11/24 2124   Lab Results:  No results found for this or any previous visit (from the past 48 hours).  Blood Alcohol level:  Lab Results  Component Value Date   [facility] <15 03/06/2024   ETH <10 02/15/2021   Metabolic Disorder Labs: Lab Results  Component Value Date   HGBA1C 5.3 03/06/2024   MPG 105.41 03/06/2024   MPG 114.02 01/08/2021   Lab Results  Component Value Date   PROLACTIN 5.6 01/14/2015   Lab Results  Component Value Date   CHOL 182 03/06/2024   TRIG 67 03/06/2024   HDL 37 (L) 03/06/2024   CHOLHDL 4.9 03/06/2024   VLDL 13 03/06/2024   LDLCALC 132 (H) 03/06/2024   LDLCALC 109 (H) 01/08/2021   Physical Findings: AIMS:  ,  ,0  ,  ,  ,  ,   CIWA:   N/A COWS:   N/A  Musculoskeletal: Strength & Muscle Tone: within normal limits Gait & Station: normal Patient leans: N/A  Psychiatric Specialty Exam:  Presentation  General Appearance:  Casual; Fairly Groomed  Eye Contact: Fair  Speech: Clear and Coherent; Normal Rate  Speech Volume: Normal  Handedness: Right   Mood and Affect  Mood: -- (Starting  to improve.)  Affect: Blunt (slightly reactive.)  Thought Process  Thought Processes: Coherent  Descriptions of Associations:Intact  Orientation:Full (Time, Place and Person)  Thought Content:Rumination  History of Schizophrenia/Schizoaffective disorder:Yes  Duration of Psychotic Symptoms:Greater than six months  Hallucinations:No data recorded  Ideas of Reference:None  Suicidal Thoughts:No data recorded  Homicidal Thoughts:No data recorded   Sensorium  Memory: Immediate Good; Recent Good; Remote Fair  Judgment: Fair  Insight: Fair   Art therapist  Concentration: Fair  Attention Span: Fair  Recall: [employer] of Knowledge: Fair  Language: Good  Psychomotor Activity  Psychomotor Activity: No data recorded  Assets  Assets: Communication Skills; Desire for Improvement; Housing; Physical Health; Resilience; Social Support  Sleep  Sleep: No data recorded  Physical Exam: Physical Exam Vitals and nursing note reviewed.  HENT:     Head: Normocephalic.     Nose: Nose normal.   Cardiovascular:     Rate and Rhythm: Normal rate.     Pulses: Normal pulses.  Pulmonary:     Effort: Pulmonary effort is normal.  Genitourinary:    Comments: Deferred  Musculoskeletal:        General: Normal range of motion.     Cervical back: Normal range of motion.   Neurological:     General: No focal deficit present.     Mental Status: He is oriented to person, place, and time.    Review of Systems  Constitutional:  Negative for chills, diaphoresis and fever.  HENT:  Negative for congestion and sore throat.   Respiratory:  Negative for cough, shortness of breath and wheezing.   Cardiovascular:  Negative for chest pain and palpitations.  Gastrointestinal:  Negative for abdominal pain, constipation, diarrhea, heartburn, nausea and vomiting.  Genitourinary:  Negative for dysuria.  Musculoskeletal:  Negative for joint pain and myalgias.  Skin:   Negative for itching and rash.  Neurological:  Negative for dizziness, tingling,  tremors, sensory change, speech change, focal weakness, seizures, loss of consciousness, weakness and headaches.  Endo/Heme/Allergies:        NKDA  Psychiatric/Behavioral:  Positive for substance abuse.    Blood pressure (!) 122/93, pulse 95, temperature 98 F (36.7 C), temperature source Oral, resp. rate 18, height 5' 4 (1.626 m), weight 99.1 kg, SpO2 99%. Body mass index is 37.49 kg/m.  Treatment Plan Summary: Daily contact with patient to assess and evaluate symptoms and progress in treatment and Medication management.   Principal/active diagnoses.  Undifferentiated schizophrenia (HCC).  Cannabis use disorder.  Plan: The risks/benefits/side-effects/alternatives to the medications in use were discussed in detail with the patient and time was given for patient's questions. The patient consents to medication trial.    -Continue Abilify  from 20 mg po daily for mood control.  -Continue hydroxyzine  25 mg po tid prn for anxiety.  -Continue Trazodone  50 mg po Q bedtime prn for insomnia.  -Initiated Abilify  Maintena 400 mg IM Q 28 days (first dose on March 11, 2024), for mood control   Agitation protocols.  -Continue as recommended (See MAR).   Other PRNS -Continue Tylenol  650 mg every 6 hours PRN for mild pain -Continue Maalox 30 ml Q 4 hrs PRN for indigestion -Continue MOM 30 ml po Q 6 hrs for constipation   Safety and Monitoring: Voluntary admission to inpatient psychiatric unit for safety, stabilization and treatment Daily contact with patient to assess and evaluate symptoms and progress in treatment Patient's case to be discussed in multi-disciplinary team meeting Observation Level : q15 minute checks Vital signs: q12 hours Precautions: Safety   Discharge Planning: Social work and case management to assist with discharge planning and identification of hospital follow-up needs prior to  discharge Estimated LOS: 5-7 days Discharge Concerns: Need to establish a safety plan; Medication compliance and effectiveness Discharge Goals: Return home with outpatient referrals for mental health follow-up including medication management/psychotherapy  I personally spent a total of 35 minutes in the care of the patient today including preparing to see the patient, counseling and educating, documenting clinical information in the EHR, and coordinating care.    Blair Chiquita Hint, NP,  03/12/2024, 2:52 PM Patient ID: [name] [name], male   DOB: 08-07-1991, 33 y.o.   MRN: [id number] Patient ID: [name] [name], male   DOB: September 06, 1991, 33 y.o.   MRN: [id number]

## 2024-03-12 NOTE — [facility] Group Notes (Signed)
 Adult Psychoeducational Group Note  Date:  03/12/2024 Time:  10:51 AM  Group Topic/Focus:  Orientation:   The focus of this group is to educate the patient on the purpose and policies of crisis stabilization and provide a format to answer questions about their admission.  The group details unit policies and expectations of patients while admitted.  Participation Level:  Did Not Attend   Niel CHRISTELLA Nightingale 03/12/2024, 10:51 AM

## 2024-03-12 NOTE — Progress Notes (Signed)
 Patient is gradually responding to treatment.  He is tolerating his medicines well.  No imminent dangerousness.  He still has residual negative symptoms.  We will maintain oral aripiprazole  for eight more days.  Hopefully he can return back to his brother's place later this week.

## 2024-03-12 NOTE — Plan of Care (Signed)
  Problem: Education: Goal: Emotional status will improve Outcome: Progressing   Problem: Activity: Goal: Interest or engagement in activities will improve Outcome: Progressing Goal: Sleeping patterns will improve Outcome: Progressing   Problem: Coping: Goal: Ability to demonstrate self-control will improve Outcome: Progressing

## 2024-03-13 ENCOUNTER — Encounter (HOSPITAL_COMMUNITY): Payer: Self-pay

## 2024-03-13 LAB — FOLATE: Folate: 11.7 ng/mL (ref >5.9)

## 2024-03-13 LAB — VITAMIN B12: Vitamin B-12: 346 pg/mL (ref 180–914)

## 2024-03-13 NOTE — Group Note (Signed)
 Recreation Therapy Group Note   Group Topic:Problem Solving  Group Date: 03/13/2024 Start Time: 1012 End Time: 1045 Facilitators: Taneisha Fuson-McCall, [name] [name],[name] [name] Location: [address] Dayroom   Group Topic: Communication, Team Building, Problem Solving   Goal Area(s) Addresses:  Patient will effectively work with peer towards shared goal.  Patient will identify skills used to make activity successful.  Patient will share challenges and verbalize solution-driven approaches used. Patient will identify how skills used during activity can be used to reach post d/c goals.    Behavioral Response:    Intervention: STEM Activity    Activity: [employer]. Patients were provided the following materials: 5 drinking straws, 5 rubber bands, 5 paper clips, 2 index cards, and 2 drinking cups. Using the provided materials patients were asked to build a launching mechanism to launch a ping pong ball across the room, approximately 10 feet. Patients were divided into teams of 3-5. Instructions required all materials be incorporated into the device, functionality of items left to the peer group's discretion.   Education: Pharmacist, community, Scientist, physiological, Air cabin crew, Building control surveyor.    Education Outcome: Acknowledges education/In group clarification offered/Needs additional education.    Affect/Mood: Appropriate   Participation Level: Engaged   Participation Quality: Independent   Behavior: Appropriate   Speech/Thought Process: Focused   Insight: Good   Judgement: Good   Modes of Intervention: Problem-solving   Patient Response to Interventions:  Engaged   Education Outcome:  Acknowledges education   Clinical Observations/Individualized Feedback: Pt was bright and engaged during group. Pt thought of the concept of the launcher for his group. Pt worked well with peer in putting the launcher together and being successful.     Plan: Continue to engage patient in RT group sessions  2-3x/week.   [name] [name], [name] [name],[name] [name] 03/13/2024 1:44 PM

## 2024-03-13 NOTE — Group Note (Signed)
 Date:  03/13/2024 Time:  9:11 PM  Group Topic/Focus:  Wrap-Up Group:   The focus of this group is to help patients review their daily goal of treatment and discuss progress on daily workbooks.    Participation Level:  Active  Participation Quality:  Appropriate  Affect:  Appropriate  Cognitive:  Appropriate  Insight: Appropriate  Engagement in Group:  Developing/Improving  Modes of Intervention:  Discussion  Additional Comments:  Pt stated his goal for today was to focus on his treatment plan. Pt stated he accomplished his goal today. Pt stated he talked with his doctor and social worker about his care today. Pt rated his overall day a 7 out of 10. Pt stated he made no calls today. Pt stated he felt better about himself today. Pt stated he was able to attend all meals. Pt stated he took all medications provided today. Pt stated he attend all groups held today. Pt stated his appetite was poor today. Pt rated sleep last night was poor. Pt stated the goal tonight was to get some rest. Pt stated he had no physical pain tonight. Pt deny visual hallucinations and auditory issues tonight. Pt denies thoughts of harming himself or others. Pt stated he would alert staff if anything change  Lonni Na 03/13/2024, 9:11 PM

## 2024-03-13 NOTE — [facility] Suicide Risk Assessment (Addendum)
 [facility] INPATIENT:  Family/Significant Other Suicide Prevention Education  Suicide Prevention Education:  Contact Attempts: [name] [name] (brother) [phone], (name of family member/significant other) has been identified by the patient as the family member/significant other with whom the patient will be residing, and identified as the person(s) who will aid the patient in the event of a mental health crisis.  With written consent from the patient, two attempts were made to provide suicide prevention education, prior to and/or following the patient's discharge.  We were unsuccessful in providing suicide prevention education.  A suicide education pamphlet was given to the patient to share with family/significant other.  Date and time of first attempt: 03/13/2034 / 10:30 AM and 6:02 PM  The message said, The mailbox is full and can't accept any new messages at this time.   [name] [name], LCSWA 03/13/2024, 10:33 AM

## 2024-03-13 NOTE — BH IP Treatment Plan (Signed)
 Interdisciplinary Treatment and Diagnostic Plan Update  03/13/2024 Time of Session: 12:05 PM - UPDATE [name] [name] MRN: [id number]  Principal Diagnosis: Undifferentiated schizophrenia [facility])  Secondary Diagnoses: Principal Problem:   Undifferentiated schizophrenia (HCC)   Current Medications:  Current Facility-Administered Medications  Medication Dose Route Frequency Provider Last Rate Last Admin   acetaminophen  (TYLENOL ) tablet 650 mg  650 mg Oral Q6H PRN Arloa Suzen RAMAN, NP       alum & mag hydroxide-simeth (MAALOX/MYLANTA) 200-200-20 MG/5ML suspension 30 mL  30 mL Oral Q4H PRN Arloa Suzen RAMAN, NP   30 mL at 03/11/24 1806   ARIPiprazole  (ABILIFY ) tablet 20 mg  20 mg Oral Daily Izediuno, Vincent A, MD   20 mg at 03/13/24 0854   ARIPiprazole  ER (ABILIFY  MAINTENA) injection 400 mg  400 mg Intramuscular Q28 days Izediuno, Vincent A, MD   400 mg at 03/11/24 0936   haloperidol  (HALDOL ) tablet 5 mg  5 mg Oral TID PRN Arloa Suzen RAMAN, NP       And   diphenhydrAMINE  (BENADRYL ) capsule 50 mg  50 mg Oral TID PRN Arloa Suzen RAMAN, NP       haloperidol  lactate (HALDOL ) injection 5 mg  5 mg Intramuscular TID PRN Arloa Suzen RAMAN, NP       And   diphenhydrAMINE  (BENADRYL ) injection 50 mg  50 mg Intramuscular TID PRN Arloa Suzen RAMAN, NP       And   LORazepam  (ATIVAN ) injection 2 mg  2 mg Intramuscular TID PRN Arloa Suzen RAMAN, NP       haloperidol  lactate (HALDOL ) injection 10 mg  10 mg Intramuscular TID PRN Arloa Suzen RAMAN, NP       And   diphenhydrAMINE  (BENADRYL ) injection 50 mg  50 mg Intramuscular TID PRN Arloa Suzen RAMAN, NP       And   LORazepam  (ATIVAN ) injection 2 mg  2 mg Intramuscular TID PRN Arloa Suzen RAMAN, NP       hydrOXYzine  (ATARAX ) tablet 25 mg  25 mg Oral TID PRN Arloa Suzen RAMAN, NP   25 mg at 03/11/24 2124   magnesium  hydroxide (MILK OF MAGNESIA) suspension 30 mL  30 mL Oral Daily PRN Arloa Suzen RAMAN, NP       traZODone  (DESYREL ) tablet 50  mg  50 mg Oral QHS PRN Arloa Suzen RAMAN, NP   50 mg at 03/11/24 2124   PTA Medications: No medications prior to admission.    Patient Stressors: Financial difficulties   Loss of parent    Patient Strengths: General fund of knowledge  Supportive family/friends   Treatment Modalities: Medication Management, Group therapy, Case management,  1 to 1 session with clinician, Psychoeducation, Recreational therapy.   Physician Treatment Plan for Primary Diagnosis: Undifferentiated schizophrenia (HCC) Long Term Goal(s): Improvement in symptoms so as ready for discharge   Short Term Goals: Ability to identify and develop effective coping behaviors will improve Compliance with prescribed medications will improve Ability to identify triggers associated with substance abuse/mental health issues will improve Ability to identify changes in lifestyle to reduce recurrence of condition will improve Ability to verbalize feelings will improve Ability to disclose and discuss suicidal ideas  Medication Management: Evaluate patient's response, side effects, and tolerance of medication regimen.  Therapeutic Interventions: 1 to 1 sessions, Unit Group sessions and Medication administration.  Evaluation of Outcomes: Progressing  Physician Treatment Plan for Secondary Diagnosis: Principal Problem:   Undifferentiated schizophrenia (HCC)  Long Term Goal(s): Improvement in symptoms so as ready  for discharge   Short Term Goals: Ability to identify and develop effective coping behaviors will improve Compliance with prescribed medications will improve Ability to identify triggers associated with substance abuse/mental health issues will improve Ability to identify changes in lifestyle to reduce recurrence of condition will improve Ability to verbalize feelings will improve Ability to disclose and discuss suicidal ideas     Medication Management: Evaluate patient's response, side effects, and tolerance of  medication regimen.  Therapeutic Interventions: 1 to 1 sessions, Unit Group sessions and Medication administration.  Evaluation of Outcomes: Progressing   RN Treatment Plan for Primary Diagnosis: Undifferentiated schizophrenia (HCC) Long Term Goal(s): Knowledge of disease and therapeutic regimen to maintain health will improve  Short Term Goals: Ability to remain free from injury will improve, Ability to verbalize frustration and anger appropriately will improve, Ability to verbalize feelings will improve, and Ability to disclose and discuss suicidal ideas  Medication Management: RN will administer medications as ordered by provider, will assess and evaluate patient's response and provide education to patient for prescribed medication. RN will report any adverse and/or side effects to prescribing provider.  Therapeutic Interventions: 1 on 1 counseling sessions, Psychoeducation, Medication administration, Evaluate responses to treatment, Monitor vital signs and CBGs as ordered, Perform/monitor CIWA, COWS, AIMS and Fall Risk screenings as ordered, Perform wound care treatments as ordered.  Evaluation of Outcomes: Progressing   LCSW Treatment Plan for Primary Diagnosis: Undifferentiated schizophrenia (HCC) Long Term Goal(s): Safe transition to appropriate next level of care at discharge, Engage patient in therapeutic group addressing interpersonal concerns.  Short Term Goals: Engage patient in aftercare planning with referrals and resources, Increase ability to appropriately verbalize feelings, Facilitate acceptance of mental health diagnosis and concerns, and Identify triggers associated with mental health/substance abuse issues  Therapeutic Interventions: Assess for all discharge needs, 1 to 1 time with Social worker, Explore available resources and support systems, Assess for adequacy in community support network, Educate family and significant other(s) on suicide prevention, Complete  Psychosocial Assessment, Interpersonal group therapy.  Evaluation of Outcomes: Progressing   Progress in Treatment: Attending groups: Yes. Participating in groups: Yes. Taking medication as prescribed: Yes. Toleration medication: Yes. Family/Significant other contact made: No, will contact:  Yael Angerer (brother) [phone] - attempted to contact on 03/13/2024 at 10:30 AM Patient understands diagnosis: Yes. Discussing patient identified problems/goals with staff: Yes. Medical problems stabilized or resolved: Yes. Denies suicidal/homicidal ideation: Yes. Issues/concerns per patient self-inventory: No.     New problem(s) identified: No, Describe:  none   New Short Term/Long Term Goal(s): medication stabilization, elimination of SI thoughts, development of comprehensive mental wellness plan.      Patient Goals:  I want to stop hearing voices and make sure I am on my medication   Discharge Plan or Barriers: Patient recently admitted. CSW will continue to follow and assess for appropriate referrals and possible discharge planning.      Reason for Continuation of Hospitalization: Hallucinations Medication stabilization   Estimated Length of Stay: 4 - 6 days  Last 3 [place] Suicide Severity Risk Score: Flowsheet Row Ad[facility] (Current) [facility] from [facility]06/2024 in [facility] from 02/01/2023 in [facility] Department at [facility]  C-SSRS RISK CATEGORY No Risk No Risk No Risk    Last PHQ 2/9 Scores:    03/20/2020    5:08 PM 10/26/2019    2:16 PM  Depression screen PHQ 2/9  Decreased Interest 1 0  Down, Depressed, Hopeless 1 0  PHQ - 2 Score 2 0  Altered sleeping 1 0  Tired, decreased energy 1 0  Change in appetite 1 0  Feeling bad or failure about yourself  1 0  Trouble concentrating 1 0  Moving slowly or fidgety/restless 0 0  Suicidal thoughts 0 0  PHQ-9  Score 7 0  Difficult doing work/chores Somewhat difficult     Scribe for Treatment Team: Trae Bovenzi O Brayli Klingbeil, LCSWA 03/13/2024 5:16 PM

## 2024-03-13 NOTE — Plan of Care (Signed)
   Problem: Activity: Goal: Interest or engagement in activities will improve Outcome: Progressing

## 2024-03-13 NOTE — Progress Notes (Signed)
 [facility] MD Progress Note  03/13/2024 6:49 PM [name] [name]  MRN:  [id number]  Reason for admission: 33 year old AA male with previous hx of mental illness & inpatient psychiatric admission. [name] [name] was a patient in this [facility] in April of 2022. At the time, he was having suicidal thoughts. After stabilization on that admission, he was discharged on medications with referral & appointment for outpatient psychiatric services. Patient is being re-admitted to the [facility] this time from the [facility] with complaint of complaint of worsening psychosis. He was apparently brought to the [facility] by the [employer]. After evaluation at [facility], patient was recommended & transferred to the [facility] for further psychiatric evaluation/treatments. A review of his current mariea reports has shown his UDS was positive for THC.   Daily Note: The chart was reviewed today. The patient was discussed at the multidisciplinary team meeting.  Upon approach, the patient was observed sitting in the day room watching a movie on television.  He agreed to proceed to his room for private assessment.  On today's assessment, the patient reported his mood as doing good.  He noted some difficulty staying asleep the previous night due to restlessness.  He reported that he did not eat dinner yesterday evening because he felt sick but was able to eat a small amount of breakfast this morning.  He currently denies any nausea.  Energy level is reported as normal, and he denies any issues with concentration.  The patient denies suicidal ideation, intent, or plan.  He also denied experiencing auditory or visual hallucinations, paranoia, or delusional thinking.  He reported no current side effects from his psychiatric medications, though he mentioned having a stomachache earlier in the day, which has since resolved.   He stated that he is attending groups on the unit and finds them beneficial.  He reports his goal is to be discharged, stating, I am just waiting until they think I  am okay, but I think I am okay.  He indicated plans to see a therapist after discharge and was encouraged to follow through with outpatient appointments and continue taking medications as prescribed to reduce the risk of symptom exacerbation.  The patient reported that he has not yet spoken with his family but plans to contact his father if he cannot reach his brother.  He noted that his brother has been unresponsive to calls, likely due to being ill and on medication.  The patient appears to be gradually improving.  Notably, there was less evidence of preoccupation and thought blocking at today's encounter.  Efforts will continue to reach his brother (with whom he resides) to assist with discharge planning.  Anticipated discharge is likely later this week, pending continued stabilization and coordination of support. The case was discussed with the attending psychiatrist, continue current treatment regimen.   Principal Problem: Undifferentiated schizophrenia (HCC)  Diagnosis: Principal Problem:   Undifferentiated schizophrenia (HCC)  Total Time spent with patient: 35 minutes   Past Psychiatric History: See H&P.  Past Medical History:  Past Medical History:  Diagnosis Date   Low testosterone     Schizophrenia (HCC)    Tibial plateau fracture, left     Past Surgical History:  Procedure Laterality Date   APPENDECTOMY     LAPAROSCOPIC APPENDECTOMY N/A 07/16/2017   Procedure: APPENDECTOMY LAPAROSCOPIC;  Surgeon: Rubin Calamity, MD;  Location: [facility] OR;  Service: General;  Laterality: N/A;   LAPAROTOMY N/A 07/16/2017   Procedure: POSSIBLE OPEN  LAPAROTOMY;  Surgeon: Rubin Calamity, MD;  Location:  [facility] OR;  Service: General;  Laterality: N/A;   NO PAST SURGERIES     ORIF TIBIA PLATEAU Left 04/15/2018   Procedure: OPEN REDUCTION INTERNAL FIXATION (ORIF) LEFT BICONDYLAR TIBIAL PLATEAU;  Surgeon: Jerri Kay HERO, MD;  Location: [facility] OR;  Service: Orthopedics;  Laterality: Left;   Family History:   Family History  Problem Relation Age of Onset   Other Mother    Throat cancer Mother    Other Brother    Family Psychiatric  History: See H&P.  Social History:  Social History   Substance and Sexual Activity  Alcohol Use Not Currently     Social History   Substance and Sexual Activity  Drug Use Not Currently   Types: Marijuana   Comment: denies use of marijuana     Social History   Socioeconomic History   Marital status: Single    Spouse name: Not on file   Number of children: Not on file   Years of education: Not on file   Highest education level: Not on file  Occupational History   Not on file  Tobacco Use   Smoking status: Some Days    Current packs/day: 0.25    Average packs/day: 0.3 packs/day for 12.0 years (3.0 ttl pk-yrs)    Types: Cigarettes   Smokeless tobacco: Never  Vaping Use   Vaping status: Never Used  Substance and Sexual Activity   Alcohol use: Not Currently   Drug use: Not Currently    Types: Marijuana    Comment: denies use of marijuana    Sexual activity: Not Currently  Other Topics Concern   Not on file  Social History Narrative   Not on file   Social Drivers of Health   Financial Resource Strain: Not on file  Food Insecurity: No Food Insecurity (03/07/2024)   Hunger Vital Sign    Worried About Running Out of Food in the Last Year: Never true    Ran Out of Food in the Last Year: Never true  Transportation Needs: Unmet Transportation Needs (03/07/2024)   PRAPARE - Administrator, Civil Service (Medical): Yes    Lack of Transportation (Non-Medical): Yes  Physical Activity: Not on file  Stress: Not on file  Social Connections: Not on file   Additional Social History:    Sleep: Good Estimated Sleeping Duration (Last 24 Hours): 4.25-4.75 hours  Appetite:  Good  Current Medications: Current Facility-Administered Medications  Medication Dose Route Frequency Provider Last Rate Last Admin   acetaminophen  (TYLENOL )  tablet 650 mg  650 mg Oral Q6H PRN Arloa Suzen RAMAN, NP       alum & mag hydroxide-simeth (MAALOX/MYLANTA) 200-200-20 MG/5ML suspension 30 mL  30 mL Oral Q4H PRN Arloa Suzen RAMAN, NP   30 mL at 03/11/24 1806   ARIPiprazole  (ABILIFY ) tablet 20 mg  20 mg Oral Daily Izediuno, Vincent A, MD   20 mg at 03/13/24 0854   ARIPiprazole  ER (ABILIFY  MAINTENA) injection 400 mg  400 mg Intramuscular Q28 days Izediuno, Jerrell LABOR, MD   400 mg at 03/11/24 9063   haloperidol  (HALDOL ) tablet 5 mg  5 mg Oral TID PRN Arloa Suzen RAMAN, NP       And   diphenhydrAMINE  (BENADRYL ) capsule 50 mg  50 mg Oral TID PRN Arloa Suzen RAMAN, NP       haloperidol  lactate (HALDOL ) injection 5 mg  5 mg Intramuscular TID PRN Arloa Suzen RAMAN, NP       And   diphenhydrAMINE  (  BENADRYL ) injection 50 mg  50 mg Intramuscular TID PRN Arloa Suzen RAMAN, NP       And   LORazepam  (ATIVAN ) injection 2 mg  2 mg Intramuscular TID PRN Arloa Suzen RAMAN, NP       haloperidol  lactate (HALDOL ) injection 10 mg  10 mg Intramuscular TID PRN Arloa Suzen RAMAN, NP       And   diphenhydrAMINE  (BENADRYL ) injection 50 mg  50 mg Intramuscular TID PRN Arloa Suzen RAMAN, NP       And   LORazepam  (ATIVAN ) injection 2 mg  2 mg Intramuscular TID PRN Arloa Suzen RAMAN, NP       hydrOXYzine  (ATARAX ) tablet 25 mg  25 mg Oral TID PRN Arloa Suzen RAMAN, NP   25 mg at 03/11/24 2124   magnesium  hydroxide (MILK OF MAGNESIA) suspension 30 mL  30 mL Oral Daily PRN Arloa Suzen RAMAN, NP       traZODone  (DESYREL ) tablet 50 mg  50 mg Oral QHS PRN Arloa Suzen RAMAN, NP   50 mg at 03/11/24 2124   Lab Results:  No results found for this or any previous visit (from the past 48 hours).  Blood Alcohol level:  Lab Results  Component Value Date   [facility] <15 03/06/2024   ETH <10 02/15/2021   Metabolic Disorder Labs: Lab Results  Component Value Date   HGBA1C 5.3 03/06/2024   MPG 105.41 03/06/2024   MPG 114.02 01/08/2021   Lab Results  Component Value  Date   PROLACTIN 5.6 01/14/2015   Lab Results  Component Value Date   CHOL 182 03/06/2024   TRIG 67 03/06/2024   HDL 37 (L) 03/06/2024   CHOLHDL 4.9 03/06/2024   VLDL 13 03/06/2024   LDLCALC 132 (H) 03/06/2024   LDLCALC 109 (H) 01/08/2021   Physical Findings: AIMS:  ,  ,0  ,  ,  ,  ,   CIWA:   N/A COWS:   N/A  Musculoskeletal: Strength & Muscle Tone: within normal limits Gait & Station: normal Patient leans: N/A  Psychiatric Specialty Exam:  Presentation  General Appearance:  Casual; Fairly Groomed  Eye Contact: Fair  Speech: Clear and Coherent  Speech Volume: Normal  Handedness: Right   Mood and Affect  Mood: -- (Doing good.)  Affect: Blunt  Thought Process  Thought Processes: Goal Directed; Linear  Descriptions of Associations:Intact  Orientation:Full (Time, Place and Person)  Thought Content:Logical  History of Schizophrenia/Schizoaffective disorder:Yes  Duration of Psychotic Symptoms:Greater than six months  Hallucinations:Hallucinations: None   Ideas of Reference:None  Suicidal Thoughts:Suicidal Thoughts: No   Homicidal Thoughts:Homicidal Thoughts: No    Sensorium  Memory: Immediate Good; Recent Good  Judgment: Fair  Insight: Fair   Art therapist  Concentration: Fair  Attention Span: Fair  Recall: Good  Fund of Knowledge: Fair  Language: Fair  Psychomotor Activity  Psychomotor Activity: Psychomotor Activity: Normal   Assets  Assets: Desire for Improvement; Physical Health; Resilience  Sleep  Sleep: Sleep: Fair   Physical Exam: Physical Exam Vitals and nursing note reviewed.  HENT:     Head: Normocephalic.     Nose: Nose normal.   Cardiovascular:     Rate and Rhythm: Normal rate.     Pulses: Normal pulses.  Pulmonary:     Effort: Pulmonary effort is normal.  Genitourinary:    Comments: Deferred  Musculoskeletal:        General: Normal range of motion.     Cervical back:  Normal range of  motion.   Neurological:     General: No focal deficit present.     Mental Status: He is oriented to person, place, and time.    Review of Systems  Constitutional:  Negative for chills, diaphoresis and fever.  HENT:  Negative for congestion and sore throat.   Respiratory:  Negative for cough, shortness of breath and wheezing.   Cardiovascular:  Negative for chest pain and palpitations.  Gastrointestinal:  Negative for abdominal pain, constipation, diarrhea, heartburn, nausea and vomiting.  Genitourinary:  Negative for dysuria.  Musculoskeletal:  Negative for joint pain and myalgias.  Skin:  Negative for itching and rash.  Neurological:  Negative for dizziness, tingling, tremors, sensory change, speech change, focal weakness, seizures, loss of consciousness, weakness and headaches.  Endo/Heme/Allergies:        NKDA  Psychiatric/Behavioral:  Positive for substance abuse.    Blood pressure 116/82, pulse 92, temperature 98.6 F (37 C), resp. rate 16, height 5' 4 (1.626 m), weight 99.1 kg, SpO2 100%. Body mass index is 37.49 kg/m.  Treatment Plan Summary: Daily contact with patient to assess and evaluate symptoms and progress in treatment and Medication management.   Principal/active diagnoses.  Undifferentiated schizophrenia (HCC).  Cannabis use disorder.  Plan: The risks/benefits/side-effects/alternatives to the medications in use were discussed in detail with the patient and time was given for patient's questions. The patient consents to medication trial.    -Continue Abilify  from 20 mg po daily for mood control.  -Continue hydroxyzine  25 mg po tid prn for anxiety.  -Continue Trazodone  50 mg po Q bedtime prn for insomnia.  -Initiated Abilify  Maintena 400 mg IM Q 28 days (first dose on March 11, 2024), for mood control   Agitation protocols.  -Continue as recommended (See MAR).   Other PRNS -Continue Tylenol  650 mg every 6 hours PRN for mild pain -Continue  Maalox 30 ml Q 4 hrs PRN for indigestion -Continue MOM 30 ml po Q 6 hrs for constipation   Safety and Monitoring: Voluntary admission to inpatient psychiatric unit for safety, stabilization and treatment Daily contact with patient to assess and evaluate symptoms and progress in treatment Patient's case to be discussed in multi-disciplinary team meeting Observation Level : q15 minute checks Vital signs: q12 hours Precautions: Safety   Discharge Planning: Social work and case management to assist with discharge planning and identification of hospital follow-up needs prior to discharge Estimated LOS: 5-7 days Discharge Concerns: Need to establish a safety plan; Medication compliance and effectiveness Discharge Goals: Return home with outpatient referrals for mental health follow-up including medication management/psychotherapy  I personally spent a total of 35 minutes in the care of the patient today including preparing to see the patient, counseling and educating, documenting clinical information in the EHR, and coordinating care.   Blair Chiquita Hint, NP,  03/13/2024, 6:49 PM Patient ID: [name] [name], male   DOB: 1991-06-25, 33 y.o.   MRN: [id number] Patient ID: [name] [name], male   DOB: November 21, 1990, 33 y.o.   MRN: [id number] Patient ID: [name] [name], male   DOB: [birth year], 33 y.o.   MRN: [id number]

## 2024-03-13 NOTE — Plan of Care (Signed)
   Problem: Education: Goal: Emotional status will improve Outcome: Progressing Goal: Mental status will improve Outcome: Progressing Goal: Verbalization of understanding the information provided will improve Outcome: Progressing

## 2024-03-13 NOTE — Progress Notes (Signed)
   03/13/24 1104  Psych Admission Type (Psych Patients Only)  Admission Status Involuntary  Psychosocial Assessment  Patient Complaints Anxiety  Eye Contact Fair  Facial Expression Flat  Affect Preoccupied  Speech Logical/coherent  Interaction Assertive  Motor Activity Slow  Appearance/Hygiene In scrubs  Behavior Characteristics Anxious  Mood Preoccupied  Thought Process  Coherency Blocking  Content WDL  Delusions None reported or observed  Perception Hallucinations  Hallucination Auditory  Judgment Impaired  Confusion None  Danger to Self  Current suicidal ideation? Denies  Danger to Others  Danger to Others None reported or observed

## 2024-03-13 NOTE — Group Note (Signed)
 LCSW Group Therapy Note   Group Date: 03/13/2024 Start Time: 1100 End Time: 1200   Participation:  patient was present and actively participated in the conversation.  Type of Therapy:  Group Therapy  Topic:  Healing From Within: Understanding Our Past, Building Our Future"  Objective:  To help participants understand the impact of early experiences on mental and physical health, with a focus on Adverse Childhood Experiences (ACEs), and to explore ways to build resilience and healing.  Group Goals: Understand ACEs and Their Impact: Learn how childhood experiences shape mental and physical health. Build Resilience: Develop strategies for overcoming challenges and creating positive change. Promote Healing: Recognize the value of support and the possibility of healing through therapy and self-care.  Summary: In today's session, we discussed how early experiences, especially ACEs, impact mental and physical health. We explored the effects of stress, abuse, and neglect on brain development and well-being. The group focused on resilience, understanding that healing and positive change are possible with support and self-awareness.  Therapeutic Modalities Used: Psychoeducation: Sharing information about ACEs and their effects. Cognitive Behavioral Therapy (CBT): Helping reframe negative thought patterns. Trauma-Informed Therapy: Creating a safe, supportive space for healing.    [name] [name] O [name] [name], LCSWA 03/13/2024  4:59 PM

## 2024-03-14 LAB — HIV ANTIBODY (ROUTINE TESTING W REFLEX): HIV Screen 4th Generation wRfx: NONREACTIVE

## 2024-03-14 LAB — VITAMIN D 25 HYDROXY (VIT D DEFICIENCY, FRACTURES): Vit D, 25-Hydroxy: 18.42 ng/mL — ABNORMAL LOW (ref 30–100)

## 2024-03-14 LAB — RPR: RPR Ser Ql: NONREACTIVE

## 2024-03-14 MED ORDER — PROPRANOLOL HCL 10 MG PO TABS
10.0000 mg | ORAL_TABLET | Freq: Three times a day (TID) | ORAL | Status: DC
  Administered 2024-03-14 – 2024-03-15 (×3): 10 mg via ORAL
  Filled 2024-03-14 (×3): qty 1

## 2024-03-14 MED ORDER — VITAMIN D (ERGOCALCIFEROL) 1.25 MG (50000 UNIT) PO CAPS
50000.0000 [IU] | ORAL_CAPSULE | Freq: Every day | ORAL | Status: DC
  Administered 2024-03-14 – 2024-03-15 (×2): 50000 [IU] via ORAL
  Filled 2024-03-14 (×2): qty 1

## 2024-03-14 NOTE — Group Note (Signed)
 Recreation Therapy Group Note   Group Topic:Animal Assisted Therapy   Group Date: 03/14/2024 Start Time: 9053 End Time: 1030 Facilitators: Iyani Dresner-McCall, LRT,CTRS Location: [address] Dayroom   Animal-Assisted Activity (AAA) Program Checklist/Progress Notes Patient Eligibility Criteria Checklist & Daily Group note for Rec Tx Intervention  AAA/T Program Assumption of Risk Form signed by Patient/ or Parent Legal Guardian Yes  Patient is free of allergies or severe asthma Yes  Patient reports no fear of animals Yes  Patient reports no history of cruelty to animals Yes  Patient understands his/her participation is voluntary Yes  Patient washes hands before animal contact Yes  Patient washes hands after animal contact Yes  Behavioral Response:    Education: Hand Washing, Appropriate Animal Interaction   Education Outcome: Acknowledges education.    Affect/Mood: N/A   Participation Level: Did not attend    Clinical Observations/Individualized Feedback: Pt given the option to attend pet therapy but declined.    Plan: Continue to engage patient in RT group sessions 2-3x/week.   Sweetie Giebler-McCall, LRT,CTRS  03/14/2024 1:48 PM

## 2024-03-14 NOTE — Group Note (Signed)
 Date:  03/14/2024 Time:  6:26 PM  Group Topic/Focus:  Goals Group:   The focus of this group is to help patients establish daily goals to achieve during treatment and discuss how the patient can incorporate goal setting into their daily lives to aide in recovery. Orientation:   The focus of this group is to educate the patient on the purpose and policies of crisis stabilization and provide a format to answer questions about their admission.  The group details unit policies and expectations of patients while admitted.    Participation Level:  Did Not Attend   [name] [name] Molly 03/14/2024, 6:26 PM

## 2024-03-14 NOTE — [facility] Suicide Risk Assessment (Signed)
 [facility] INPATIENT:  Family/Significant Other Suicide Prevention Education  Suicide Prevention Education:  Contact Attempts: Lennon Boutwell (brother) [phone], (name of family member/significant other) has been identified by the patient as the family member/significant other with whom the patient will be residing, and identified as the person(s) who will aid the patient in the event of a mental health crisis.  With written consent from the patient, two attempts were made to provide suicide prevention education, prior to and/or following the patient's discharge.  We were unsuccessful in providing suicide prevention education.  A suicide education pamphlet was given to the patient to share with family/significant other.  Date and time of second attempt:03/14/2024 / 2:55 PM  CSW attempted to call brother.  He answered the phone.  CSW asked if he spoke with patient on the phone and how is he doing.  He said, no and the call disconnected.   CSW attempted to call again but there as no response and a voicemail wasn't an option.   Raelene Trew O Gwendolyn Nishi, LCSWA 03/14/2024, 3:06 PM

## 2024-03-14 NOTE — Plan of Care (Signed)
   Problem: Education: Goal: Emotional status will improve Outcome: Progressing Goal: Mental status will improve Outcome: Progressing Goal: Verbalization of understanding the information provided will improve Outcome: Progressing

## 2024-03-14 NOTE — Progress Notes (Signed)
   03/14/24 0856  Psych Admission Type (Psych Patients Only)  Admission Status Involuntary  Psychosocial Assessment  Patient Complaints None  Eye Contact Fair  Facial Expression Flat  Affect Preoccupied  Speech Logical/coherent  Interaction Assertive  Motor Activity Slow  Appearance/Hygiene In scrubs  Behavior Characteristics Cooperative;Appropriate to situation  Mood Preoccupied  Thought Process  Coherency Blocking  Content WDL  Delusions None reported or observed  Perception Hallucinations  Hallucination Auditory  Judgment Impaired  Confusion None  Danger to Self  Current suicidal ideation? Denies  Danger to Others  Danger to Others None reported or observed

## 2024-03-14 NOTE — Progress Notes (Signed)
   03/13/24 2127  Psych Admission Type (Psych Patients Only)  Admission Status Involuntary  Psychosocial Assessment  Patient Complaints None  Eye Contact Fair  Facial Expression Flat  Affect Preoccupied  Speech Logical/coherent  Interaction Minimal  Motor Activity Slow  Appearance/Hygiene In scrubs  Behavior Characteristics Cooperative;Anxious  Mood Pleasant;Preoccupied  Thought Process  Coherency Blocking  Content WDL  Delusions None reported or observed  Perception Hallucinations  Hallucination Auditory  Judgment Impaired  Confusion None  Danger to Self  Current suicidal ideation? Denies  Danger to Others  Danger to Others None reported or observed

## 2024-03-14 NOTE — Plan of Care (Signed)
   Problem: Education: Goal: Emotional status will improve Outcome: Progressing Goal: Mental status will improve Outcome: Progressing Goal: Verbalization of understanding the information provided will improve Outcome: Progressing   Problem: Activity: Goal: Interest or engagement in activities will improve Outcome: Progressing

## 2024-03-14 NOTE — Progress Notes (Addendum)
 Conversation with patient:  CSW asked patient where he could go upon discharge (he will be discharged tomorrow, Wednesday, 6/25).    Patient said that he can't go to his dad's house because he has another family.  He didn't give permission to speak with dad.  Patient said his mom passed away.    Patient said he can go to [facility] upon discharge.  He said he was there before.    CSW gave patient a list of shelter resources.   Sitlaly Gudiel, LCSWA 03/14/2024

## 2024-03-14 NOTE — Progress Notes (Signed)
 Patient ID: [name] [name] [name], male   DOB: 11-27-1990, 33 y.o.   MRN: [id number] [facility] MD Progress Note  03/14/2024 10:55 AM [name] [name]  MRN:  [id number]  Reason for admission: 33 year old AA male with previous hx of mental illness & inpatient psychiatric admission. [name] [name] was a patient in this [facility] in April of 2022. At the time, he was having suicidal thoughts. After stabilization on that admission, he was discharged on medications with referral & appointment for outpatient psychiatric services. Patient is being re-admitted to the [facility] this time from the [facility] with complaint of complaint of worsening psychosis. He was apparently brought to the [facility] by the [employer]. After evaluation at [facility], patient was recommended & transferred to the [facility] for further psychiatric evaluation/treatments. A review of his current mariea reports has shown his UDS was positive for THC.   Daily Note: The chart was reviewed today. The patient was discussed at the multidisciplinary team meeting.  Upon approach, the patient was observed sitting up on side of his bed. On assessment today, he reported that his mood is too good and stated that his mood has improved since admission and remained stable.  He denies feeling down, depressed, or sad.  Anxiety symptoms are reported to be at a manageable level.  He reports stable sleep, adequate energy, and no concerns with concentration.  He denies any suicidal thoughts, intent, or plan, and denies homicidal ideation or psychotic symptoms.  The patient also denies experiencing any side effects from his current psychiatric medications.  He shared that his goal for the day is to stay relaxed and try not to worry too much in order to avoid anxiety.  He reports he has not not been able to get in contact with his brother, with whom he resides.  On chart review, social work made an effort to contact the brother; the brother briefly answered the phone but stated he had not spoken with the patient and  then disconnected the call.  The patient declined consent to contact his father, stating that his father has another family, and noted that his mother is deceased.  He stated that he has previously stayed at the [facility] and is willing to return there upon discharge.  Objectively, the patient appeared mildly anxious during the encounter, noted to be slightly rocking back and forth and swinging his legs.  Despite this, he was cooperative, alert, and oriented.  Speech was coherent, and thought processes were linear and goal directed.  No signs of acute distress, psychosis, or mania were observed  The patient remains psychiatrically stable with no suicidal or homicidal ideation, intent, or plan.  Mood is improved since admission, and anxiety is present but manageable.  Mild restlessness and anxiety noted during assessment.  No psychotic symptoms or medication side effects reported.  Ongoing psychosocial stressors include limited family support and difficulty establishing contact with his brother.  Patient has insight into his condition and is receptive to planning for discharge.  He has received his Abilify  Maintena long-acting injections.  Plan is to initiate propranolol  10 mg 3 times daily for anxiety and restlessness.  Continue current psychiatric medication regimen otherwise.  Coordinate with Social Work to explore [facility] discharge placement and assess whether an ACTT referral is appropriate.  Anticipated discharge date is March 15, 2024, pending continued stability.    Principal Problem: Undifferentiated schizophrenia (HCC)  Diagnosis: Principal Problem:   Undifferentiated schizophrenia (HCC)  Total Time spent with patient: 45 minutes   Past Psychiatric  History: See H&P.  Past Medical History:  Past Medical History:  Diagnosis Date   Low testosterone     Schizophrenia (HCC)    Tibial plateau fracture, left     Past Surgical History:  Procedure Laterality Date   APPENDECTOMY     LAPAROSCOPIC  APPENDECTOMY N/A 07/16/2017   Procedure: APPENDECTOMY LAPAROSCOPIC;  Surgeon: Rubin Calamity, MD;  Location: [facility] OR;  Service: General;  Laterality: N/A;   LAPAROTOMY N/A 07/16/2017   Procedure: POSSIBLE OPEN  LAPAROTOMY;  Surgeon: Rubin Calamity, MD;  Location: [facility] OR;  Service: General;  Laterality: N/A;   NO PAST SURGERIES     ORIF TIBIA PLATEAU Left 04/15/2018   Procedure: OPEN REDUCTION INTERNAL FIXATION (ORIF) LEFT BICONDYLAR TIBIAL PLATEAU;  Surgeon: Jerri Kay HERO, MD;  Location: [facility] OR;  Service: Orthopedics;  Laterality: Left;   Family History:  Family History  Problem Relation Age of Onset   Other Mother    Throat cancer Mother    Other Brother    Family Psychiatric  History: See H&P.  Social History:  Social History   Substance and Sexual Activity  Alcohol Use Not Currently     Social History   Substance and Sexual Activity  Drug Use Not Currently   Types: Marijuana   Comment: denies use of marijuana     Social History   Socioeconomic History   Marital status: Single    Spouse name: Not on file   Number of children: Not on file   Years of education: Not on file   Highest education level: Not on file  Occupational History   Not on file  Tobacco Use   Smoking status: Some Days    Current packs/day: 0.25    Average packs/day: 0.3 packs/day for 12.0 years (3.0 ttl pk-yrs)    Types: Cigarettes   Smokeless tobacco: Never  Vaping Use   Vaping status: Never Used  Substance and Sexual Activity   Alcohol use: Not Currently   Drug use: Not Currently    Types: Marijuana    Comment: denies use of marijuana    Sexual activity: Not Currently  Other Topics Concern   Not on file  Social History Narrative   Not on file   Social Drivers of Health   Financial Resource Strain: Not on file  Food Insecurity: No Food Insecurity (03/07/2024)   Hunger Vital Sign    Worried About Running Out of Food in the Last Year: Never true    Ran Out of Food in the Last Year:  Never true  Transportation Needs: Unmet Transportation Needs (03/07/2024)   PRAPARE - Administrator, Civil Service (Medical): Yes    Lack of Transportation (Non-Medical): Yes  Physical Activity: Not on file  Stress: Not on file  Social Connections: Not on file   Additional Social History:    Sleep: Good Estimated Sleeping Duration (Last 24 Hours): 5.25-7.00 hours  Appetite:  Good  Current Medications: Current Facility-Administered Medications  Medication Dose Route Frequency Provider Last Rate Last Admin   acetaminophen  (TYLENOL ) tablet 650 mg  650 mg Oral Q6H PRN Arloa Suzen RAMAN, NP       alum & mag hydroxide-simeth (MAALOX/MYLANTA) 200-200-20 MG/5ML suspension 30 mL  30 mL Oral Q4H PRN Arloa Suzen RAMAN, NP   30 mL at 03/11/24 1806   ARIPiprazole  (ABILIFY ) tablet 20 mg  20 mg Oral Daily Izediuno, Vincent A, MD   20 mg at 03/14/24 0828   ARIPiprazole  ER (ABILIFY  MAINTENA) injection 400  mg  400 mg Intramuscular Q28 days Izediuno, Vincent A, MD   400 mg at 03/11/24 9063   haloperidol  (HALDOL ) tablet 5 mg  5 mg Oral TID PRN Arloa Suzen RAMAN, NP       And   diphenhydrAMINE  (BENADRYL ) capsule 50 mg  50 mg Oral TID PRN Arloa Suzen RAMAN, NP       haloperidol  lactate (HALDOL ) injection 5 mg  5 mg Intramuscular TID PRN Arloa Suzen RAMAN, NP       And   diphenhydrAMINE  (BENADRYL ) injection 50 mg  50 mg Intramuscular TID PRN Arloa Suzen RAMAN, NP       And   LORazepam  (ATIVAN ) injection 2 mg  2 mg Intramuscular TID PRN Arloa Suzen RAMAN, NP       haloperidol  lactate (HALDOL ) injection 10 mg  10 mg Intramuscular TID PRN Arloa Suzen RAMAN, NP       And   diphenhydrAMINE  (BENADRYL ) injection 50 mg  50 mg Intramuscular TID PRN Arloa Suzen RAMAN, NP       And   LORazepam  (ATIVAN ) injection 2 mg  2 mg Intramuscular TID PRN Arloa Suzen RAMAN, NP       hydrOXYzine  (ATARAX ) tablet 25 mg  25 mg Oral TID PRN Arloa Suzen RAMAN, NP   25 mg at 03/14/24 0055   magnesium   hydroxide (MILK OF MAGNESIA) suspension 30 mL  30 mL Oral Daily PRN Arloa Suzen RAMAN, NP       traZODone  (DESYREL ) tablet 50 mg  50 mg Oral QHS PRN Arloa Suzen RAMAN, NP   50 mg at 03/14/24 0055   Vitamin D  (Ergocalciferol ) (DRISDOL) 1.25 MG (50000 UNIT) capsule 50,000 Units  50,000 Units Oral Daily Kennyth Starleen RAMAN, MD       Lab Results:  Results for orders placed or performed during the hospital encounter of 03/07/24 (from the past 48 hours)  Folate     Status: None   Collection Time: 03/13/24  6:53 PM  Result Value Ref Range   Folate 11.7 >5.9 ng/mL    Comment: Performed at [facility], [address] [place], [place] [address]  RPR     Status: None   Collection Time: 03/13/24  6:53 PM  Result Value Ref Range   RPR Ser Ql NON REACTIVE NON REACTIVE    Comment: Performed at [facility], [address] [place], [place] [address]  Vitamin B12     Status: None   Collection Time: 03/13/24  6:53 PM  Result Value Ref Range   Vitamin B-12 346 180 - 914 pg/mL    Comment: (NOTE) This assay is not validated for testing neonatal or myeloproliferative syndrome specimens for Vitamin B12 levels. Performed at [facility], [address], [place], [place] [address]   VITAMIN D  25 Hydroxy (Vit-D Deficiency, Fractures)     Status: Abnormal   Collection Time: 03/13/24  6:53 PM  Result Value Ref Range   Vit D, 25-Hydroxy 18.42 (L) 30 - 100 ng/mL    Comment: (NOTE) Vitamin D  deficiency has been defined by the Institute of Medicine  and an Endocrine Society practice guideline as a level of serum 25-OH  vitamin D  less than 20 ng/mL (1,2). The Endocrine Society went on to  further define vitamin D  insufficiency as a level between 21 and 29  ng/mL (2).  1. IOM (Institute of Medicine). 2010. Dietary reference intakes for  calcium  and D. [place]: [employer]. 2.  Holick MF, Binkley [place], Bischoff-Ferrari HA, et al. Evaluation,   treatment, and prevention of vitamin D  deficiency: an Endocrine  Society clinical practice guideline, JCEM. 2011 Jul; 96(7): 1911-30.  Performed at [facility], [address], [place], [place] [address]   HIV Antibody (routine testing w rflx)     Status: None   Collection Time: 03/13/24  6:53 PM  Result Value Ref Range   HIV Screen 4th Generation wRfx Non Reactive Non Reactive    Comment: Performed at [facility], [address] [place], [place] [address]    Blood Alcohol level:  Lab Results  Component Value Date   [facility] <15 03/06/2024   ETH <10 02/15/2021   Metabolic Disorder Labs: Lab Results  Component Value Date   HGBA1C 5.3 03/06/2024   MPG 105.41 03/06/2024   MPG 114.02 01/08/2021   Lab Results  Component Value Date   PROLACTIN 5.6 01/14/2015   Lab Results  Component Value Date   CHOL 182 03/06/2024   TRIG 67 03/06/2024   HDL 37 (L) 03/06/2024   CHOLHDL 4.9 03/06/2024   VLDL 13 03/06/2024   LDLCALC 132 (H) 03/06/2024   LDLCALC 109 (H) 01/08/2021   Physical Findings: AIMS:  ,  ,0  ,  ,  ,  ,   CIWA:   N/A COWS:   N/A  Musculoskeletal: Strength & Muscle Tone: within normal limits Gait & Station: normal Patient leans: N/A  Psychiatric Specialty Exam:  Presentation  General Appearance:  Casual; Fairly Groomed  Eye Contact: Fair  Speech: Clear and Coherent  Speech Volume: Normal  Handedness: Right   Mood and Affect  Mood: -- (Doing good.)  Affect: Blunt  Thought Process  Thought Processes: Goal Directed; Linear  Descriptions of Associations:Intact  Orientation:Full (Time, Place and Person)  Thought Content:Logical  History of Schizophrenia/Schizoaffective disorder:Yes  Duration of Psychotic Symptoms:Greater than six months  Hallucinations:Hallucinations: None   Ideas of Reference:None  Suicidal Thoughts:Suicidal Thoughts: No   Homicidal Thoughts:Homicidal Thoughts: No    Sensorium   Memory: Immediate Good; Recent Good  Judgment: Fair  Insight: Fair   Art therapist  Concentration: Fair  Attention Span: Fair  Recall: Good  Fund of Knowledge: Fair  Language: Fair  Psychomotor Activity  Psychomotor Activity: Psychomotor Activity: Normal   Assets  Assets: Desire for Improvement; Physical Health; Resilience  Sleep  Sleep: Sleep: Fair   Physical Exam: Physical Exam Vitals and nursing note reviewed.  HENT:     Head: Normocephalic.     Nose: Nose normal.   Cardiovascular:     Rate and Rhythm: Normal rate.     Pulses: Normal pulses.  Pulmonary:     Effort: Pulmonary effort is normal.  Genitourinary:    Comments: Deferred  Musculoskeletal:        General: Normal range of motion.     Cervical back: Normal range of motion.   Neurological:     General: No focal deficit present.     Mental Status: He is oriented to person, place, and time.    Review of Systems  Constitutional:  Negative for chills, diaphoresis and fever.  HENT:  Negative for congestion and sore throat.   Respiratory:  Negative for cough, shortness of breath and wheezing.   Cardiovascular:  Negative for chest pain and palpitations.  Gastrointestinal:  Negative for abdominal pain, constipation, diarrhea, heartburn, nausea and vomiting.  Genitourinary:  Negative for dysuria.  Musculoskeletal:  Negative for joint pain and myalgias.  Skin:  Negative for itching and rash.  Neurological:  Negative for dizziness, tingling, tremors, sensory change, speech change, focal weakness, seizures, loss of consciousness, weakness and headaches.  Endo/Heme/Allergies:        NKDA  Psychiatric/Behavioral:  Positive for substance abuse.    Blood pressure (!) 140/89, pulse (!) 115, temperature 98.5 F (36.9 C), temperature source Oral, resp. rate 16, height 5' 4 (1.626 m), weight 99.1 kg, SpO2 100%. Body mass index is 37.49 kg/m.  Treatment Plan Summary: Daily contact with  patient to assess and evaluate symptoms and progress in treatment and Medication management.   Principal/active diagnoses.  Undifferentiated schizophrenia (HCC).  Cannabis use disorder.  Plan: The risks/benefits/side-effects/alternatives to the medications in use were discussed in detail with the patient and time was given for patient's questions. The patient consents to medication trial.    -Start propanolol 10 mg TID for anxiety symptoms -Continue Abilify  from 20 mg po daily for mood control.  -Continue hydroxyzine  25 mg po TID PRN for anxiety.  -Continue Trazodone  50 mg po Q bedtime prn for insomnia.  -Initiated Abilify  Maintena 400 mg IM Q 28 days (first dose on March 11, 2024), for mood control   Agitation protocols.  -Continue as recommended (See MAR).   Other PRNS -Continue Tylenol  650 mg every 6 hours PRN for mild pain -Continue Maalox 30 ml Q 4 hrs PRN for indigestion -Continue MOM 30 ml po Q 6 hrs for constipation   Safety and Monitoring: Voluntary admission to inpatient psychiatric unit for safety, stabilization and treatment Daily contact with patient to assess and evaluate symptoms and progress in treatment Patient's case to be discussed in multi-disciplinary team meeting Observation Level : q15 minute checks Vital signs: q12 hours Precautions: Safety   Discharge Planning: Social work and case management to assist with discharge planning and identification of hospital follow-up needs prior to discharge Estimated LOS: 5-7 days Discharge Concerns: Need to establish a safety plan; Medication compliance and effectiveness Discharge Goals: Return home with outpatient referrals for mental health follow-up including medication management/psychotherapy  I personally spent a total of 45 minutes in the care of the patient today including preparing to see the patient, counseling and educating, documenting clinical information in the EHR, and coordinating care.   Blair Chiquita Hint, NP,  03/14/2024, 10:55 AM Patient ID: Darold CHRISTELLA Horner, male   DOB: 09/12/1991, 33 y.o.   MRN: [id number] Patient ID: MAXAMILLION BANAS, male   DOB: 1991/04/06, 33 y.o.   MRN: [id number] Patient ID: ANSELMO REIHL, male   DOB: [birth year], 33 y.o.   MRN: [id number]

## 2024-03-15 ENCOUNTER — Other Ambulatory Visit (HOSPITAL_COMMUNITY): Payer: Self-pay

## 2024-03-15 ENCOUNTER — Encounter (HOSPITAL_COMMUNITY): Payer: Self-pay

## 2024-03-15 ENCOUNTER — Other Ambulatory Visit: Payer: Self-pay

## 2024-03-15 MED ORDER — PROPRANOLOL HCL 10 MG PO TABS
10.0000 mg | ORAL_TABLET | Freq: Three times a day (TID) | ORAL | 0 refills | Status: AC
  Filled 2024-03-15: qty 90, 30d supply, fill #0

## 2024-03-15 MED ORDER — TRAZODONE HCL 50 MG PO TABS
50.0000 mg | ORAL_TABLET | Freq: Every evening | ORAL | 0 refills | Status: AC | PRN
  Filled 2024-03-15: qty 30, 30d supply, fill #0

## 2024-03-15 MED ORDER — VITAMIN D 25 MCG (1000 UNIT) PO TABS: 1000.0000 [IU] | ORAL_TABLET | Freq: Every day | ORAL | Status: DC

## 2024-03-15 MED ORDER — ARIPIPRAZOLE ER 400 MG IM SRER
400.0000 mg | INTRAMUSCULAR | 0 refills | Status: AC
  Filled 2024-03-15 (×2): qty 1, 28d supply, fill #0

## 2024-03-15 MED ORDER — HYDROXYZINE HCL 25 MG PO TABS
25.0000 mg | ORAL_TABLET | Freq: Three times a day (TID) | ORAL | 0 refills | Status: AC | PRN
  Filled 2024-03-15: qty 30, 10d supply, fill #0

## 2024-03-15 MED ORDER — VITAMIN D3 25 MCG PO TABS: 1000.0000 [IU] | ORAL_TABLET | Freq: Every day | ORAL | Status: AC

## 2024-03-15 MED ORDER — ARIPIPRAZOLE 20 MG PO TABS
20.0000 mg | ORAL_TABLET | Freq: Every day | ORAL | 0 refills | Status: AC
  Filled 2024-03-15: qty 10, 10d supply, fill #0

## 2024-03-15 NOTE — [facility] Suicide Risk Assessment (Addendum)
 [facility] INPATIENT:  Family/Significant Other Suicide Prevention Education  Suicide Prevention Education:  Contact Attempts: [name] [name] (brother) [phone], (name of family member/significant other) has been identified by the patient as the family member/significant other with whom the patient will be residing, and identified as the person(s) who will aid the patient in the event of a mental health crisis.  With written consent from the patient, two attempts were made to provide suicide prevention education, prior to and/or following the patient's discharge.  We were unsuccessful in providing suicide prevention education.  A suicide education pamphlet was given to the patient to share with family/significant other.  Date and time of first attempt: 03/15/2024 / 9:08 AM  The phone rang but there was no response.  The message said, The mailbox is full and can't accept messages at this time.   [name] O Algis Lehenbauer, LCSWA 03/15/2024, 9:07 AM

## 2024-03-15 NOTE — Group Note (Signed)
 Date:  03/15/2024 Time:  2:25 AM  Group Topic/Focus:  Wrap-Up Group:   The focus of this group is to help patients review their daily goal of treatment and discuss progress on daily workbooks.    Participation Level:  Did Not Attend  Additional Comments:    Kristen VEAR Gibbon 03/15/2024, 2:25 AM

## 2024-03-15 NOTE — [facility] Suicide Risk Assessment (Signed)
 [facility] INPATIENT:  Family/Significant Other Suicide Prevention Education  Suicide Prevention Education:  Education Completed; with patient,  (name of family member/significant other) has been identified by the patient as the family member/significant other with whom the patient will be residing, and identified as the person(s) who will aid the patient in the event of a mental health crisis (suicidal ideations/suicide attempt).  With written consent from the patient, the family member/significant other has been provided the following suicide prevention education, prior to the and/or following the discharge of the patient.  Patient was given Suicide Prevention Information brochure.  Patient said he doesn't have any guns or weapons.    The suicide prevention education provided includes the following: Suicide risk factors Suicide prevention and interventions National Suicide Hotline telephone number [facility] assessment telephone number [facility] Emergency Assistance 911 [facility] and/or Residential Mobile Crisis Unit telephone number  Request made of family/significant other to: Remove weapons (e.g., guns, rifles, knives), all items previously/currently identified as safety concern.   Remove drugs/medications (over-the-counter, prescriptions, illicit drugs), all items previously/currently identified as a safety concern.  The family member/significant other verbalizes understanding of the suicide prevention education information provided.  The family member/significant other agrees to remove the items of safety concern listed above.  Philemon Riedesel O Joriel Streety, LCSWA 03/15/2024, 9:47 AM

## 2024-03-15 NOTE — Discharge Summary (Addendum)
 Physician Discharge Summary Note  Patient:  [name] [name] is an 33 y.o., male MRN:  [id number] DOB:  01-25-1991 Patient phone:  [phone] (home)  Patient address:   [address] [place] [address],  Total Time spent with patient: 30 minutes  Date of Admission:  03/07/2024 Date of Discharge: 03/15/24   Reason for Admission:  33 year old AA male with previous hx of mental illness & inpatient psychiatric admission. [name] was a patient in this [facility] in April of 2022. At the time, he was having suicidal thoughts. After stabilization on that admission, he was discharged on medications with referral & appointment for outpatient psychiatric services. Patient is being re-admitted to the [facility] this time from the [facility] with complaint of complaint of worsening psychosis. He was apparently brought to the [facility] by the [employer]. After evaluation at [facility], patient was recommended & transferred to the [facility] for further psychiatric evaluation/treatments. A review of his current mariea reports has shown his UDS was positive for THC.   Principal Problem: Undifferentiated schizophrenia [facility]) Discharge Diagnoses: Principal Problem:   Undifferentiated schizophrenia (HCC)   Past Psychiatric History: See H&P   Past Medical History:  Past Medical History:  Diagnosis Date   Low testosterone     Schizophrenia (HCC)    Tibial plateau fracture, left     Past Surgical History:  Procedure Laterality Date   APPENDECTOMY     LAPAROSCOPIC APPENDECTOMY N/A 07/16/2017   Procedure: APPENDECTOMY LAPAROSCOPIC;  Surgeon: Rubin Calamity, MD;  Location: [facility] OR;  Service: General;  Laterality: N/A;   LAPAROTOMY N/A 07/16/2017   Procedure: POSSIBLE OPEN  LAPAROTOMY;  Surgeon: Rubin Calamity, MD;  Location: [facility] OR;  Service: General;  Laterality: N/A;   NO PAST SURGERIES     ORIF TIBIA PLATEAU Left 04/15/2018   Procedure: OPEN REDUCTION INTERNAL FIXATION (ORIF) LEFT BICONDYLAR TIBIAL PLATEAU;  Surgeon: Jerri Kay CHRISTELLA, MD;   Location: [facility] OR;  Service: Orthopedics;  Laterality: Left;   Family History:  Family History  Problem Relation Age of Onset   Other Mother    Throat cancer Mother    Other Brother    Family Psychiatric  History: See H&P  Social History:  Social History   Substance and Sexual Activity  Alcohol Use Not Currently     Social History   Substance and Sexual Activity  Drug Use Not Currently   Types: Marijuana   Comment: denies use of marijuana     Social History   Socioeconomic History   Marital status: Single    Spouse name: Not on file   Number of children: Not on file   Years of education: Not on file   Highest education level: Not on file  Occupational History   Not on file  Tobacco Use   Smoking status: Some Days    Current packs/day: 0.25    Average packs/day: 0.3 packs/day for 12.0 years (3.0 ttl pk-yrs)    Types: Cigarettes   Smokeless tobacco: Never  Vaping Use   Vaping status: Never Used  Substance and Sexual Activity   Alcohol use: Not Currently   Drug use: Not Currently    Types: Marijuana    Comment: denies use of marijuana    Sexual activity: Not Currently  Other Topics Concern   Not on file  Social History Narrative   Not on file   Social Drivers of Health   Financial Resource Strain: Not on file  Food Insecurity: No Food Insecurity (03/07/2024)   Hunger Vital  Sign    Worried About Programme researcher, broadcasting/film/video in the Last Year: Never true    Ran Out of Food in the Last Year: Never true  Transportation Needs: Unmet Transportation Needs (03/07/2024)   PRAPARE - Administrator, Civil Service (Medical): Yes    Lack of Transportation (Non-Medical): Yes  Physical Activity: Not on file  Stress: Not on file  Social Connections: Not on file    Hospital Course:  During the patient's hospitalization, patient had extensive initial psychiatric evaluation, and follow-up psychiatric evaluations every day.  Psychiatric diagnoses provided upon initial  assessment:  Principal Problem:   Undifferentiated schizophrenia (HCC)  Patient's psychiatric medications were adjusted on admission:  Continue Abilify  10 mg daily for mood control.   During the hospitalization, other adjustments were made to the patient's psychiatric medication regimen:  Abilify  dose was optimized from 10 mg daily to 20 mg daily to enhance mood stabilization and symptom control.    Initiated Abilify  Maintena 400 mg LAI Q 28 days on March 11, 2024 (next dose due on April 08, 2024).  On discharge, continue Abilify  20 mg daily x 10 days to complete transition to LAI.  Patient's care was discussed during the interdisciplinary team meeting every day during the hospitalization.  The patient denies having side effects to prescribed psychiatric medication.  Gradually, patient started adjusting to milieu. The patient was evaluated each day by a clinical provider to ascertain response to treatment. Improvement was noted by the patient's report of decreasing symptoms, improved sleep and appetite, affect, medication tolerance, behavior, and participation in unit programming.  Patient was asked each day to complete a self inventory noting mood, mental status, pain, new symptoms, anxiety and concerns.    Symptoms were reported as significantly decreased or resolved completely by discharge.   On day of discharge, the patient reports that their mood is stable. The patient denied having suicidal thoughts for more than 48 hours prior to discharge.  Patient denies having homicidal thoughts.  Patient denies having auditory hallucinations.  Patient denies any visual hallucinations or other symptoms of psychosis. The patient was motivated to continue taking medication with a goal of continued improvement in mental health.   The patient reports their target psychiatric symptoms of anxiety, insomnia, and psychosis responded well to the psychiatric medications, and the patient reports overall benefit  other psychiatric hospitalization. Supportive psychotherapy was provided to the patient. The patient also participated in regular group therapy while hospitalized. Coping skills, problem solving as well as relaxation therapies were also part of the unit programming.  Labs were reviewed with the patient, and abnormal results were discussed with the patient.  The patient is able to verbalize their individual safety plan to this provider.  # It is recommended to the patient to continue psychiatric medications as prescribed, after discharge from the hospital.    # It is recommended to the patient to follow up with your outpatient psychiatric provider and PCP.  # It was discussed with the patient, the impact of alcohol, drugs, tobacco have been there overall psychiatric and medical wellbeing, and total abstinence from substance use was recommended the patient.ed.  # Prescriptions provided or sent directly to preferred pharmacy at discharge. Patient agreeable to plan. Given opportunity to ask questions. Appears to feel comfortable with discharge.    # In the event of worsening symptoms, the patient is instructed to call the crisis hotline, 911 and or go to the nearest ED for appropriate evaluation and treatment of  symptoms. To follow-up with primary care provider for other medical issues, concerns and or health care needs  # Patient was discharged to [facility] with a plan to follow up as noted below.   Physical Findings: AIMS:  , ,  0,  ,  ,  ,   CIWA:   N/A COWS:   N/A  Musculoskeletal: Strength & Muscle Tone: within normal limits Gait & Station: normal Patient leans: N/A   Psychiatric Specialty Exam:  Presentation  General Appearance:  Casual  Eye Contact: Good  Speech: Clear and Coherent  Speech Volume: Decreased  Handedness: Right   Mood and Affect  Mood: Euthymic  Affect: Congruent   Thought Process  Thought Processes: Goal Directed; Linear  Descriptions of  Associations:Intact  Orientation:Full (Time, Place and Person)  Thought Content:Logical  History of Schizophrenia/Schizoaffective disorder:Yes  Duration of Psychotic Symptoms:Greater than six months  Hallucinations:Hallucinations: None  Ideas of Reference:None  Suicidal Thoughts:Suicidal Thoughts: No  Homicidal Thoughts:Homicidal Thoughts: No   Sensorium  Memory: Immediate Good; Recent Good; Remote Good  Judgment: Good  Insight: Good   Executive Functions  Concentration: Good  Attention Span: Good  Recall: Good  Fund of Knowledge: Good  Language: Good   Psychomotor Activity  Psychomotor Activity:Psychomotor Activity: Normal   Assets  Assets: Desire for Improvement; Physical Health; Resilience   Sleep  Sleep:Sleep: Good  Estimated Sleeping Duration (Last 24 Hours): 5.00-5.25 hours   Physical Exam: Physical Exam Vitals and nursing note reviewed.  Constitutional:      General: He is not in acute distress.    Appearance: He is obese.  HENT:     Mouth/Throat:     Pharynx: Oropharynx is clear.   Cardiovascular:     Pulses: Normal pulses.  Pulmonary:     Effort: No respiratory distress.   Musculoskeletal:        General: Normal range of motion.     Cervical back: Normal range of motion.   Skin:    General: Skin is dry.   Neurological:     General: No focal deficit present.     Mental Status: He is alert and oriented to person, place, and time. Mental status is at baseline.   Psychiatric:        Mood and Affect: Mood normal.        Behavior: Behavior normal.        Thought Content: Thought content normal.        Judgment: Judgment normal.    Review of Systems  Constitutional: Negative.   HENT: Negative.    Respiratory: Negative.    Cardiovascular: Negative.   Gastrointestinal: Negative.   Skin: Negative.   Neurological: Negative.   Psychiatric/Behavioral:  Positive for substance abuse. Negative for depression,  hallucinations, memory loss and suicidal ideas. The patient is nervous/anxious (Stable for outpatient management) and has insomnia (Stable for outpatient management).    Blood pressure (!) 105/94, pulse 91, temperature 97.6 F (36.4 C), temperature source Oral, resp. rate 16, height 5' 4 (1.626 m), weight 99.1 kg, SpO2 100%. Body mass index is 37.49 kg/m.   Social History   Tobacco Use  Smoking Status Some Days   Current packs/day: 0.25   Average packs/day: 0.3 packs/day for 12.0 years (3.0 ttl pk-yrs)   Types: Cigarettes  Smokeless Tobacco Never   Tobacco Cessation:  N/A, patient does not currently use tobacco products   Blood Alcohol level:  Lab Results  Component Value Date   [facility] <15 03/06/2024   ETH <10  02/15/2021    Metabolic Disorder Labs:  Lab Results  Component Value Date   HGBA1C 5.3 03/06/2024   MPG 105.41 03/06/2024   MPG 114.02 01/08/2021   Lab Results  Component Value Date   PROLACTIN 5.6 01/14/2015   Lab Results  Component Value Date   CHOL 182 03/06/2024   TRIG 67 03/06/2024   HDL 37 (L) 03/06/2024   CHOLHDL 4.9 03/06/2024   VLDL 13 03/06/2024   LDLCALC 132 (H) 03/06/2024   LDLCALC 109 (H) 01/08/2021    See Psychiatric Specialty Exam and Suicide Risk Assessment completed by Attending Physician prior to discharge.  Discharge destination:  Other:  IRC   Is patient on multiple antipsychotic therapies at discharge:  No   Has Patient had three or more failed trials of antipsychotic monotherapy by history:  No  Recommended Plan for Multiple Antipsychotic Therapies: NA  Discharge Instructions     Activity as tolerated - No restrictions   Complete by: As directed    Diet - low sodium heart healthy   Complete by: As directed       Allergies as of 03/15/2024   No Known Allergies      Medication List     TAKE these medications      Indication  ARIPiprazole  20 MG tablet Commonly known as: ABILIFY  Take 1 tablet (20 mg total) by mouth  daily for 10 days. Start taking on: March 16, 2024  Indication: Schizophrenia   ARIPiprazole  ER 400 MG Srer injection Commonly known as: ABILIFY  MAINTENA Inject 2 mLs (400 mg total) into the muscle every 28 (twenty-eight) days. Start taking on: April 08, 2024  Indication: Schizophrenia   hydrOXYzine  25 MG tablet Commonly known as: ATARAX  Take 1 tablet (25 mg total) by mouth 3 (three) times daily as needed for anxiety.  Indication: Feeling Anxious   propranolol  10 MG tablet Commonly known as: INDERAL  Take 1 tablet (10 mg total) by mouth 3 (three) times daily.  Indication: Feeling Anxious, Schizophrenia   traZODone  50 MG tablet Commonly known as: DESYREL  Take 1 tablet (50 mg total) by mouth at bedtime as needed for sleep.  Indication: Trouble Sleeping   vitamin D3 25 MCG tablet Commonly known as: CHOLECALCIFEROL Take 1 tablet (1,000 Units total) by mouth daily. Start taking on: March 16, 2024  Indication: Vitamin D  Deficiency        Follow-up Information     [facility] Follow up.   Why: CST services. Please follow up at discharge to schedule an intake appointment. Contact information: [address] [place], [place] [address]   Telephone: [phone] Fax: [phone]        [facility] Go on 03/16/2024.   Specialty: Professional Counselor Why: Please go to this provider on 03/16/24 at 9:00 am for an assessment, to obtain therapy services.  You may also go on Monday through Friday, from 9 am to 1 pm. Contact information: [address]  [address] [place] [address] [phone]         [facility] Go on 03/30/2024.   Why: You have an appointment for medication management services on 03/30/24 at 10:00 am . The appointment will be held in person, but you may call to switch to Virtual. Contact information: [address] [place] [address] [phone]         [facility] Follow up.   Why: A  referral was sent on 03/14/2024. Please contact the provider on 03/16/2024  at 9 AM for an update on the status of the referral. Contact information: Doctor, hospital ([facility]) [address] [place], [place] [address] [phone]                Follow-up recommendations:   Activity: as tolerated  Diet: heart healthy  Other: -Follow-up with your outpatient psychiatric provider -instructions on appointment date, time, and address (location) are provided to you in discharge paperwork.  -Take your psychiatric medications as prescribed at discharge - instructions are provided to you in the discharge paperwork  -Follow-up with outpatient primary care doctor and other specialists -for management of preventative medicine and chronic medical disease:  Vitamin D  deficiency   -Testing: Follow-up with outpatient provider for abnormal lab results:  Low vitamin D   elevated HDL and LDL cholesterol  -If you are prescribed an atypical antipsychotic medication, we recommend that your outpatient psychiatrist follow routine screening for side effects within 3 months of discharge, including monitoring: AIMS scale, height, weight, blood pressure, fasting lipid panel, HbA1c, and fasting blood sugar.   -Recommend total abstinence from alcohol, tobacco, and other illicit drug use at discharge.   -If your psychiatric symptoms recur, worsen, or if you have side effects to your psychiatric medications, call your outpatient psychiatric provider, 911, [phone] or go to the nearest emergency department.  -If suicidal thoughts occur, immediately call your outpatient psychiatric provider, 911, [phone] or go to the nearest emergency department.     Signed: Blair Chiquita Hint, NP 03/15/2024, 10:09 AM

## 2024-03-15 NOTE — Plan of Care (Signed)

## 2024-03-15 NOTE — [facility] Suicide Risk Assessment (Signed)
 Suicide Risk Assessment  Discharge Assessment    [facility] Discharge Suicide Risk Assessment   Principal Problem: Undifferentiated schizophrenia (HCC) Discharge Diagnoses: Principal Problem:   Undifferentiated schizophrenia (HCC)   Total Time spent with patient: 30 minutes  Musculoskeletal: Strength & Muscle Tone: within normal limits Gait & Station: normal Patient leans: N/A  Psychiatric Specialty Exam  Presentation  General Appearance:  Casual  Eye Contact: Good  Speech: Clear and Coherent  Speech Volume: Decreased  Handedness: Right   Mood and Affect  Mood: Euthymic  Duration of Depression Symptoms: No data recorded Affect: Congruent   Thought Process  Thought Processes: Goal Directed; Linear  Descriptions of Associations:Intact  Orientation:Full (Time, Place and Person)  Thought Content:Logical  History of Schizophrenia/Schizoaffective disorder:Yes  Duration of Psychotic Symptoms:Greater than six months  Hallucinations:Hallucinations: None  Ideas of Reference:None  Suicidal Thoughts:Suicidal Thoughts: No  Homicidal Thoughts:Homicidal Thoughts: No   Sensorium  Memory: Immediate Good; Recent Good; Remote Good  Judgment: Good  Insight: Good   Executive Functions  Concentration: Good  Attention Span: Good  Recall: Good  Fund of Knowledge: Good  Language: Good   Psychomotor Activity  Psychomotor Activity:Psychomotor Activity: Normal   Assets  Assets: Desire for Improvement; Physical Health; Resilience   Sleep  Sleep:Sleep: Good  Estimated Sleeping Duration (Last 24 Hours): 5.00-5.25 hours  Physical Exam: Physical Exam ROS Blood pressure (!) 105/94, pulse 91, temperature 97.6 F (36.4 C), temperature source Oral, resp. rate 16, height 5' 4 (1.626 m), weight 99.1 kg, SpO2 100%. Body mass index is 37.49 kg/m.  Mental Status Per Nursing Assessment::   On Admission:  NA  Demographic Factors:  Low  socioeconomic status  Loss Factors: Financial problems/change in socioeconomic status  Historical Factors: Impulsivity  Risk Reduction Factors:   Religious beliefs about death and Positive coping skills or problem solving skills  Continued Clinical Symptoms:  Schizophrenia:   Paranoid or undifferentiated type More than one psychiatric diagnosis Previous Psychiatric Diagnoses and Treatments  Cognitive Features That Contribute To Risk:  None    Suicide Risk:  Minimal: No identifiable suicidal ideation.  Patients presenting with no risk factors but with morbid ruminations; may be classified as minimal risk based on the severity of the depressive symptoms   Follow-up Information     [facility] Follow up.   Why: CST services. Please follow up at discharge to schedule an intake appointment. Contact information: [address] [place], [place] [address]   Telephone: [phone] Fax: [phone]        [facility] Go on 03/16/2024.   Specialty: Professional Counselor Why: Please go to this provider on 03/16/24 at 9:00 am for an assessment, to obtain therapy services.  You may also go on Monday through Friday, from 9 am to 1 pm. Contact information: [address]  [address] [place] [address] [phone]         [facility] Go on 03/30/2024.   Why: You have an appointment for medication management services on 03/30/24 at 10:00 am . The appointment will be held in person, but you may call to switch to Virtual. Contact information: [address] [place] [address] [phone]         [facility] Follow up.   Why: A referral was sent on 03/14/2024. Please contact the provider on 03/16/2024 at 9 AM for an update on the status of the referral. Contact information: [facility] [address] [place],  [place] [address] [phone]                Plan Of  Care/Follow-up recommendations:  See discharge summary  Blair Chiquita Hint, NP 03/15/2024, 10:12 AM

## 2024-03-15 NOTE — Progress Notes (Signed)
 Pharmacy Patient Advocate Encounter  Insurance verification completed.   The patient is insured through [employer] [place] [employer]   Ran test claim for [employer]. Co-pay is $4.  This test claim was processed through [facility]- copay amounts may vary at other pharmacies due to pharmacy/plan contracts, or as the patient moves through the different stages of their insurance plan.

## 2024-03-15 NOTE — Progress Notes (Signed)
  [facility] Adult Case Management Discharge Plan :  Will you be returning to the same living situation after discharge:  No, patient is unhoused and will go to [facility] (patient was staying with his brother prior to the hospital stay but can't return there at this time - brother hasn't responded to calls) At discharge, do you have transportation home?: Yes,  CSW arranged transportation through [facility] for 10:30 AM Do you have the ability to pay for your medications: Yes,  patient has insurance  Release of information consent forms completed and in the chart;  Patient's signature needed at discharge.  Patient to Follow up at:  Follow-up Information     [facility] Follow up.   Why: CST services. Please follow up at discharge to schedule an intake appointment. Contact information: [address] [place], [place] [address]   Telephone: [phone] Fax: [phone]        [facility] Go on 03/16/2024.   Specialty: Professional Counselor Why: Please go to this provider on 03/16/24 at 9:00 am for an assessment, to obtain therapy services.  You may also go on Monday through Friday, from 9 am to 1 pm. Contact information: [address] [place]  [address] [place] [address] [phone]         [facility] Go on 03/30/2024.   Why: You have an appointment for medication management services on 03/30/24 at 10:00 am . The appointment will be held in person, but you may call to switch to Virtual. Contact information: [address] [place] [address] [phone]         [facility] Follow up.   Why: A referral was sent on 03/14/2024. Please contact the provider on 03/16/2024 at 9 AM for an update on the status of the referral. Contact information: [employer] ([facility]) [address] [place], [place] [address] [phone]                Next level of care provider has access to [facility]  Link:no  Safety Planning and Suicide Prevention discussed: Yes,  with patient  Has patient been referred to the Quitline?: Patient does not use tobacco/nicotine  products  Patient has been referred for addiction treatment:  At admission, patient tested positive for marijuana.  He said that he will use CBD instead of marijuana and will decrease the use (harm reduction).  CSW encouraged him to discuss substance use in therapy.    Jaylan Hinojosa O Luisantonio Adinolfi, LCSWA 03/15/2024, 9:49 AM

## 2024-03-15 NOTE — Transportation (Signed)
 03/15/2024  [name] [name] DOB: March 03, 1991 MRN: [id number]   RIDER WAIVER AND RELEASE OF LIABILITY  For the purposes of helping with transportation needs, [facility] partners with outside transportation providers (taxi companies, [employer], Cater[employer].) to give [facility] patients or other approved people the choice of on-demand rides Public librarian) to our buildings for non-emergency visits.  By using [employer], I, the person signing this document, on behalf of myself and/or any legal minors (in my care using the [employer]), agree:  Science writer given to me are supplied by independent, outside transportation providers who do not work for, or have any affiliation with, [employer]. [facility] is not a transportation company. Man has no control over the quality or safety of the rides I get using [employer]. [place] [facility] has no control over whether any outside ride will happen on time or not. [facility] gives no guarantee on the reliability, quality, safety, or availability on any rides, or that no mistakes will happen. I know and accept that traveling by vehicle (car, truck, SVU, fleeta, bus, taxi, etc.) has risks of serious injuries such as disability, being paralyzed, and death. I know and agree the risk of using [employer] is mine alone, and not [employer]. [employer] are provided as is and as are available. The transportation providers are in charge for all inspections and care of the vehicles used to provide these rides. I agree not to take legal action against [facility], its agents, employees, officers, directors, representatives, insurers, attorneys, assigns, successors, subsidiaries, and affiliates at any time for any reasons related directly or indirectly to using [employer]. I also agree not to take legal action against [facility] or its affiliates for any injury, death, or damage to property caused by or related to  using [employer]. I have read this Waiver and Release of Liability, and I understand the terms used in it and their legal meaning. This Waiver is freely and voluntarily given with the understanding that my right (or any legal minors) to legal action against [place] relating to [employer] is knowingly given up to use these services.   I attest that I read the Ride Waiver and Release of Liability to [name] [name], gave Mr. [place]ham the opportunity to ask questions and answered the questions asked (if any). I affirm that [name] [name] then provided consent for assistance with transportation.       Kahne Helfand, LCSWA 03/15/2024

## 2024-03-15 NOTE — Progress Notes (Signed)
 Collateral contact  [facility], [facility] ([facility]) [address] [place], [place], [place] [address] [phone]  CSW emailed a referral to [email or web address] via secure email.   Bora Broner, LCSWA 03/14/2024

## 2024-03-15 NOTE — Progress Notes (Signed)
   03/14/24 2116  Psych Admission Type (Psych Patients Only)  Admission Status Involuntary  Psychosocial Assessment  Patient Complaints None  Eye Contact Fair  Facial Expression Flat  Affect Preoccupied  Speech Logical/coherent  Interaction Minimal  Motor Activity Slow  Appearance/Hygiene In scrubs  Behavior Characteristics Cooperative;Calm  Mood Preoccupied  Thought Process  Coherency Blocking  Content WDL  Delusions None reported or observed  Perception Hallucinations  Hallucination Auditory  Judgment Impaired  Confusion None  Danger to Self  Current suicidal ideation? Denies  Danger to Others  Danger to Others None reported or observed

## 2024-03-15 NOTE — Plan of Care (Signed)
  Problem: Education: Goal: Emotional status will improve Outcome: Progressing Goal: Mental status will improve Outcome: Progressing Goal: Verbalization of understanding the information provided will improve Outcome: Progressing   Problem: Safety: Goal: Periods of time without injury will increase Outcome: Progressing   

## 2024-03-28 ENCOUNTER — Other Ambulatory Visit: Payer: Self-pay

## 2024-03-30 ENCOUNTER — Other Ambulatory Visit (HOSPITAL_COMMUNITY): Payer: Self-pay

## 2024-04-04 ENCOUNTER — Other Ambulatory Visit: Payer: Self-pay

## 2024-04-07 ENCOUNTER — Other Ambulatory Visit: Payer: Self-pay

## 2024-04-11 ENCOUNTER — Other Ambulatory Visit: Payer: Self-pay

## 2024-04-18 ENCOUNTER — Other Ambulatory Visit (HOSPITAL_COMMUNITY): Payer: Self-pay

## 2024-04-18 NOTE — Progress Notes (Signed)
 Patient has not responded to calls or MyChart messages. Dis-enrolling.
